# Patient Record
Sex: Male | Born: 1935 | Race: White | Hispanic: No | Smoking: Former smoker
Health system: Southern US, Community
[De-identification: ages and names within clinical notes are randomized; demographics above are authoritative.]

## PROBLEM LIST (undated history)

## (undated) DIAGNOSIS — I209 Angina pectoris, unspecified: Secondary | ICD-10-CM

## (undated) DIAGNOSIS — E119 Type 2 diabetes mellitus without complications: Secondary | ICD-10-CM

## (undated) DIAGNOSIS — T8859XA Other complications of anesthesia, initial encounter: Secondary | ICD-10-CM

## (undated) DIAGNOSIS — I38 Endocarditis, valve unspecified: Secondary | ICD-10-CM

## (undated) DIAGNOSIS — J189 Pneumonia, unspecified organism: Secondary | ICD-10-CM

## (undated) DIAGNOSIS — M1990 Unspecified osteoarthritis, unspecified site: Secondary | ICD-10-CM

## (undated) DIAGNOSIS — T4145XA Adverse effect of unspecified anesthetic, initial encounter: Secondary | ICD-10-CM

## (undated) DIAGNOSIS — L97509 Non-pressure chronic ulcer of other part of unspecified foot with unspecified severity: Secondary | ICD-10-CM

## (undated) DIAGNOSIS — I10 Essential (primary) hypertension: Secondary | ICD-10-CM

## (undated) DIAGNOSIS — D509 Iron deficiency anemia, unspecified: Secondary | ICD-10-CM

## (undated) DIAGNOSIS — Z9289 Personal history of other medical treatment: Secondary | ICD-10-CM

## (undated) DIAGNOSIS — I739 Peripheral vascular disease, unspecified: Secondary | ICD-10-CM

## (undated) DIAGNOSIS — E11621 Type 2 diabetes mellitus with foot ulcer: Secondary | ICD-10-CM

## (undated) DIAGNOSIS — E785 Hyperlipidemia, unspecified: Secondary | ICD-10-CM

## (undated) DIAGNOSIS — K219 Gastro-esophageal reflux disease without esophagitis: Secondary | ICD-10-CM

## (undated) HISTORY — DX: Essential (primary) hypertension: I10

## (undated) HISTORY — PX: CARDIAC VALVE REPLACEMENT: SHX585

## (undated) HISTORY — PX: OTHER SURGICAL HISTORY: SHX169

## (undated) HISTORY — DX: Gastro-esophageal reflux disease without esophagitis: K21.9

## (undated) HISTORY — PX: KNEE ARTHROSCOPY: SUR90

## (undated) HISTORY — DX: Endocarditis, valve unspecified: I38

## (undated) HISTORY — DX: Hyperlipidemia, unspecified: E78.5

## (undated) HISTORY — PX: AORTIC VALVE REPLACEMENT: SHX41

---

## 1997-09-24 ENCOUNTER — Encounter: Admission: RE | Admit: 1997-09-24 | Discharge: 1997-12-23 | Payer: Self-pay | Admitting: Internal Medicine

## 1998-03-21 ENCOUNTER — Encounter: Admission: RE | Admit: 1998-03-21 | Discharge: 1998-03-27 | Payer: Self-pay | Admitting: Internal Medicine

## 2004-09-28 ENCOUNTER — Ambulatory Visit: Payer: Self-pay | Admitting: Internal Medicine

## 2004-10-27 ENCOUNTER — Ambulatory Visit: Payer: Self-pay | Admitting: Internal Medicine

## 2004-11-03 ENCOUNTER — Ambulatory Visit: Payer: Self-pay | Admitting: Internal Medicine

## 2004-12-02 ENCOUNTER — Ambulatory Visit: Payer: Self-pay | Admitting: Gastroenterology

## 2004-12-14 ENCOUNTER — Ambulatory Visit: Payer: Self-pay | Admitting: Gastroenterology

## 2004-12-17 ENCOUNTER — Ambulatory Visit: Payer: Self-pay | Admitting: Gastroenterology

## 2005-05-14 ENCOUNTER — Ambulatory Visit: Payer: Self-pay | Admitting: Internal Medicine

## 2005-05-21 ENCOUNTER — Ambulatory Visit: Payer: Self-pay | Admitting: Internal Medicine

## 2005-08-16 ENCOUNTER — Ambulatory Visit: Payer: Self-pay | Admitting: Internal Medicine

## 2005-11-16 ENCOUNTER — Ambulatory Visit: Payer: Self-pay | Admitting: Internal Medicine

## 2006-03-04 ENCOUNTER — Ambulatory Visit: Payer: Self-pay | Admitting: Internal Medicine

## 2006-03-04 LAB — CONVERTED CEMR LAB
ALT: 15 units/L (ref 0–40)
AST: 24 units/L (ref 0–37)
Albumin: 3.2 g/dL — ABNORMAL LOW (ref 3.5–5.2)
Alkaline Phosphatase: 84 units/L (ref 39–117)
BUN: 15 mg/dL (ref 6–23)
Bilirubin, Direct: 0.2 mg/dL (ref 0.0–0.3)
CO2: 27 meq/L (ref 19–32)
Calcium: 10 mg/dL (ref 8.4–10.5)
Chloride: 105 meq/L (ref 96–112)
Chol/HDL Ratio, serum: 4.9
Cholesterol: 134 mg/dL (ref 0–200)
Creatinine, Ser: 1.1 mg/dL (ref 0.4–1.5)
GFR calc non Af Amer: 68 mL/min
Glomerular Filtration Rate, Af Am: 83 mL/min/{1.73_m2}
Glucose, Bld: 115 mg/dL — ABNORMAL HIGH (ref 70–99)
HDL: 27.5 mg/dL — ABNORMAL LOW (ref >39.0)
Hgb A1c MFr Bld: 8.3 % — ABNORMAL HIGH (ref 4.6–6.0)
LDL Cholesterol: 83 mg/dL (ref 0–99)
Potassium: 4.8 meq/L (ref 3.5–5.1)
Sodium: 140 meq/L (ref 135–145)
Total Bilirubin: 0.7 mg/dL (ref 0.3–1.2)
Total Protein: 6.7 g/dL (ref 6.0–8.3)
Triglyceride fasting, serum: 117 mg/dL (ref 0–149)
VLDL: 23 mg/dL (ref 0–40)

## 2006-03-14 ENCOUNTER — Encounter: Payer: Self-pay | Admitting: Cardiology

## 2006-03-14 ENCOUNTER — Ambulatory Visit: Payer: Self-pay

## 2006-06-17 ENCOUNTER — Ambulatory Visit: Payer: Self-pay | Admitting: Internal Medicine

## 2006-06-17 LAB — CONVERTED CEMR LAB
ALT: 16 units/L (ref 0–40)
AST: 24 units/L (ref 0–37)
Albumin: 3.3 g/dL — ABNORMAL LOW (ref 3.5–5.2)
Alkaline Phosphatase: 67 units/L (ref 39–117)
BUN: 16 mg/dL (ref 6–23)
Bilirubin, Direct: 0.1 mg/dL (ref 0.0–0.3)
CO2: 32 meq/L (ref 19–32)
Calcium: 9.3 mg/dL (ref 8.4–10.5)
Chloride: 105 meq/L (ref 96–112)
Cholesterol: 130 mg/dL (ref 0–200)
Creatinine, Ser: 0.9 mg/dL (ref 0.4–1.5)
Direct LDL: 57.7 mg/dL
GFR calc Af Amer: 104 mL/min
GFR calc non Af Amer: 86 mL/min
Glucose, Bld: 189 mg/dL — ABNORMAL HIGH (ref 70–99)
HDL: 26.8 mg/dL — ABNORMAL LOW (ref >39.0)
Hgb A1c MFr Bld: 7.2 % — ABNORMAL HIGH (ref 4.6–6.0)
Potassium: 4.7 meq/L (ref 3.5–5.1)
Sodium: 141 meq/L (ref 135–145)
Total Bilirubin: 0.6 mg/dL (ref 0.3–1.2)
Total CHOL/HDL Ratio: 4.9
Total Protein: 6.2 g/dL (ref 6.0–8.3)
Triglycerides: 242 mg/dL (ref 0–149)
VLDL: 48 mg/dL — ABNORMAL HIGH (ref 0–40)

## 2007-01-25 ENCOUNTER — Ambulatory Visit: Payer: Self-pay | Admitting: Internal Medicine

## 2007-01-25 DIAGNOSIS — I10 Essential (primary) hypertension: Secondary | ICD-10-CM | POA: Insufficient documentation

## 2007-01-25 DIAGNOSIS — E119 Type 2 diabetes mellitus without complications: Secondary | ICD-10-CM | POA: Insufficient documentation

## 2007-01-25 DIAGNOSIS — Z954 Presence of other heart-valve replacement: Secondary | ICD-10-CM | POA: Insufficient documentation

## 2007-01-29 DIAGNOSIS — K219 Gastro-esophageal reflux disease without esophagitis: Secondary | ICD-10-CM

## 2007-02-08 LAB — CONVERTED CEMR LAB
ALT: 15 units/L (ref 0–53)
AST: 25 units/L (ref 0–37)
Albumin: 3.6 g/dL (ref 3.5–5.2)
Alkaline Phosphatase: 64 units/L (ref 39–117)
BUN: 28 mg/dL — ABNORMAL HIGH (ref 6–23)
Bilirubin, Direct: 0.1 mg/dL (ref 0.0–0.3)
CO2: 29 meq/L (ref 19–32)
Calcium: 10.1 mg/dL (ref 8.4–10.5)
Chloride: 110 meq/L (ref 96–112)
Cholesterol: 125 mg/dL (ref 0–200)
Creatinine, Ser: 1.1 mg/dL (ref 0.4–1.5)
GFR calc Af Amer: 82 mL/min
GFR calc non Af Amer: 68 mL/min
Glucose, Bld: 114 mg/dL — ABNORMAL HIGH (ref 70–99)
HDL: 21.8 mg/dL — ABNORMAL LOW (ref >39.0)
Hgb A1c MFr Bld: 7.1 % — ABNORMAL HIGH (ref 4.6–6.0)
LDL Cholesterol: 74 mg/dL (ref 0–99)
Potassium: 4.7 meq/L (ref 3.5–5.1)
Sodium: 147 meq/L — ABNORMAL HIGH (ref 135–145)
TSH: 2.28 microintl units/mL (ref 0.35–5.50)
Total Bilirubin: 0.6 mg/dL (ref 0.3–1.2)
Total CHOL/HDL Ratio: 5.7
Total Protein: 6.7 g/dL (ref 6.0–8.3)
Triglycerides: 144 mg/dL (ref 0–149)
VLDL: 29 mg/dL (ref 0–40)

## 2007-10-13 ENCOUNTER — Ambulatory Visit: Payer: Self-pay | Admitting: Internal Medicine

## 2007-10-18 LAB — CONVERTED CEMR LAB
AST: 23 units/L (ref 0–37)
Calcium: 9.4 mg/dL (ref 8.4–10.5)
Creatinine, Ser: 1 mg/dL (ref 0.4–1.5)
GFR calc Af Amer: 92 mL/min
GFR calc non Af Amer: 76 mL/min
HDL: 23.1 mg/dL — ABNORMAL LOW (ref >39.0)
Hgb A1c MFr Bld: 6.9 % — ABNORMAL HIGH (ref 4.6–6.0)
LDL Cholesterol: 59 mg/dL (ref 0–99)
Sodium: 139 meq/L (ref 135–145)
Total CHOL/HDL Ratio: 5.1
Triglycerides: 176 mg/dL — ABNORMAL HIGH (ref 0–149)
VLDL: 35 mg/dL (ref 0–40)

## 2007-12-12 ENCOUNTER — Telehealth: Payer: Self-pay | Admitting: Internal Medicine

## 2008-01-25 ENCOUNTER — Telehealth: Payer: Self-pay | Admitting: Internal Medicine

## 2008-02-26 ENCOUNTER — Ambulatory Visit: Payer: Self-pay | Admitting: Internal Medicine

## 2008-02-26 DIAGNOSIS — E785 Hyperlipidemia, unspecified: Secondary | ICD-10-CM

## 2008-02-27 LAB — CONVERTED CEMR LAB
AST: 26 units/L (ref 0–37)
CO2: 27 meq/L (ref 19–32)
Chloride: 109 meq/L (ref 96–112)
Creatinine, Ser: 0.8 mg/dL (ref 0.4–1.5)
GFR calc non Af Amer: 98 mL/min
Hgb A1c MFr Bld: 6.4 % — ABNORMAL HIGH (ref 4.6–6.0)
LDL Cholesterol: 77 mg/dL (ref 0–99)
Sodium: 144 meq/L (ref 135–145)
Total CHOL/HDL Ratio: 4.8
Triglycerides: 128 mg/dL (ref 0–149)

## 2008-04-01 ENCOUNTER — Encounter: Payer: Self-pay | Admitting: Internal Medicine

## 2008-07-01 ENCOUNTER — Ambulatory Visit: Payer: Self-pay | Admitting: Internal Medicine

## 2008-07-01 LAB — HM COLONOSCOPY

## 2008-07-02 LAB — CONVERTED CEMR LAB
ALT: 17 units/L (ref 0–53)
CO2: 30 meq/L (ref 19–32)
Calcium: 9.2 mg/dL (ref 8.4–10.5)
Creatinine, Ser: 1 mg/dL (ref 0.4–1.5)
GFR calc non Af Amer: 75.63 mL/min (ref >60)
Glucose, Bld: 138 mg/dL — ABNORMAL HIGH (ref 70–99)
HDL: 25.4 mg/dL — ABNORMAL LOW (ref >39.00)
Hgb A1c MFr Bld: 7.5 % — ABNORMAL HIGH (ref 4.6–6.5)
Sodium: 143 meq/L (ref 135–145)
Total CHOL/HDL Ratio: 5
Triglycerides: 181 mg/dL — ABNORMAL HIGH (ref 0.0–149.0)

## 2008-10-31 ENCOUNTER — Ambulatory Visit: Payer: Self-pay | Admitting: Internal Medicine

## 2008-11-06 LAB — CONVERTED CEMR LAB
ALT: 15 units/L (ref 0–53)
AST: 26 units/L (ref 0–37)
Albumin: 3.6 g/dL (ref 3.5–5.2)
HDL: 24.7 mg/dL — ABNORMAL LOW (ref >39.00)
Hgb A1c MFr Bld: 7.6 % — ABNORMAL HIGH (ref 4.6–6.5)
TSH: 1.87 microintl units/mL (ref 0.35–5.50)
Total Protein: 6.8 g/dL (ref 6.0–8.3)
Triglycerides: 113 mg/dL (ref 0.0–149.0)
VLDL: 22.6 mg/dL (ref 0.0–40.0)

## 2009-01-28 ENCOUNTER — Encounter (INDEPENDENT_AMBULATORY_CARE_PROVIDER_SITE_OTHER): Payer: Self-pay | Admitting: *Deleted

## 2009-02-21 ENCOUNTER — Encounter (INDEPENDENT_AMBULATORY_CARE_PROVIDER_SITE_OTHER): Payer: Self-pay | Admitting: *Deleted

## 2009-02-24 ENCOUNTER — Encounter (INDEPENDENT_AMBULATORY_CARE_PROVIDER_SITE_OTHER): Payer: Self-pay | Admitting: *Deleted

## 2009-02-24 ENCOUNTER — Ambulatory Visit: Payer: Self-pay | Admitting: Internal Medicine

## 2009-02-24 ENCOUNTER — Telehealth: Payer: Self-pay | Admitting: Internal Medicine

## 2009-03-17 ENCOUNTER — Encounter: Payer: Self-pay | Admitting: Internal Medicine

## 2009-03-17 LAB — CONVERTED CEMR LAB
BUN: 35 mg/dL — ABNORMAL HIGH (ref 6–23)
Basophils Relative: 0.7 % (ref 0.0–3.0)
Calcium: 9.6 mg/dL (ref 8.4–10.5)
Cholesterol: 131 mg/dL (ref 0–200)
Creatinine, Ser: 1.4 mg/dL (ref 0.4–1.5)
Direct LDL: 70.2 mg/dL
Eosinophils Relative: 2.3 % (ref 0.0–5.0)
GFR calc non Af Amer: 51.21 mL/min (ref >60)
Glucose, Bld: 110 mg/dL — ABNORMAL HIGH (ref 70–99)
HCT: 29.4 % — ABNORMAL LOW (ref 39.0–52.0)
Hemoglobin: 9.8 g/dL — ABNORMAL LOW (ref 13.0–17.0)
MCV: 96.7 fL (ref 78.0–100.0)
Monocytes Absolute: 0.3 10*3/uL (ref 0.1–1.0)
Neutro Abs: 1.9 10*3/uL (ref 1.4–7.7)
Neutrophils Relative %: 56.2 % (ref 43.0–77.0)
RBC: 3.04 M/uL — ABNORMAL LOW (ref 4.22–5.81)
VLDL: 43.8 mg/dL — ABNORMAL HIGH (ref 0.0–40.0)
WBC: 3.3 10*3/uL — ABNORMAL LOW (ref 4.5–10.5)

## 2009-03-19 ENCOUNTER — Telehealth: Payer: Self-pay | Admitting: Internal Medicine

## 2009-06-27 ENCOUNTER — Ambulatory Visit: Payer: Self-pay | Admitting: Internal Medicine

## 2009-07-01 LAB — CONVERTED CEMR LAB
ALT: 14 units/L (ref 0–53)
CO2: 27 meq/L (ref 19–32)
Calcium: 9.4 mg/dL (ref 8.4–10.5)
GFR calc non Af Amer: 67.59 mL/min (ref >60)
HDL: 29.2 mg/dL — ABNORMAL LOW (ref >39.00)
LDL Cholesterol: 70 mg/dL (ref 0–99)
Sodium: 140 meq/L (ref 135–145)
TSH: 2.22 microintl units/mL (ref 0.35–5.50)
Total Bilirubin: 0.6 mg/dL (ref 0.3–1.2)
Total Protein: 6.7 g/dL (ref 6.0–8.3)
VLDL: 26.8 mg/dL (ref 0.0–40.0)

## 2009-09-26 ENCOUNTER — Ambulatory Visit: Payer: Self-pay | Admitting: Internal Medicine

## 2009-09-30 LAB — CONVERTED CEMR LAB
Calcium: 9.2 mg/dL (ref 8.4–10.5)
GFR calc non Af Amer: 70.5 mL/min (ref >60)
HDL: 23.6 mg/dL — ABNORMAL LOW (ref >39.00)
Hgb A1c MFr Bld: 8.4 % — ABNORMAL HIGH (ref 4.6–6.5)
Sodium: 140 meq/L (ref 135–145)
Triglycerides: 140 mg/dL (ref 0.0–149.0)

## 2009-12-30 ENCOUNTER — Telehealth: Payer: Self-pay | Admitting: Internal Medicine

## 2010-01-30 ENCOUNTER — Ambulatory Visit: Payer: Self-pay | Admitting: Internal Medicine

## 2010-02-02 LAB — CONVERTED CEMR LAB
ALT: 13 units/L (ref 0–53)
CO2: 27 meq/L (ref 19–32)
Calcium: 9 mg/dL (ref 8.4–10.5)
Chloride: 104 meq/L (ref 96–112)
Eosinophils Relative: 1.8 % (ref 0.0–5.0)
Glucose, Bld: 79 mg/dL (ref 70–99)
HCT: 28.4 % — ABNORMAL LOW (ref 39.0–52.0)
HDL: 24.4 mg/dL — ABNORMAL LOW (ref >39.00)
Hemoglobin: 9.6 g/dL — ABNORMAL LOW (ref 13.0–17.0)
Lymphs Abs: 1 10*3/uL (ref 0.7–4.0)
Monocytes Relative: 9.7 % (ref 3.0–12.0)
Platelets: 74 10*3/uL — ABNORMAL LOW (ref 150.0–400.0)
Sodium: 139 meq/L (ref 135–145)
TSH: 3.3 microintl units/mL (ref 0.35–5.50)
Total Bilirubin: 0.5 mg/dL (ref 0.3–1.2)
Total CHOL/HDL Ratio: 5
VLDL: 25.8 mg/dL (ref 0.0–40.0)
WBC: 3.4 10*3/uL — ABNORMAL LOW (ref 4.5–10.5)

## 2010-04-16 NOTE — Assessment & Plan Note (Signed)
Summary: 4 MONTH FUP//CCM   Vital Signs:  Patient profile:   77 year old male Weight:      166 pounds Temp:     97.7 degrees F oral Pulse rate:   72 / minute Pulse rhythm:   regular BP sitting:   112 / 60  (left arm) Cuff size:   regular  Vitals Entered By: Alfred Levins, CMA (January 30, 2010 9:26 AM) CC: DM f/u Is Patient Diabetic? Yes Did you bring your meter with you today? No   CC:  DM f/u.  History of Present Illness:  Follow-Up Visit      This is an 77 year old man who presents for Follow-up visit.  The patient denies chest pain and palpitations.  Since the last visit the patient notes no new problems or concerns.  The patient reports taking meds as prescribed.  When questioned about possible medication side effects, the patient notes none.  not checking CBGs, not following a diet or exercise plan.  All other systems reviewed and were negative except has chronic problem with constipation.   Current Problems (verified): 1)  Hyperlipidemia  (ICD-272.4) 2)  Aortic Valve Replacement, Hx of  (ICD-V43.3) 3)  Gerd  (ICD-530.81) 4)  Hypertension  (ICD-401.9) 5)  Diabetes Mellitus, Type II  (ICD-250.00)  Current Medications (verified): 1)  Baclofen 10 Mg Tabs (Baclofen) .... Take 1 Tablet By Mouth At Bedtime 2)  Lanoxin 0.125 Mg  Tabs (Digoxin) .... Take 1 Tablet By Mouth Once A Day 3)  Glipizide 10 Mg Tabs (Glipizide) .... Take 1 Tablet By Mouth Once A Day 4)  Lasix 40 Mg Tabs (Furosemide) .... Take 1 Tablet By Mouth Once A Day 5)  Lisinopril 20 Mg Tabs (Lisinopril) .... Take 1 Tablet By Mouth Once A Day 6)  Metformin Hcl 500 Mg Tabs (Metformin Hcl) .... Take 1 Tablet By Mouth Two Times A Day 7)  Zantac 150 Mg Tabs (Ranitidine Hcl) .... Take 1 Tablet By Mouth Once A Day 8)  Terazosin Hcl 10 Mg  Caps (Terazosin Hcl) .... At Bedtime 9)  Hydrocodone-Acetaminophen 5-500 Mg Tabs (Hydrocodone-Acetaminophen) .... Take 1 Tablet By Mouth Two Times A Day As Needed 10)  Aspirin 325  Mg Tabs (Aspirin) .... Once Daily 11)  Ferrous Sulfate 325 (65 Fe) Mg Tabs (Ferrous Sulfate) .... 2 Once Daily 12)  Vitamin C 500 Mg Tabs (Ascorbic Acid) .... Once Daily 13)  Vitamin E Natural 400 Unit Caps (Vitamin E) .... 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 HEENT exam atraumatic, normocephalic supple without lymphadenopathy. Chest clear to auscultation cardiac exam S1-S2 are regular. Extremities no clubbing cyanosis or edema. Neurologic exam he is alert is oriented to name, place, date and  He anulus with a broad-based gait..   Impression & Recommendations:  Problem # 1:  HYPERLIPIDEMIA (ICD-272.4)  note low HDL Labs Reviewed: SGOT: 22 (06/27/2009)   SGPT: 15 (09/26/2009)   HDL:23.60 (09/26/2009), 29.20 (06/27/2009)  LDL:64 (09/26/2009), 70 (06/27/2009)  Chol:116 (09/26/2009), 126 (06/27/2009)  Trig:140.0 (09/26/2009), 134.0 (06/27/2009)  Orders: Venipuncture (09811)  Specimen Handling (16109) TLB-Lipid Panel (80061-LIPID) TLB-Hepatic/Liver Function Pnl (80076-HEPATIC) TLB-TSH (Thyroid Stimulating Hormone) (84443-TSH)  Problem # 2:  AORTIC VALVE REPLACEMENT, HX OF (ICD-V43.3) no eval necessary  Problem # 3:  HYPERTENSION (ICD-401.9)  controlled continue current medications  His updated medication list for this problem includes:    Lasix 40 Mg Tabs (Furosemide) .Marland Kitchen... Take 1 tablet by mouth once a day    Lisinopril 20 Mg Tabs (Lisinopril) .Marland Kitchen... Take 1 tablet  by mouth once a day    Terazosin Hcl 10 Mg Caps (Terazosin hcl) .Marland Kitchen... At bedtime  BP today: 112/60 Prior BP: 110/58 (09/26/2009)  Labs Reviewed: K+: 4.6 (09/26/2009) Creat: : 1.1 (09/26/2009)   Chol: 116 (09/26/2009)   HDL: 23.60 (09/26/2009)   LDL: 64 (09/26/2009)   TG: 140.0 (09/26/2009)  Orders: Specimen Handling (60454) TLB-BMP (Basic Metabolic Panel-BMET) (80048-METABOL)  Problem # 4:  DIABETES MELLITUS, TYPE II (ICD-250.00)  advised low CHO diet and daily walking His updated medication list for this problem includes:    Glipizide 10 Mg Tabs (Glipizide) .Marland Kitchen... Take 1 tablet by mouth once a day    Lisinopril 20 Mg Tabs (Lisinopril) .Marland Kitchen... Take 1 tablet by mouth once a day    Metformin Hcl 500 Mg Tabs (Metformin hcl) .Marland Kitchen... Take 1 tablet by mouth two times a day    Aspirin 325 Mg Tabs (Aspirin) ..... Once daily  Labs Reviewed: Creat: 1.1 (09/26/2009)     Last Eye Exam: normal-pt's report (03/15/2008) Reviewed HgBA1c results: 8.4 (09/26/2009)  6.9 (06/27/2009)  Orders: Specimen Handling (09811) TLB-A1C / Hgb A1C (Glycohemoglobin) (83036-A1C)  Complete Medication List: 1)  Baclofen 10 Mg Tabs (Baclofen) .... Take 1 tablet by mouth at bedtime 2)  Lanoxin 0.125 Mg Tabs (Digoxin) .... Take 1 tablet by mouth once a day 3)  Glipizide 10 Mg Tabs (Glipizide) .... Take 1 tablet by mouth once a day 4)  Lasix 40 Mg Tabs (Furosemide) .... Take 1 tablet by mouth once a day 5)  Lisinopril 20 Mg Tabs (Lisinopril) .... Take 1 tablet by mouth once a day 6)  Metformin Hcl 500 Mg Tabs (Metformin hcl) .... Take 1 tablet by mouth two times a day 7)  Zantac 150 Mg Tabs (Ranitidine hcl) .... Take 1 tablet by mouth once a day 8)  Terazosin Hcl 10 Mg Caps (Terazosin hcl) .... At bedtime 9)  Hydrocodone-acetaminophen 5-500 Mg Tabs (Hydrocodone-acetaminophen) .... Take 1 tablet by mouth two times a day as needed 10)  Aspirin 325 Mg Tabs (Aspirin) .... Once daily 11)  Ferrous Sulfate 325 (65 Fe) Mg  Tabs (Ferrous sulfate) .... 2 once daily 12)  Vitamin C 500 Mg Tabs (Ascorbic acid) .... Once daily 13)  Vitamin E Natural 400 Unit Caps (Vitamin e) .... Once daily  Other Orders: TLB-CBC Platelet - w/Differential (85025-CBCD)  Patient Instructions: 1)  Please schedule a follow-up appointment in 4 months.   Orders Added: 1)  Est. Patient Level IV [91478] 2)  Venipuncture [29562] 3)  Specimen Handling [99000] 4)  TLB-Lipid Panel [80061-LIPID] 5)  TLB-Hepatic/Liver Function Pnl [80076-HEPATIC] 6)  TLB-TSH (Thyroid Stimulating Hormone) [84443-TSH] 7)  TLB-CBC Platelet - w/Differential [85025-CBCD] 8)  TLB-BMP (Basic Metabolic Panel-BMET) [80048-METABOL] 9)  TLB-A1C / Hgb A1C (Glycohemoglobin) [83036-A1C]

## 2010-04-16 NOTE — Assessment & Plan Note (Signed)
Summary: 4 month rov/pt will come in fasting/njr   Vital Signs:  Patient profile:   77 year old male Weight:      173 pounds Pulse rate:   70 / minute Pulse rhythm:   regular Resp:     15 per minute BP sitting:   110 / 70  Vitals Entered By: Gladis Riffle, RN (June 27, 2009 9:54 AM)  History of Present Illness:  Follow-Up Visit      This is an 77 year old man who presents for Follow-up visit.  The patient denies chest pain and palpitations.  Since the last visit the patient notes no new problems or concerns.  The patient reports taking meds as prescribed and not taking meds as prescribed.  When questioned about possible medication side effects, the patient notes none.    All other systems reviewed and were negative   Current Problems (verified): 1)  Hyperlipidemia  (ICD-272.4) 2)  Aortic Valve Replacement, Hx of  (ICD-V43.3) 3)  Gerd  (ICD-530.81) 4)  Hypertension  (ICD-401.9) 5)  Diabetes Mellitus, Type II  (ICD-250.00)  Current Medications (verified): 1)  Baclofen 10 Mg Tabs (Baclofen) .... Take 1 Tablet By Mouth At Bedtime 2)  Lanoxin 0.125 Mg  Tabs (Digoxin) .... Take 1 Tablet By Mouth Once A Day 3)  Glipizide 10 Mg Tabs (Glipizide) .... Take 1 Tablet By Mouth Once A Day 4)  Lasix 40 Mg Tabs (Furosemide) .... Take 1 Tablet By Mouth Once A Day 5)  Lisinopril 20 Mg Tabs (Lisinopril) .... Take 1 Tablet By Mouth Once A Day 6)  Metformin Hcl 500 Mg Tabs (Metformin Hcl) .... Take 1 Tablet By Mouth Two Times A Day 7)  Zantac 150 Mg Tabs (Ranitidine Hcl) .... Take 1 Tablet By Mouth Once A Day 8)  Terazosin Hcl 10 Mg  Caps (Terazosin Hcl) .... At Bedtime 9)  Hydrocodone-Acetaminophen 5-500 Mg Tabs (Hydrocodone-Acetaminophen) .... Take 1/2 Tablet By Mouth Two Times A Day As Needed Pain 10)  Aspirin 325 Mg Tabs (Aspirin) .... Once Daily 11)  Ferrous Sulfate 325 (65 Fe) Mg Tabs (Ferrous Sulfate) .... 2 Once Daily 12)  Vitamin C 500 Mg Tabs (Ascorbic Acid) .... Once Daily 13)  Vitamin E  Natural 400 Unit Caps (Vitamin E) .... 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   Head:  normocephalic and atraumatic.   Eyes:  pupils equal and pupils round.   Ears:  R ear normal and L ear normal.   Neck:  No deformities, masses, or tenderness noted. Chest Wall:  No deformities, masses, tenderness or gynecomastia noted. Lungs:  Normal respiratory effort, chest expands symmetrically. Lungs are clear to auscultation, no crackles or wheezes. Heart:  normal rate and regular rhythm.   Abdomen:  Bowel sounds positive,abdomen soft and non-tender without masses, organomegaly or hernias noted. Msk:  No deformity or scoliosis noted of thoracic or lumbar spine.   Neurologic:  cranial nerves II-XII intact and gait normal.     Impression & Recommendations:  Problem # 1:  HYPERLIPIDEMIA (ICD-272.4)  check labs today never treated  Labs Reviewed: SGOT: 26 (10/31/2008)   SGPT: 16 (02/24/2009)   HDL:22.40 (02/24/2009), 24.70 (10/31/2008)  LDL:65 (10/31/2008), 68 (07/01/2008)  Chol:131 (02/24/2009), 112 (10/31/2008)  Trig:219.0 (02/24/2009), 113.0 (10/31/2008)  Orders: Venipuncture (60454) TLB-Lipid Panel (80061-LIPID) TLB-Hepatic/Liver Function Pnl (80076-HEPATIC) TLB-TSH (Thyroid Stimulating Hormone) (84443-TSH)  Problem # 2:  DIABETES MELLITUS, TYPE II (ICD-250.00)  continue current medications check albs His updated medication list for this problem includes:    Glipizide 10 Mg  Tabs (Glipizide) .Marland Kitchen... Take 1 tablet by mouth once a day    Lisinopril 20 Mg Tabs (Lisinopril) .Marland Kitchen... Take 1 tablet by mouth once a day    Metformin Hcl 500 Mg Tabs (Metformin hcl) .Marland Kitchen... Take 1 tablet by mouth two times a day    Aspirin 325 Mg Tabs (Aspirin) ..... Once daily  Labs Reviewed: Creat: 1.4 (02/24/2009)     Last Eye Exam: normal-pt's report (03/15/2008) Reviewed HgBA1c results: 7.3 (02/24/2009)  7.6 (10/31/2008)  Orders: TLB-BMP (Basic Metabolic Panel-BMET) (80048-METABOL) TLB-A1C / Hgb A1C (Glycohemoglobin) (83036-A1C)  Problem # 3:  GERD (ICD-530.81) controlled on meds His updated medication list for this problem includes:    Zantac 150 Mg Tabs (Ranitidine hcl) .Marland Kitchen... Take 1 tablet by mouth once a day  Complete Medication List: 1)  Baclofen 10 Mg Tabs (Baclofen) .... Take 1 tablet by mouth at bedtime 2)  Lanoxin 0.125 Mg Tabs (Digoxin) .... Take 1 tablet by mouth once a day 3)  Glipizide 10 Mg Tabs (Glipizide) .... Take 1 tablet by mouth once a day 4)  Lasix 40 Mg Tabs (Furosemide) .... Take 1 tablet by mouth once a day 5)  Lisinopril 20 Mg Tabs (Lisinopril) .... Take 1 tablet by mouth once a day 6)  Metformin Hcl 500 Mg Tabs (Metformin hcl) .... Take 1 tablet by mouth two times a day 7)  Zantac 150 Mg Tabs (Ranitidine hcl) .... Take 1 tablet by mouth once a day 8)  Terazosin Hcl 10 Mg Caps (Terazosin hcl) .... At bedtime 9)  Hydrocodone-acetaminophen 5-500 Mg Tabs (Hydrocodone-acetaminophen) .... Take 1/2 tablet by mouth two times a day as needed pain 10)  Aspirin 325 Mg Tabs (Aspirin) .... Once daily 11)  Ferrous Sulfate 325 (65 Fe) Mg Tabs (Ferrous sulfate) .... 2 once daily 12)  Vitamin C 500 Mg Tabs (Ascorbic acid) .... Once daily 13)  Vitamin E Natural 400 Unit Caps (Vitamin e) .... Once daily  Patient Instructions: 1)  Please schedule a follow-up appointment in 3 months.

## 2010-04-16 NOTE — Progress Notes (Signed)
Summary: clarify sig  Phone Note From Pharmacy Call back at [phone]   Summary of Call: terozosin Caller: Charles @ [facility] in [place] Summary of Call: received escript for Terozosin 10mg  cap #100 . Verify how many at bedtime? Initial call taken by: Warnell Forester,  December 30, 2009 3:30 PM  Follow-up for Phone Call        Returned pharmacy call. Follow-up by: Lynann Beaver CMA,  December 30, 2009 3:42 PM

## 2010-04-16 NOTE — Progress Notes (Signed)
Summary: REQ FOR MED  Phone Note Call from Patient   Caller: Daughter-n-law @ [phone] Reason for Call: Talk to Nurse, Talk to Doctor Summary of Call: Pts dghtr-n-lw Bethann Berkshire) called to req that alternative to Darvocet be called sent in to [facility] in [place], [place] @ [phone].... Mrs dilon lank that any questions or concerns, she can be reached @ [phone].  Also, would like to know if documents have been sent in to [facility].... [phone].  Initial call taken by: Debbra Riding,  March 19, 2009 10:59 AM  Follow-up for Phone Call        see rx Follow-up by: Birdie Sons MD,  March 19, 2009 7:36 PM  Additional Follow-up for Phone Call Additional follow up Details #1::        Rx telephoned in to [facility].  documents were sent 12/13 10.   (daughter in law)amie notified. Additional Follow-up by: Gladis Riffle, RN,  March 20, 2009 3:42 PM    New/Updated Medications: HYDROCODONE-ACETAMINOPHEN 2.5-500 MG TABS (HYDROCODONE-ACETAMINOPHEN) two times a day tab as needed pain Prescriptions: HYDROCODONE-ACETAMINOPHEN 2.5-500 MG TABS (HYDROCODONE-ACETAMINOPHEN) two times a day tab as needed pain  #30 x 3   Entered and Authorized by:   Birdie Sons MD   Signed by:   Birdie Sons MD on 03/19/2009   Method used:   Telephoned to ...       [facility] [phone]* (retail)       [address]       [place], [place]  [address]       Ph: [phone]       Fax: [phone]   RxID:   [id number]

## 2010-04-16 NOTE — Assessment & Plan Note (Signed)
Summary: 3 month fup//ccm   Vital Signs:  Patient profile:   77 year old male Height:      66 inches (167.64 cm) Weight:      167.31 pounds (76.05 kg) BMI:     27.10 Temp:     97.6 degrees F (36.44 degrees C) oral Pulse rate:   62 / minute BP sitting:   110 / 58  (left arm) Cuff size:   regular  Vitals Entered By: Josph Macho RMA (September 26, 2009 9:32 AM) CC: 3 month follow up/ CF Is Patient Diabetic? Yes   CC:  3 month follow up/ CF.  History of Present Illness:  Follow-Up Visit      This is an 77 year old man who presents for Follow-up visit.  The patient denies chest pain and palpitations.  Since the last visit the patient notes no new problems or concerns.  The patient reports taking meds as prescribed.  When questioned about possible medication side effects, the patient notes none.    All other systems reviewed and were negative   Current Problems (verified): 1)  Hyperlipidemia  (ICD-272.4) 2)  Aortic Valve Replacement, Hx of  (ICD-V43.3) 3)  Gerd  (ICD-530.81) 4)  Hypertension  (ICD-401.9) 5)  Diabetes Mellitus, Type II  (ICD-250.00)  Current Medications (verified): 1)  Baclofen 10 Mg Tabs (Baclofen) .... Take 1 Tablet By Mouth At Bedtime 2)  Lanoxin 0.125 Mg  Tabs (Digoxin) .... Take 1 Tablet By Mouth Once A Day 3)  Glipizide 10 Mg Tabs (Glipizide) .... Take 1 Tablet By Mouth Once A Day 4)  Lasix 40 Mg Tabs (Furosemide) .... Take 1 Tablet By Mouth Once A Day 5)  Lisinopril 20 Mg Tabs (Lisinopril) .... Take 1 Tablet By Mouth Once A Day 6)  Metformin Hcl 500 Mg Tabs (Metformin Hcl) .... Take 1 Tablet By Mouth Two Times A Day 7)  Zantac 150 Mg Tabs (Ranitidine Hcl) .... Take 1 Tablet By Mouth Once A Day 8)  Terazosin Hcl 10 Mg  Caps (Terazosin Hcl) .... At Bedtime 9)  Hydrocodone-Acetaminophen 5-500 Mg Tabs (Hydrocodone-Acetaminophen) .... Take 1/2 Tablet By Mouth Two Times A Day As Needed Pain 10)  Aspirin 325 Mg Tabs (Aspirin) .... Once Daily 11)  Ferrous Sulfate  325 (65 Fe) Mg Tabs (Ferrous Sulfate) .... 2 Once Daily 12)  Vitamin C 500 Mg Tabs (Ascorbic Acid) .... Once Daily 13)  Vitamin E Natural 400 Unit Caps (Vitamin E) .... 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   Head:  normocephalic and atraumatic.   Eyes:  pupils equal and pupils round.   Ears:  R ear normal and L ear normal.   Neck:  No deformities, masses, or tenderness noted. Chest Wall:  No deformities, masses, tenderness or gynecomastia noted. Lungs:  Normal respiratory effort, chest expands symmetrically. Lungs are clear to auscultation, no crackles or wheezes. Heart:  normal rate and regular rhythm.   Abdomen:  Bowel sounds positive,abdomen soft and non-tender without masses, organomegaly or hernias noted. Skin:  turgor normal and color normal.  Psych:  memory intact for recent and remote and normally interactive.     Impression & Recommendations:  Problem # 1:  DIABETES MELLITUS, TYPE II (ICD-250.00)  His updated medication list for this problem includes:    Glipizide 10 Mg Tabs (Glipizide) .Marland Kitchen... Take 1 tablet by mouth once a day    Lisinopril 20 Mg Tabs (Lisinopril) .Marland Kitchen... Take 1 tablet by mouth once a day    Metformin Hcl 500 Mg Tabs (Metformin hcl) .Marland Kitchen... Take 1 tablet by mouth two times a day    Aspirin 325 Mg Tabs (Aspirin) ..... Once daily  Labs Reviewed: Creat: 1.1 (06/27/2009)     Last Eye Exam: normal-pt's report (03/15/2008) Reviewed HgBA1c results: 6.9  (06/27/2009)  7.3 (02/24/2009)  Orders: TLB-A1C / Hgb A1C (Glycohemoglobin) (83036-A1C) TLB-BMP (Basic Metabolic Panel-BMET) (80048-METABOL) TLB-Lipid Panel (80061-LIPID) TLB-ALT (SGPT) (84460-ALT)  Problem # 2:  AORTIC VALVE REPLACEMENT, HX OF (ICD-V43.3) clinically stable  Problem # 3:  HYPERTENSION (ICD-401.9)  His updated medication list for this problem includes:    Lasix 40 Mg Tabs (Furosemide) .Marland Kitchen... Take 1 tablet by mouth once a day    Lisinopril 20 Mg Tabs (Lisinopril) .Marland Kitchen... Take 1 tablet by mouth once a day    Terazosin Hcl 10 Mg Caps (Terazosin hcl) .Marland Kitchen... At bedtime  BP today: 110/58 Prior BP: 110/70 (06/27/2009)  Labs Reviewed: K+: 4.8 (06/27/2009) Creat: : 1.1 (06/27/2009)   Chol: 126 (06/27/2009)   HDL: 29.20 (06/27/2009)   LDL: 70 (06/27/2009)   TG: 134.0 (06/27/2009)  Problem # 4:  GERD (ICD-530.81) controlled, continue current medications  His updated medication list for this problem includes:    Zantac 150 Mg Tabs (Ranitidine hcl) .Marland Kitchen... Take 1 tablet by mouth once a day     Complete Medication List: 1)  Baclofen 10 Mg Tabs (Baclofen) .... Take 1 tablet by mouth at bedtime 2)  Lanoxin 0.125 Mg Tabs (Digoxin) .... Take 1 tablet by mouth once a day 3)  Glipizide 10 Mg Tabs (Glipizide) .... Take 1 tablet by mouth once a day 4)  Lasix 40 Mg Tabs (Furosemide) .... Take 1 tablet by mouth once a day 5)  Lisinopril 20 Mg Tabs (Lisinopril) .... Take 1 tablet by mouth once a day 6)  Metformin Hcl 500 Mg Tabs (Metformin hcl) .... Take 1 tablet by mouth two times a day 7)  Zantac 150 Mg Tabs (Ranitidine hcl) .... Take 1 tablet by mouth once a day 8)  Terazosin Hcl 10 Mg Caps (Terazosin hcl) .... At bedtime 9)  Hydrocodone-acetaminophen 5-500 Mg Tabs (Hydrocodone-acetaminophen) .... Take 1 tablet by mouth two times a day as needed 10)  Aspirin 325 Mg Tabs (Aspirin) .... Once daily 11)  Ferrous Sulfate 325 (65 Fe) Mg Tabs (Ferrous sulfate) .... 2 once daily 12)  Vitamin  C 500 Mg Tabs (Ascorbic acid) .... Once daily 13)  Vitamin E Natural 400 Unit Caps (Vitamin e) .... Once daily  Other Orders: Venipuncture (45409) Prescriptions: HYDROCODONE-ACETAMINOPHEN 5-500 MG TABS (HYDROCODONE-ACETAMINOPHEN) Take 1 tablet by mouth two times a day as needed  #20 x 3   Entered and Authorized by:   Birdie Sons MD   Signed by:   Birdie Sons MD on 09/26/2009   Method used:   Print then Give to Patient   RxID:   [id number]

## 2010-04-16 NOTE — Letter (Signed)
Summary: Generic Letter  [place] [facility]  [address] [place], [place] [address]   Phone: [phone]  Fax: [phone]    03/17/2009  Jamie Romero [address] [place], [place]  [address]  Dear [name],  Your recent lab work shows you are somewhat anemic.  Dr. Cato Mulligan would like to run additional test to help determine cause.  If it is more convenient for you to have this done closer to where you live, please contact our office to give a number for that lab so we can call in an order for the additional studies.  Also, please let us know a number where you can be reached as when we call the home number, we are told you no longer live there.           Sincerely,     Gladis Riffle, RN for [name], MD

## 2010-05-14 ENCOUNTER — Other Ambulatory Visit: Payer: Self-pay | Admitting: Internal Medicine

## 2010-05-31 ENCOUNTER — Encounter: Payer: Self-pay | Admitting: Internal Medicine

## 2010-06-01 ENCOUNTER — Encounter: Payer: Self-pay | Admitting: Internal Medicine

## 2010-06-01 ENCOUNTER — Ambulatory Visit (INDEPENDENT_AMBULATORY_CARE_PROVIDER_SITE_OTHER): Payer: Medicare Other | Admitting: Internal Medicine

## 2010-06-01 DIAGNOSIS — M170 Bilateral primary osteoarthritis of knee: Secondary | ICD-10-CM

## 2010-06-01 DIAGNOSIS — I10 Essential (primary) hypertension: Secondary | ICD-10-CM

## 2010-06-01 DIAGNOSIS — E119 Type 2 diabetes mellitus without complications: Secondary | ICD-10-CM

## 2010-06-01 DIAGNOSIS — E785 Hyperlipidemia, unspecified: Secondary | ICD-10-CM

## 2010-06-01 DIAGNOSIS — M1710 Unilateral primary osteoarthritis, unspecified knee: Secondary | ICD-10-CM

## 2010-06-01 LAB — LIPID PANEL
HDL: 25.5 mg/dL — ABNORMAL LOW (ref >39.00)
LDL Cholesterol: 72 mg/dL (ref 0–99)
Total CHOL/HDL Ratio: 4
VLDL: 15.6 mg/dL (ref 0.0–40.0)

## 2010-06-01 LAB — BASIC METABOLIC PANEL
CO2: 28 mEq/L (ref 19–32)
Calcium: 8.7 mg/dL (ref 8.4–10.5)
Chloride: 107 mEq/L (ref 96–112)
Glucose, Bld: 89 mg/dL (ref 70–99)
Sodium: 140 mEq/L (ref 135–145)

## 2010-06-01 LAB — HEPATIC FUNCTION PANEL
AST: 22 U/L (ref 0–37)
Albumin: 3.5 g/dL (ref 3.5–5.2)
Total Bilirubin: 0.6 mg/dL (ref 0.3–1.2)

## 2010-06-01 LAB — HEMOGLOBIN A1C: Hgb A1c MFr Bld: 7.2 % — ABNORMAL HIGH (ref 4.6–6.5)

## 2010-06-01 NOTE — Assessment & Plan Note (Signed)
Prevents him from walking even short distances Has had long term motorized wheelchair---needs a new one

## 2010-06-01 NOTE — Assessment & Plan Note (Signed)
Needs labs, check today

## 2010-06-01 NOTE — Assessment & Plan Note (Signed)
Previously controlled Check labs today 

## 2010-06-01 NOTE — Assessment & Plan Note (Signed)
adquate control Continue curent meds

## 2010-06-01 NOTE — Progress Notes (Signed)
  Subjective:    Patient ID: Jamie Romero, male    DOB: September 14, 1922, 77 y.o.   MRN: [id number]  HPI   patient comes in for followup of multiple medical problems including type 2 diabetes, hyperlipidemia, hypertension. The patient does not check blood sugar or blood pressure at home. The patetient does not follow an exercise or diet program. The patient denies any polyuria, polydipsia.  In the past the patient has gone to diabetic treatment center. The patient is tolerating medications  Without difficulty. The patient does admit to medication compliance.   Pt complains of significant bilateral leg pain---prevents him from walking even short distances. He has a presumed diagnosis of OA of both knees. He has a motorized wheelchair--- needs a new one because of malfunction.    Past Medical History  Diagnosis Date  . Valvular heart disease   . Diabetes mellitus   . GERD (gastroesophageal reflux disease)   . Hypertension   . Hyperlipidemia    Past Surgical History  Procedure Date  . Aortic valve replacement     porcine  . Trauma with skull fracture   . Knee surgery     reports that he quit smoking about 25 years ago. His smokeless tobacco use includes Chew. His alcohol and drug histories not on file. family history includes Cancer in his brother and sister and Diabetes in his sister. No Known Allergies   Review of Systems  patient denies chest pain, shortness of breath, orthopnea. Denies lower extremity edema, abdominal pain, change in appetite, change in bowel movements. Patient denies rashes, musculoskeletal complaints. No other specific complaints in a complete review of systems.      Objective:   Physical Exam  well-developed well-nourished male in no acute distress. HEENT exam atraumatic, normocephalic, neck supple without jugular venous distention. Chest clear to auscultation cardiac exam S1-S2 are regular. Abdominal exam overweight with bowel sounds, soft and nontender.  Extremities no edema. Neurologic exam is alert with a broad based gait        Assessment & Plan:

## 2010-06-22 ENCOUNTER — Other Ambulatory Visit: Payer: Self-pay | Admitting: Internal Medicine

## 2010-06-22 DIAGNOSIS — R52 Pain, unspecified: Secondary | ICD-10-CM

## 2010-06-30 ENCOUNTER — Telehealth: Payer: Self-pay | Admitting: Internal Medicine

## 2010-06-30 NOTE — Telephone Encounter (Signed)
Hilda Lias called with [employer] and said that she spoke with Arline Asp, Dr Cato Mulligan nurse yesterday. Need additional info re: power chair, in order to process pts order.

## 2010-07-02 NOTE — Telephone Encounter (Signed)
LMTCB

## 2010-07-24 ENCOUNTER — Other Ambulatory Visit: Payer: Self-pay | Admitting: Internal Medicine

## 2010-08-03 ENCOUNTER — Other Ambulatory Visit: Payer: Self-pay | Admitting: *Deleted

## 2010-08-03 MED ORDER — TERAZOSIN HCL 10 MG PO CAPS: 10.0000 mg | ORAL_CAPSULE | Freq: Every day | ORAL | Status: DC

## 2010-08-24 ENCOUNTER — Other Ambulatory Visit: Payer: Self-pay | Admitting: *Deleted

## 2010-09-15 ENCOUNTER — Other Ambulatory Visit: Payer: Self-pay | Admitting: Internal Medicine

## 2010-09-28 ENCOUNTER — Ambulatory Visit: Payer: Medicare Other | Admitting: Internal Medicine

## 2010-10-19 ENCOUNTER — Other Ambulatory Visit: Payer: Self-pay | Admitting: Internal Medicine

## 2010-11-17 ENCOUNTER — Other Ambulatory Visit: Payer: Self-pay | Admitting: Internal Medicine

## 2010-11-23 ENCOUNTER — Other Ambulatory Visit: Payer: Self-pay | Admitting: Internal Medicine

## 2010-12-21 ENCOUNTER — Other Ambulatory Visit: Payer: Self-pay | Admitting: Internal Medicine

## 2011-02-15 ENCOUNTER — Other Ambulatory Visit: Payer: Self-pay | Admitting: Internal Medicine

## 2011-02-16 ENCOUNTER — Other Ambulatory Visit: Payer: Self-pay | Admitting: Internal Medicine

## 2011-02-26 ENCOUNTER — Ambulatory Visit (INDEPENDENT_AMBULATORY_CARE_PROVIDER_SITE_OTHER): Payer: Medicare Other | Admitting: Internal Medicine

## 2011-02-26 ENCOUNTER — Encounter: Payer: Self-pay | Admitting: Internal Medicine

## 2011-02-26 VITALS — BP 110/70 | HR 72 | Temp 97.5°F | Wt 169.0 lb

## 2011-02-26 DIAGNOSIS — Z23 Encounter for immunization: Secondary | ICD-10-CM

## 2011-02-26 DIAGNOSIS — I10 Essential (primary) hypertension: Secondary | ICD-10-CM

## 2011-02-26 DIAGNOSIS — E785 Hyperlipidemia, unspecified: Secondary | ICD-10-CM

## 2011-02-26 DIAGNOSIS — E119 Type 2 diabetes mellitus without complications: Secondary | ICD-10-CM

## 2011-02-26 DIAGNOSIS — K219 Gastro-esophageal reflux disease without esophagitis: Secondary | ICD-10-CM

## 2011-02-26 LAB — HEPATIC FUNCTION PANEL
Alkaline Phosphatase: 77 U/L (ref 39–117)
Bilirubin, Direct: 0.1 mg/dL (ref 0.0–0.3)
Total Bilirubin: 0.7 mg/dL (ref 0.3–1.2)
Total Protein: 6.6 g/dL (ref 6.0–8.3)

## 2011-02-26 LAB — BASIC METABOLIC PANEL
Calcium: 9.2 mg/dL (ref 8.4–10.5)
Creatinine, Ser: 1.2 mg/dL (ref 0.4–1.5)
GFR: 60.31 mL/min (ref >60.00)

## 2011-02-26 LAB — LIPID PANEL
HDL: 33.7 mg/dL — ABNORMAL LOW (ref >39.00)
LDL Cholesterol: 81 mg/dL (ref 0–99)
Total CHOL/HDL Ratio: 4
VLDL: 17.6 mg/dL (ref 0.0–40.0)

## 2011-02-26 LAB — HEMOGLOBIN A1C: Hgb A1c MFr Bld: 6.4 % (ref 4.6–6.5)

## 2011-02-26 NOTE — Progress Notes (Signed)
Subjective:    Patient ID: Jamie Romero, male    DOB: 12-19-1922, 77 y.o.   MRN: [id number]  HPI   patient comes in for followup of multiple medical problems including type 2 diabetes, hyperlipidemia, hypertension. The patient does not check blood sugar or blood pressure at home. The patetient does not follow an exercise or diet program. The patient denies any polyuria, polydipsia.  In the past the patient has gone to diabetic treatment center. The patient is tolerating medications  Without difficulty. The patient does admit to medication compliance.  Past Medical History  Diagnosis Date  . Valvular heart disease   . Diabetes mellitus   . GERD (gastroesophageal reflux disease)   . Hypertension   . Hyperlipidemia     History   Social History  . Marital Status: Widowed    Spouse Name: N/A    Number of Children: N/A  . Years of Education: N/A   Occupational History  . Not on file.   Social History Main Topics  . Smoking status: Former Smoker    Quit date: 05/31/1985  . Smokeless tobacco: Current User    Types: Chew  . Alcohol Use: Not on file  . Drug Use: Not on file  . Sexually Active: Not on file   Other Topics Concern  . Not on file   Social History Narrative  . No narrative on file    Past Surgical History  Procedure Date  . Aortic valve replacement     porcine  . Trauma with skull fracture   . Knee surgery     Family History  Problem Relation Age of Onset  . Diabetes Sister   . Cancer Sister   . Cancer Brother     No Known Allergies  Current Outpatient Prescriptions on File Prior to Visit  Medication Sig Dispense Refill  . Ascorbic Acid (VITAMIN C) 500 MG tablet Take 500 mg by mouth daily.        Marland Kitchen aspirin 325 MG tablet Take 325 mg by mouth daily.        . baclofen (LIORESAL) 10 MG tablet TAKE ONE TABLET BY MOUTH AT BEDTIME  45 each  2  . digoxin (LANOXIN) 0.125 MG tablet TAKE ONE TABLET BY MOUTH EVERY DAY  30 tablet  5  . ferrous gluconate  (FERGON) 325 MG tablet Take 325 mg by mouth daily with breakfast.        . furosemide (LASIX) 40 MG tablet TAKE ONE TABLET BY MOUTH EVERY DAY  30 tablet  5  . glimepiride (AMARYL) 4 MG tablet Take 4 mg by mouth daily before breakfast.        . glipiZIDE (GLUCOTROL) 10 MG tablet TAKE ONE TABLET BY MOUTH EVERY DAY  30 tablet  5  . HYDROcodone-acetaminophen (VICODIN) 5-500 MG per tablet TAKE ONE TABLET BY MOUTH TWICE DAILY AS NEEDED FOR PAIN  20 tablet  1  . lisinopril (PRINIVIL,ZESTRIL) 20 MG tablet TAKE ONE-HALF TABLET BY MOUTH IN THE MORNING  30 tablet  5  . metFORMIN (GLUCOPHAGE) 500 MG tablet TAKE TWO TABLETS BY MOUTH EVERY DAY  60 tablet  5  . ranitidine (ZANTAC) 150 MG tablet TAKE ONE TABLET BY MOUTH EVERY DAY  30 tablet  5  . terazosin (HYTRIN) 10 MG capsule TAKE ONE CAPSULE BY MOUTH AT BEDTIME  90 capsule  0  . vitamin E 400 UNIT capsule Take 400 Units by mouth daily.         Review of  Systems  patient denies chest pain, shortness of breath, orthopnea. Denies lower extremity edema, abdominal pain, change in appetite, change in bowel movements. Patient denies rashes, musculoskeletal complaints. No other specific complaints in a complete review of systems.     Objective:   Physical Exam  BP 110/70  Pulse 72  Temp(Src) 97.5 F (36.4 C) (Oral)  Wt 169 lb (76.658 kg)  well-developed well-nourished male in no acute distress. HEENT exam atraumatic, normocephalic, neck supple without jugular venous distention. Chest clear to auscultation cardiac exam S1-S2 are regular. Abdominal exam overweight with bowel sounds, soft and nontender. Extremities no edema. Neurologic exam is alert with a normal gait.    Assessment & Plan:

## 2011-02-26 NOTE — Assessment & Plan Note (Signed)
No sxs on H2 blocker

## 2011-02-26 NOTE — Assessment & Plan Note (Signed)
Needs labs today

## 2011-02-26 NOTE — Assessment & Plan Note (Signed)
No home cbgs Will check labs today Written Rx for DM shoes

## 2011-02-26 NOTE — Assessment & Plan Note (Signed)
BP Readings from Last 3 Encounters:  02/26/11 110/70  06/01/10 126/74  01/30/10 112/60   Controlled, continue curent meds

## 2011-03-16 HISTORY — PX: PENECTOMY: SHX741

## 2011-04-20 ENCOUNTER — Other Ambulatory Visit: Payer: Self-pay | Admitting: Internal Medicine

## 2011-04-21 ENCOUNTER — Other Ambulatory Visit: Payer: Self-pay | Admitting: Internal Medicine

## 2011-04-23 ENCOUNTER — Encounter: Payer: Self-pay | Admitting: Family Medicine

## 2011-04-23 ENCOUNTER — Other Ambulatory Visit: Payer: Self-pay | Admitting: Internal Medicine

## 2011-04-23 ENCOUNTER — Ambulatory Visit (INDEPENDENT_AMBULATORY_CARE_PROVIDER_SITE_OTHER): Payer: Medicare Other | Admitting: Family Medicine

## 2011-04-23 VITALS — BP 140/62 | Temp 98.6°F | Wt 170.0 lb

## 2011-04-23 DIAGNOSIS — L97509 Non-pressure chronic ulcer of other part of unspecified foot with unspecified severity: Secondary | ICD-10-CM

## 2011-04-23 DIAGNOSIS — L03119 Cellulitis of unspecified part of limb: Secondary | ICD-10-CM

## 2011-04-23 DIAGNOSIS — E1169 Type 2 diabetes mellitus with other specified complication: Secondary | ICD-10-CM

## 2011-04-23 DIAGNOSIS — E119 Type 2 diabetes mellitus without complications: Secondary | ICD-10-CM

## 2011-04-23 DIAGNOSIS — E11621 Type 2 diabetes mellitus with foot ulcer: Secondary | ICD-10-CM

## 2011-04-23 DIAGNOSIS — L02619 Cutaneous abscess of unspecified foot: Secondary | ICD-10-CM

## 2011-04-23 MED ORDER — AMOXICILLIN-POT CLAVULANATE 875-125 MG PO TABS: 1.0000 | ORAL_TABLET | Freq: Two times a day (BID) | ORAL | Status: AC

## 2011-04-23 NOTE — Patient Instructions (Signed)
It is very important that he get established with wound care center near your home as soon as possible Start antibiotic promptly today Change dressing daily Avoid pressure against wound Followup promptly for any fever, chills or increased redness or swelling of wound Return Monday here for reevaluation if we are unable to get him into wound care center sooner

## 2011-04-23 NOTE — Progress Notes (Signed)
  Subjective:    Patient ID: Jamie Romero, male    DOB: 1923/02/10, 78 y.o.   MRN: [id number]  HPI  Acute visit. Patient has type 2 diabetes. Daughter-in-law noted couple days ago right heel ulcer. No reported injury. Patient does have some neuropathy and he is not noted any problems. Last hemoglobin A1c 6.4%. And noticed some swelling of the foot and mild erythema. No fever or chills. Denies prior history of diabetic wounds. Other medical problems include history of aortic valve replacement, hypertension, hyperlipidemia.  Past Medical History  Diagnosis Date  . Valvular heart disease   . Diabetes mellitus   . GERD (gastroesophageal reflux disease)   . Hypertension   . Hyperlipidemia    Past Surgical History  Procedure Date  . Aortic valve replacement     porcine  . Trauma with skull fracture   . Knee surgery     reports that he quit smoking about 25 years ago. His smokeless tobacco use includes Chew. His alcohol and drug histories not on file. family history includes Cancer in his brother and sister and Diabetes in his sister. No Known Allergies    Review of Systems  Constitutional: Negative for fever and chills.  Respiratory: Negative for shortness of breath.   Cardiovascular: Negative for chest pain.  Hematological: Negative for adenopathy.  Psychiatric/Behavioral: Negative for confusion.       Objective:   Physical Exam  Constitutional: He appears well-developed and well-nourished.  Cardiovascular: Normal rate and regular rhythm.   Pulmonary/Chest: Effort normal and breath sounds normal. No respiratory distress. He has no wheezes. He has no rales.  Musculoskeletal:       Patient has some edema right leg and foot and family states his right leg edema is chronic. Feet are warm to touch with excellent capillary fill. 2+ dorsalis pedis pulse right foot  Skin:       Patient has 2.5 x 2.5cm wound right medial aspect of heel. There is slight foul odor. He has some  necrotic tissue noted the center. He has approximately 3 x 4 cm area of surrounding erythema. He does have impaired sensation but some sensation present in this area. We debrided some necrotic tissue with surgical scissors as well as #15 blade.          Assessment & Plan:  Diabetic right foot wound with secondary cellulitis and some necrosis as above. Debridement as above. Keep pressure off this area. Establish with wound care center closer to their home and will try to get in as soon as possible. Start Augmentin 875 mg twice daily. Wound was dressed today and daily  dressing changes.  Wound culture sent.

## 2011-04-29 NOTE — Progress Notes (Signed)
Quick Note:  Family did not have a fax number, they requested the report be mailed to their home and they will bring to appt next Monday ______

## 2011-07-16 ENCOUNTER — Ambulatory Visit: Payer: Medicare Other | Admitting: Internal Medicine

## 2011-08-03 ENCOUNTER — Other Ambulatory Visit: Payer: Self-pay | Admitting: *Deleted

## 2011-08-03 DIAGNOSIS — K219 Gastro-esophageal reflux disease without esophagitis: Secondary | ICD-10-CM

## 2011-08-03 DIAGNOSIS — E785 Hyperlipidemia, unspecified: Secondary | ICD-10-CM

## 2011-08-03 DIAGNOSIS — E119 Type 2 diabetes mellitus without complications: Secondary | ICD-10-CM

## 2011-08-03 DIAGNOSIS — I10 Essential (primary) hypertension: Secondary | ICD-10-CM

## 2011-08-03 MED ORDER — ATORVASTATIN CALCIUM 10 MG PO TABS: 10.0000 mg | ORAL_TABLET | Freq: Every day | ORAL | Status: DC

## 2011-08-03 MED ORDER — GLIPIZIDE 10 MG PO TABS: 10.0000 mg | ORAL_TABLET | Freq: Every day | ORAL | Status: DC

## 2011-08-03 MED ORDER — TERAZOSIN HCL 10 MG PO CAPS: 10.0000 mg | ORAL_CAPSULE | Freq: Every day | ORAL | Status: DC

## 2011-08-03 MED ORDER — RANOLAZINE ER 500 MG PO TB12: 500.0000 mg | ORAL_TABLET | Freq: Two times a day (BID) | ORAL | Status: DC

## 2011-08-03 MED ORDER — LISINOPRIL 5 MG PO TABS: 5.0000 mg | ORAL_TABLET | Freq: Every day | ORAL | Status: DC

## 2011-08-03 MED ORDER — DIGOXIN 125 MCG PO TABS: 125.0000 ug | ORAL_TABLET | Freq: Every day | ORAL | Status: DC

## 2011-08-03 MED ORDER — ZINC SULFATE 220 (50 ZN) MG PO CAPS: 220.0000 mg | ORAL_CAPSULE | Freq: Every day | ORAL | Status: DC

## 2011-08-03 MED ORDER — FERROUS SULFATE 325 (65 FE) MG PO TABS: 325.0000 mg | ORAL_TABLET | Freq: Two times a day (BID) | ORAL | Status: DC

## 2011-08-03 MED ORDER — FUROSEMIDE 20 MG PO TABS: 20.0000 mg | ORAL_TABLET | Freq: Every day | ORAL | Status: DC

## 2011-08-13 ENCOUNTER — Ambulatory Visit (INDEPENDENT_AMBULATORY_CARE_PROVIDER_SITE_OTHER): Payer: Medicare Other | Admitting: Internal Medicine

## 2011-08-13 ENCOUNTER — Encounter: Payer: Self-pay | Admitting: Internal Medicine

## 2011-08-13 VITALS — BP 128/64 | HR 72 | Temp 98.3°F | Wt 170.0 lb

## 2011-08-13 DIAGNOSIS — E119 Type 2 diabetes mellitus without complications: Secondary | ICD-10-CM

## 2011-08-13 DIAGNOSIS — I10 Essential (primary) hypertension: Secondary | ICD-10-CM

## 2011-08-13 DIAGNOSIS — E1169 Type 2 diabetes mellitus with other specified complication: Secondary | ICD-10-CM

## 2011-08-13 DIAGNOSIS — E785 Hyperlipidemia, unspecified: Secondary | ICD-10-CM

## 2011-08-13 DIAGNOSIS — L97509 Non-pressure chronic ulcer of other part of unspecified foot with unspecified severity: Secondary | ICD-10-CM

## 2011-08-13 DIAGNOSIS — Z23 Encounter for immunization: Secondary | ICD-10-CM

## 2011-08-13 DIAGNOSIS — E11621 Type 2 diabetes mellitus with foot ulcer: Secondary | ICD-10-CM

## 2011-08-13 LAB — BASIC METABOLIC PANEL
BUN: 16 mg/dL (ref 6–23)
Calcium: 8.7 mg/dL (ref 8.4–10.5)
Creatinine, Ser: 1.1 mg/dL (ref 0.4–1.5)
GFR: 70.19 mL/min (ref >60.00)
Glucose, Bld: 79 mg/dL (ref 70–99)
Potassium: 5.6 mEq/L — ABNORMAL HIGH (ref 3.5–5.1)

## 2011-08-13 LAB — LIPID PANEL
Cholesterol: 91 mg/dL (ref 0–200)
HDL: 35.4 mg/dL — ABNORMAL LOW (ref >39.00)
LDL Cholesterol: 44 mg/dL (ref 0–99)
Triglycerides: 60 mg/dL (ref 0.0–149.0)
VLDL: 12 mg/dL (ref 0.0–40.0)

## 2011-08-13 LAB — HEPATIC FUNCTION PANEL
Albumin: 2.9 g/dL — ABNORMAL LOW (ref 3.5–5.2)
Total Bilirubin: 0.7 mg/dL (ref 0.3–1.2)

## 2011-08-13 NOTE — Assessment & Plan Note (Signed)
Will check labs today Continue f/u with wound clinic

## 2011-08-13 NOTE — Assessment & Plan Note (Signed)
Controlled Continue same meds 

## 2011-08-13 NOTE — Progress Notes (Signed)
Patient ID: Jamie Romero, male   DOB: 1923/02/28, 78 y.o.   MRN: [id number]   patient comes in for followup of multiple medical problems including type 2 diabetes, hyperlipidemia, hypertension. The patient does not check blood sugar or blood pressure at home. Patient has a foot ulcer--- healing with help of wound care surgeon. The patient denies any polyuria, polydipsia.  In the past the patient has gone to diabetic treatment center. The patient is tolerating medications  Without difficulty. The patient does admit to medication compliance.   Past Medical History  Diagnosis Date  . Valvular heart disease   . Diabetes mellitus   . GERD (gastroesophageal reflux disease)   . Hypertension   . Hyperlipidemia     History   Social History  . Marital Status: Widowed    Spouse Name: N/A    Number of Children: N/A  . Years of Education: N/A   Occupational History  . Not on file.   Social History Main Topics  . Smoking status: Former Smoker    Quit date: 05/31/1985  . Smokeless tobacco: Current User    Types: Chew  . Alcohol Use: Not on file  . Drug Use: Not on file  . Sexually Active: Not on file   Other Topics Concern  . Not on file   Social History Narrative  . No narrative on file    Past Surgical History  Procedure Date  . Aortic valve replacement     porcine  . Trauma with skull fracture   . Knee surgery     Family History  Problem Relation Age of Onset  . Diabetes Sister   . Cancer Sister   . Cancer Brother     No Known Allergies  Current Outpatient Prescriptions on File Prior to Visit  Medication Sig Dispense Refill  . Ascorbic Acid (VITAMIN C) 500 MG tablet Take 500 mg by mouth daily.        Marland Kitchen aspirin 325 MG tablet Take 325 mg by mouth daily.        Marland Kitchen atorvastatin (LIPITOR) 10 MG tablet Take 1 tablet (10 mg total) by mouth daily.  30 tablet  5  . baclofen (LIORESAL) 10 MG tablet TAKE ONE TABLET BY MOUTH AT BEDTIME  45 each  2  . digoxin (LANOXIN) 0.125  MG tablet Take 1 tablet (125 mcg total) by mouth daily.  30 tablet  5  . ferrous sulfate 325 (65 FE) MG tablet Take 1 tablet (325 mg total) by mouth 2 (two) times daily.  60 tablet  5  . furosemide (LASIX) 20 MG tablet Take 1 tablet (20 mg total) by mouth daily.  30 tablet  5  . glimepiride (AMARYL) 4 MG tablet Take 4 mg by mouth daily before breakfast.        . glipiZIDE (GLUCOTROL) 10 MG tablet Take 1 tablet (10 mg total) by mouth daily.  30 tablet  5  . HYDROcodone-acetaminophen (VICODIN) 5-500 MG per tablet TAKE ONE TABLET BY MOUTH TWICE DAILY AS NEEDED FOR PAIN MUST LAST 30 DAYS  20 tablet  2  . lisinopril (PRINIVIL,ZESTRIL) 5 MG tablet Take 1 tablet (5 mg total) by mouth daily.  30 tablet  5  . ranitidine (ZANTAC) 150 MG tablet TAKE ONE TABLET BY MOUTH EVERY DAY  30 tablet  5  . ranolazine (RANEXA) 500 MG 12 hr tablet Take 1 tablet (500 mg total) by mouth 2 (two) times daily.  60 tablet  5  . terazosin (  HYTRIN) 10 MG capsule Take 1 capsule (10 mg total) by mouth at bedtime.  30 capsule  5  . vitamin E 400 UNIT capsule Take 400 Units by mouth daily.        Marland Kitchen zinc sulfate (ZINCATE) 220 MG capsule Take 1 capsule (220 mg total) by mouth daily.  30 capsule  5     patient denies chest pain, shortness of breath, orthopnea. Denies lower extremity edema, abdominal pain, change in appetite, change in bowel movements. Patient denies rashes, musculoskeletal complaints. No other specific complaints in a complete review of systems.   BP 128/64  Pulse 72  Temp(Src) 98.3 F (36.8 C) (Oral)  Wt 170 lb (77.111 kg) elderly male in no acute distress. HEENT exam atraumatic, normocephalic, neck supple without jugular venous distention. Chest clear to auscultation cardiac exam S1-S2 are regular. Abdominal exam overweight with bowel sounds, soft and nontender. Extremities no edema. Neurologic exam is alert with a normal gait. Large , healing wound right foot.

## 2011-08-24 ENCOUNTER — Other Ambulatory Visit (INDEPENDENT_AMBULATORY_CARE_PROVIDER_SITE_OTHER): Payer: Medicare Other

## 2011-08-24 DIAGNOSIS — E119 Type 2 diabetes mellitus without complications: Secondary | ICD-10-CM

## 2011-08-24 LAB — CBC WITH DIFFERENTIAL/PLATELET
Eosinophils Absolute: 0.1 10*3/uL (ref 0.0–0.7)
Eosinophils Relative: 1.5 % (ref 0.0–5.0)
HCT: 23.3 % — CL (ref 39.0–52.0)
Lymphs Abs: 0.5 10*3/uL — ABNORMAL LOW (ref 0.7–4.0)
MCHC: 32.8 g/dL (ref 30.0–36.0)
MCV: 90 fl (ref 78.0–100.0)
Monocytes Absolute: 0.4 10*3/uL (ref 0.1–1.0)
Neutrophils Relative %: 76.7 % (ref 43.0–77.0)
Platelets: 121 10*3/uL — ABNORMAL LOW (ref 150.0–400.0)
WBC: 4.4 10*3/uL — ABNORMAL LOW (ref 4.5–10.5)

## 2011-10-04 ENCOUNTER — Other Ambulatory Visit: Payer: Self-pay | Admitting: *Deleted

## 2011-10-04 MED ORDER — ZINC SULFATE 220 (50 ZN) MG PO CAPS: 220.0000 mg | ORAL_CAPSULE | Freq: Every day | ORAL | Status: DC

## 2011-11-04 ENCOUNTER — Telehealth: Payer: Self-pay | Admitting: Internal Medicine

## 2011-11-04 NOTE — Telephone Encounter (Signed)
Per Dr Artist Pais we need to call 911 and have EMS dispatched out there.  Vassie Moselle, RN to notify pt and made sure they were home.  I called [facility] and EMS was dispatched

## 2011-11-04 NOTE — Telephone Encounter (Signed)
Caller: Nancy/Patient; Phone: [phone]; Reason for Call: Please call Harriett Sine as soon as possible, pt temp has spiked to 104.4 and appears to be viral; pt refuses to go out to be seen.

## 2011-11-04 NOTE — Telephone Encounter (Signed)
Opened in error

## 2011-11-14 DIAGNOSIS — J189 Pneumonia, unspecified organism: Secondary | ICD-10-CM

## 2011-11-14 HISTORY — DX: Pneumonia, unspecified organism: J18.9

## 2011-11-22 ENCOUNTER — Telehealth: Payer: Self-pay | Admitting: Internal Medicine

## 2011-11-22 NOTE — Telephone Encounter (Signed)
Need to know if Beneprotein can be changed to another protein, and also need a substitute for the  Linezolid abx.   Pt is not eating and not urinating very much. Pt has a diabetic ulcer on heel that is draining and dampening the gauze.

## 2011-11-22 NOTE — Telephone Encounter (Signed)
Left message on machine of home health nursing advising of Dr. Cato Mulligan instructions to send Jamie Romero to hospital if he is not tolerating the oral antibiotics.  Jamie Romero will need to have IV antibiotics administered and find out if IV antibiotics can be continued at home after discharge.

## 2011-11-22 NOTE — Telephone Encounter (Signed)
Pt was just d/c from hospital for sepsis and pneumonia.  He is taking linezolid or zyrox and is not tolerating well. Please advise

## 2011-11-23 NOTE — Telephone Encounter (Signed)
Called Richard, RN to verify that he got the message and he stated he did and relayed to the pt to go to ED.  Richard, RN is going to f/u with pt this morning to see if he went to the ER

## 2011-12-01 ENCOUNTER — Ambulatory Visit: Payer: Medicare Other | Admitting: Family

## 2011-12-06 ENCOUNTER — Encounter: Payer: Self-pay | Admitting: Family

## 2011-12-06 ENCOUNTER — Ambulatory Visit (INDEPENDENT_AMBULATORY_CARE_PROVIDER_SITE_OTHER): Payer: Medicare Other | Admitting: Family

## 2011-12-06 ENCOUNTER — Telehealth: Payer: Self-pay | Admitting: Internal Medicine

## 2011-12-06 VITALS — BP 108/50 | HR 87 | Temp 98.2°F | Wt 170.0 lb

## 2011-12-06 DIAGNOSIS — L97519 Non-pressure chronic ulcer of other part of right foot with unspecified severity: Secondary | ICD-10-CM

## 2011-12-06 DIAGNOSIS — L89609 Pressure ulcer of unspecified heel, unspecified stage: Secondary | ICD-10-CM

## 2011-12-06 DIAGNOSIS — Z23 Encounter for immunization: Secondary | ICD-10-CM

## 2011-12-06 DIAGNOSIS — E119 Type 2 diabetes mellitus without complications: Secondary | ICD-10-CM

## 2011-12-06 DIAGNOSIS — L97509 Non-pressure chronic ulcer of other part of unspecified foot with unspecified severity: Secondary | ICD-10-CM

## 2011-12-06 DIAGNOSIS — E785 Hyperlipidemia, unspecified: Secondary | ICD-10-CM

## 2011-12-06 DIAGNOSIS — L8993 Pressure ulcer of unspecified site, stage 3: Secondary | ICD-10-CM

## 2011-12-06 LAB — HEMOGLOBIN A1C: Hgb A1c MFr Bld: 6.4 % (ref 4.6–6.5)

## 2011-12-06 MED ORDER — HYDROCODONE-ACETAMINOPHEN 10-325 MG PO TABS: 1.0000 | ORAL_TABLET | Freq: Three times a day (TID) | ORAL | Status: DC | PRN

## 2011-12-06 MED ORDER — DOXYCYCLINE HYCLATE 100 MG PO TABS: 100.0000 mg | ORAL_TABLET | Freq: Two times a day (BID) | ORAL | Status: DC

## 2011-12-06 NOTE — Telephone Encounter (Signed)
Need to get an order to submit a wound culture for sentivity. Need asap. Trying to send culture to [facility].  Fax # [phone] attn Joslyn Devon.

## 2011-12-06 NOTE — Progress Notes (Signed)
Subjective:    Patient ID: Jamie Romero, male    DOB: Sep 16, 1922, 78 y.o.   MRN: [id number]  HPI 78 year old white male, tobacco chewer, patient of Dr. Cato Mulligan is in for a ED follow-up. He was seen at a hospital in [facility]. For evaluation of an ulcer on his right heel that has been present x 9 months but worsening. He has had it cultured which showed staph bacteria. He was later seen 4 days ago and in ED in [place] who started Cephalexin three times a day x 8 weeks. Today, the ulcer is no better. Continues to be nonhealing and more red. How health has been applying xeroform guaze to right heal and wrapping with Kerlix. He has pain in his heal that is worse, 7/10 and vicodin is not helping. He had an appointment to see the wound care center several months ago but was unable to make it. He has a history of type 2 diabetes. Last A1C was 5.9 Does not check his blood sugars routinely. Patient was also treated for a UTI at the ED.    Review of Systems  HENT: Negative.   Respiratory: Negative.   Cardiovascular: Negative.   Gastrointestinal: Negative.   Genitourinary: Negative.   Musculoskeletal: Negative.        In a wheelchair but ambulates with a limp  Skin: Positive for wound.       Deep ulcer to the right heel.   Neurological: Negative.   Hematological: Negative.   Psychiatric/Behavioral: Negative.    Past Medical History  Diagnosis Date  . Valvular heart disease   . Diabetes mellitus   . GERD (gastroesophageal reflux disease)   . Hypertension   . Hyperlipidemia     History   Social History  . Marital Status: Widowed    Spouse Name: N/A    Number of Children: N/A  . Years of Education: N/A   Occupational History  . Not on file.   Social History Main Topics  . Smoking status: Former Smoker    Quit date: 05/31/1985  . Smokeless tobacco: Current User    Types: Chew  . Alcohol Use: Not on file  . Drug Use: Not on file  . Sexually Active: Not on file   Other Topics  Concern  . Not on file   Social History Narrative  . No narrative on file    Past Surgical History  Procedure Date  . Aortic valve replacement     porcine  . Trauma with skull fracture   . Knee surgery     Family History  Problem Relation Age of Onset  . Diabetes Sister   . Cancer Sister   . Cancer Brother     No Known Allergies  Current Outpatient Prescriptions on File Prior to Visit  Medication Sig Dispense Refill  . Ascorbic Acid (VITAMIN C) 500 MG tablet Take 500 mg by mouth daily.        Marland Kitchen aspirin 325 MG tablet Take 325 mg by mouth daily.        Marland Kitchen atorvastatin (LIPITOR) 10 MG tablet Take 1 tablet (10 mg total) by mouth daily.  30 tablet  5  . baclofen (LIORESAL) 10 MG tablet TAKE ONE TABLET BY MOUTH AT BEDTIME  45 each  2  . digoxin (LANOXIN) 0.125 MG tablet Take 1 tablet (125 mcg total) by mouth daily.  30 tablet  5  . ferrous sulfate 325 (65 FE) MG tablet Take 1 tablet (325 mg  total) by mouth 2 (two) times daily.  60 tablet  5  . furosemide (LASIX) 20 MG tablet Take 1 tablet (20 mg total) by mouth daily.  30 tablet  5  . glimepiride (AMARYL) 4 MG tablet Take 4 mg by mouth daily before breakfast.        . glipiZIDE (GLUCOTROL) 10 MG tablet Take 1 tablet (10 mg total) by mouth daily.  30 tablet  5  . lisinopril (PRINIVIL,ZESTRIL) 5 MG tablet Take 1 tablet (5 mg total) by mouth daily.  30 tablet  5  . ranitidine (ZANTAC) 150 MG tablet TAKE ONE TABLET BY MOUTH EVERY DAY  30 tablet  5  . ranolazine (RANEXA) 500 MG 12 hr tablet Take 1 tablet (500 mg total) by mouth 2 (two) times daily.  60 tablet  5  . terazosin (HYTRIN) 10 MG capsule Take 1 capsule (10 mg total) by mouth at bedtime.  30 capsule  5  . vitamin E 400 UNIT capsule Take 400 Units by mouth daily.        Marland Kitchen zinc sulfate (ZINCATE) 220 MG capsule Take 1 capsule (220 mg total) by mouth daily.  30 capsule  5    BP 108/50  Pulse 87  Temp 98.2 F (36.8 C) (Oral)  Wt 170 lb (77.111 kg)  SpO2 93%chart      Objective:   Physical Exam  Constitutional: He is oriented to person, place, and time. He appears well-developed and well-nourished.  HENT:  Right Ear: External ear normal.  Left Ear: External ear normal.  Nose: Nose normal.  Neck: Normal range of motion. Neck supple.  Cardiovascular: Normal rate, regular rhythm and normal heart sounds.   Pulmonary/Chest: Effort normal and breath sounds normal.  Abdominal: Soft. Bowel sounds are normal.  Musculoskeletal: Normal range of motion.  Neurological: He is alert and oriented to person, place, and time.  Skin:          Stage 3 ulceration noted to the right heel. Bloody drainage noted. Tissue inflamed and red but no necrosis. Anterior aspect of the foot also red. Tender to touch. Wound Appox 4cm by 3cm with 1cm depth.   Psychiatric: He has a normal mood and affect.          Assessment & Plan:  Assessment: Type 2 diabtes, Stage 3 foot ulceration to the left heel, Hypertension, Hyperlipidemia, Tobacco Abuse  Plan: Refer to the wound care clinic ASAP. Wet to dry dressing to the left foot twice daily. Tobacco cessation. Doxycycline 100mg  BID x 2 weeks. D/c cephalexin. Patient to call the office if symptoms worsen or persist. Recheck as scheduled;ed and sooner as needed.

## 2011-12-07 ENCOUNTER — Telehealth: Payer: Self-pay | Admitting: Family Medicine

## 2011-12-07 ENCOUNTER — Other Ambulatory Visit: Payer: Self-pay | Admitting: Family

## 2011-12-07 LAB — LIPID PANEL
Cholesterol: 53 mg/dL (ref 0–200)
HDL: 18.2 mg/dL — ABNORMAL LOW (ref >39.00)

## 2011-12-07 LAB — BASIC METABOLIC PANEL
BUN: 34 mg/dL — ABNORMAL HIGH (ref 6–23)
Calcium: 8.3 mg/dL — ABNORMAL LOW (ref 8.4–10.5)
GFR: 44.91 mL/min — ABNORMAL LOW (ref >60.00)
Glucose, Bld: 288 mg/dL — ABNORMAL HIGH (ref 70–99)
Potassium: 5.9 mEq/L — ABNORMAL HIGH (ref 3.5–5.1)
Sodium: 135 mEq/L (ref 135–145)

## 2011-12-07 MED ORDER — SODIUM POLYSTYRENE SULFONATE PO POWD: Freq: Once | ORAL | Status: DC

## 2011-12-07 NOTE — Telephone Encounter (Signed)
Order faxed.

## 2011-12-07 NOTE — Telephone Encounter (Signed)
Pharmacy calling about rx for sodium polystyrene (KAYEXALATE) powder They have the liquid - Kionex. Comes 15g/38mL. Is liquid ok, and if so, what dose? Please call. Thank you.

## 2011-12-08 NOTE — Telephone Encounter (Signed)
Pharmacy aware

## 2011-12-08 NOTE — Telephone Encounter (Signed)
Liquid ok--- same dose

## 2011-12-13 ENCOUNTER — Encounter (HOSPITAL_BASED_OUTPATIENT_CLINIC_OR_DEPARTMENT_OTHER): Payer: Medicare Other | Attending: General Surgery

## 2011-12-13 DIAGNOSIS — I10 Essential (primary) hypertension: Secondary | ICD-10-CM | POA: Insufficient documentation

## 2011-12-13 DIAGNOSIS — E785 Hyperlipidemia, unspecified: Secondary | ICD-10-CM | POA: Insufficient documentation

## 2011-12-13 DIAGNOSIS — L8990 Pressure ulcer of unspecified site, unspecified stage: Secondary | ICD-10-CM | POA: Insufficient documentation

## 2011-12-13 DIAGNOSIS — J4489 Other specified chronic obstructive pulmonary disease: Secondary | ICD-10-CM | POA: Insufficient documentation

## 2011-12-13 DIAGNOSIS — E119 Type 2 diabetes mellitus without complications: Secondary | ICD-10-CM | POA: Insufficient documentation

## 2011-12-13 DIAGNOSIS — L89609 Pressure ulcer of unspecified heel, unspecified stage: Secondary | ICD-10-CM | POA: Insufficient documentation

## 2011-12-13 DIAGNOSIS — J449 Chronic obstructive pulmonary disease, unspecified: Secondary | ICD-10-CM | POA: Insufficient documentation

## 2011-12-13 DIAGNOSIS — Z79899 Other long term (current) drug therapy: Secondary | ICD-10-CM | POA: Insufficient documentation

## 2011-12-14 NOTE — Progress Notes (Signed)
[facility]  NAME:  Jamie Romero, Jamie Romero NO.:  [id number]  MEDICAL RECORD NO.:  [id number]      DATE OF BIRTH:  1923-03-15  PHYSICIAN:  Ardath Sax, M.D.           VISIT DATE:                                  OFFICE VISIT   This is an 78 year old man who is alert mentally, but really physically very debilitated.  He comes to [place] in a wheelchair with a history of having heart problems and COPD.  He was in the hospital for couple of weeks, and during that time developed a pressure sore on his right heel. He has been on antibiotics and apparently they did an x-ray of his heel when he was in the hospital a couple weeks ago, and said that he did not have any problems with osteomyelitis at that time.  He comes to [place] with a history of an aortic valve replacement, and he has type 2 diabetes and hypertension, and a history of hyperlipidemia.  His medicines include Lasix, Amaryl, Glucotrol, lisinopril, Zantac, Ranexa, Hytrin, and Zincate along with Lipitor, and Lanoxin.  He has a blood pressure of 100/51, respirations 18, pulse 76, temperature 97.7, but the son tells [place] that he has had some temperatures of 103 at home.  The doctors had put him on doxycycline.  We got a culture of this today.  The wound is on his left heel and is really severe.  It goes right down to the bone. There is a lot of undermining.  I debrided a lot of necrotic skin and fascia and subcu tissue away.  We will get an Ortho consult.  They have requested not to see Dr. __________ because the son had his leg amputated by him, so we will send him to the orthopedic clinic, however. In the meantime, we will pack this wound with alginate and a heel pad placed to protect it from further problems.  I really think that this is going to come to a below-knee amputation.  I do not think it is feasible to put him through a lot to try to save this with multiple debridements, HBO, so we will get an  Ortho consult.  We will find out results of his x- rays and keep him on doxycycline and packed this with silver alginate.     Ardath Sax, M.D.     PP/MEDQ  D:  12/13/2011  T:  12/13/2011  Job:  [id number]

## 2011-12-15 ENCOUNTER — Encounter (HOSPITAL_COMMUNITY): Payer: Self-pay | Admitting: *Deleted

## 2011-12-15 ENCOUNTER — Inpatient Hospital Stay (HOSPITAL_COMMUNITY)
Admission: EM | Admit: 2011-12-15 | Discharge: 2011-12-20 | DRG: 617 | Disposition: A | Discharge status: Rehabilitation Facility | Payer: Medicare Other | Attending: Internal Medicine | Admitting: Internal Medicine

## 2011-12-15 ENCOUNTER — Emergency Department (HOSPITAL_COMMUNITY): Payer: Medicare Other

## 2011-12-15 DIAGNOSIS — Z7982 Long term (current) use of aspirin: Secondary | ICD-10-CM

## 2011-12-15 DIAGNOSIS — E119 Type 2 diabetes mellitus without complications: Secondary | ICD-10-CM | POA: Diagnosis present

## 2011-12-15 DIAGNOSIS — T361X5A Adverse effect of cephalosporins and other beta-lactam antibiotics, initial encounter: Secondary | ICD-10-CM | POA: Diagnosis present

## 2011-12-15 DIAGNOSIS — E1149 Type 2 diabetes mellitus with other diabetic neurological complication: Secondary | ICD-10-CM | POA: Diagnosis present

## 2011-12-15 DIAGNOSIS — M869 Osteomyelitis, unspecified: Secondary | ICD-10-CM | POA: Diagnosis present

## 2011-12-15 DIAGNOSIS — L97509 Non-pressure chronic ulcer of other part of unspecified foot with unspecified severity: Secondary | ICD-10-CM | POA: Diagnosis present

## 2011-12-15 DIAGNOSIS — D638 Anemia in other chronic diseases classified elsewhere: Secondary | ICD-10-CM | POA: Diagnosis present

## 2011-12-15 DIAGNOSIS — K219 Gastro-esophageal reflux disease without esophagitis: Secondary | ICD-10-CM | POA: Diagnosis present

## 2011-12-15 DIAGNOSIS — I10 Essential (primary) hypertension: Secondary | ICD-10-CM | POA: Diagnosis present

## 2011-12-15 DIAGNOSIS — L270 Generalized skin eruption due to drugs and medicaments taken internally: Secondary | ICD-10-CM | POA: Diagnosis not present

## 2011-12-15 DIAGNOSIS — E11621 Type 2 diabetes mellitus with foot ulcer: Secondary | ICD-10-CM | POA: Diagnosis present

## 2011-12-15 DIAGNOSIS — L97409 Non-pressure chronic ulcer of unspecified heel and midfoot with unspecified severity: Secondary | ICD-10-CM | POA: Diagnosis present

## 2011-12-15 DIAGNOSIS — E1169 Type 2 diabetes mellitus with other specified complication: Principal | ICD-10-CM | POA: Diagnosis present

## 2011-12-15 DIAGNOSIS — Y9210 Unspecified residential institution as the place of occurrence of the external cause: Secondary | ICD-10-CM | POA: Diagnosis present

## 2011-12-15 DIAGNOSIS — Z833 Family history of diabetes mellitus: Secondary | ICD-10-CM

## 2011-12-15 DIAGNOSIS — D509 Iron deficiency anemia, unspecified: Secondary | ICD-10-CM | POA: Diagnosis present

## 2011-12-15 DIAGNOSIS — Z952 Presence of prosthetic heart valve: Secondary | ICD-10-CM

## 2011-12-15 DIAGNOSIS — D649 Anemia, unspecified: Secondary | ICD-10-CM

## 2011-12-15 DIAGNOSIS — Z79899 Other long term (current) drug therapy: Secondary | ICD-10-CM

## 2011-12-15 DIAGNOSIS — E1159 Type 2 diabetes mellitus with other circulatory complications: Secondary | ICD-10-CM | POA: Diagnosis present

## 2011-12-15 DIAGNOSIS — I70269 Atherosclerosis of native arteries of extremities with gangrene, unspecified extremity: Secondary | ICD-10-CM | POA: Diagnosis present

## 2011-12-15 DIAGNOSIS — M9080 Osteopathy in diseases classified elsewhere, unspecified site: Secondary | ICD-10-CM | POA: Diagnosis present

## 2011-12-15 DIAGNOSIS — R5381 Other malaise: Secondary | ICD-10-CM | POA: Diagnosis present

## 2011-12-15 DIAGNOSIS — E785 Hyperlipidemia, unspecified: Secondary | ICD-10-CM | POA: Diagnosis present

## 2011-12-15 DIAGNOSIS — IMO0002 Reserved for concepts with insufficient information to code with codable children: Secondary | ICD-10-CM

## 2011-12-15 DIAGNOSIS — R64 Cachexia: Secondary | ICD-10-CM | POA: Diagnosis present

## 2011-12-15 DIAGNOSIS — Z87891 Personal history of nicotine dependence: Secondary | ICD-10-CM

## 2011-12-15 HISTORY — DX: Type 2 diabetes mellitus with foot ulcer: E11.621

## 2011-12-15 HISTORY — DX: Unspecified osteoarthritis, unspecified site: M19.90

## 2011-12-15 HISTORY — DX: Iron deficiency anemia, unspecified: D50.9

## 2011-12-15 HISTORY — DX: Other complications of anesthesia, initial encounter: T88.59XA

## 2011-12-15 HISTORY — DX: Type 2 diabetes mellitus without complications: E11.9

## 2011-12-15 HISTORY — DX: Angina pectoris, unspecified: I20.9

## 2011-12-15 HISTORY — DX: Adverse effect of unspecified anesthetic, initial encounter: T41.45XA

## 2011-12-15 HISTORY — DX: Personal history of other medical treatment: Z92.89

## 2011-12-15 HISTORY — DX: Non-pressure chronic ulcer of other part of unspecified foot with unspecified severity: L97.509

## 2011-12-15 HISTORY — DX: Pneumonia, unspecified organism: J18.9

## 2011-12-15 HISTORY — DX: Peripheral vascular disease, unspecified: I73.9

## 2011-12-15 LAB — URINALYSIS, ROUTINE W REFLEX MICROSCOPIC
Ketones, ur: NEGATIVE mg/dL
Leukocytes, UA: NEGATIVE
Nitrite: NEGATIVE
Specific Gravity, Urine: 1.016 (ref 1.005–1.030)
pH: 5 (ref 5.0–8.0)

## 2011-12-15 LAB — COMPREHENSIVE METABOLIC PANEL
BUN: 27 mg/dL — ABNORMAL HIGH (ref 6–23)
Calcium: 9.6 mg/dL (ref 8.4–10.5)
Creatinine, Ser: 1.27 mg/dL (ref 0.50–1.35)
GFR calc Af Amer: 57 mL/min — ABNORMAL LOW (ref >90)
Glucose, Bld: 182 mg/dL — ABNORMAL HIGH (ref 70–99)
Total Protein: 6.4 g/dL (ref 6.0–8.3)

## 2011-12-15 LAB — PREPARE RBC (CROSSMATCH)

## 2011-12-15 LAB — CBC WITH DIFFERENTIAL/PLATELET
Eosinophils Absolute: 0 10*3/uL (ref 0.0–0.7)
Eosinophils Relative: 0 % (ref 0–5)
Hemoglobin: 6.8 g/dL — CL (ref 13.0–17.0)
Lymphs Abs: 0.6 10*3/uL — ABNORMAL LOW (ref 0.7–4.0)
MCH: 25.5 pg — ABNORMAL LOW (ref 26.0–34.0)
MCV: 81.3 fL (ref 78.0–100.0)
Monocytes Relative: 7 % (ref 3–12)
RBC: 2.67 MIL/uL — ABNORMAL LOW (ref 4.22–5.81)

## 2011-12-15 MED ORDER — DIGOXIN 125 MCG PO TABS
125.0000 ug | ORAL_TABLET | Freq: Every day | ORAL | Status: DC
  Administered 2011-12-18 – 2011-12-20 (×3): 125 ug via ORAL
  Filled 2011-12-15 (×5): qty 1

## 2011-12-15 MED ORDER — SODIUM CHLORIDE 0.9 % IV SOLN
INTRAVENOUS | Status: DC
  Administered 2011-12-15 – 2011-12-16 (×2): via INTRAVENOUS

## 2011-12-15 MED ORDER — SODIUM CHLORIDE 0.9 % IJ SOLN: 3.0000 mL | Freq: Two times a day (BID) | INTRAMUSCULAR | Status: DC

## 2011-12-15 MED ORDER — VANCOMYCIN HCL IN DEXTROSE 1-5 GM/200ML-% IV SOLN
1000.0000 mg | Freq: Once | INTRAVENOUS | Status: AC
Start: 2011-12-15 — End: 2011-12-16
  Administered 2011-12-15: 1000 mg via INTRAVENOUS
  Filled 2011-12-15: qty 200

## 2011-12-15 MED ORDER — MORPHINE SULFATE 2 MG/ML IJ SOLN
2.0000 mg | Freq: Once | INTRAMUSCULAR | Status: AC
  Administered 2011-12-15: 2 mg via INTRAVENOUS
  Filled 2011-12-15: qty 1

## 2011-12-15 MED ORDER — FUROSEMIDE 20 MG PO TABS
20.0000 mg | ORAL_TABLET | Freq: Every day | ORAL | Status: DC
  Filled 2011-12-15: qty 1

## 2011-12-15 MED ORDER — FERROUS SULFATE 325 (65 FE) MG PO TABS
325.0000 mg | ORAL_TABLET | Freq: Two times a day (BID) | ORAL | Status: DC
  Administered 2011-12-16 – 2011-12-20 (×9): 325 mg via ORAL
  Filled 2011-12-15 (×12): qty 1

## 2011-12-15 MED ORDER — ASPIRIN 81 MG PO CHEW
81.0000 mg | CHEWABLE_TABLET | Freq: Every day | ORAL | Status: DC
  Administered 2011-12-17 – 2011-12-20 (×4): 81 mg via ORAL
  Filled 2011-12-15 (×6): qty 1

## 2011-12-15 MED ORDER — VANCOMYCIN HCL 1000 MG IV SOLR
750.0000 mg | Freq: Two times a day (BID) | INTRAVENOUS | Status: DC
  Administered 2011-12-16 – 2011-12-17 (×4): 750 mg via INTRAVENOUS
  Filled 2011-12-15 (×6): qty 750

## 2011-12-15 MED ORDER — GLIPIZIDE 10 MG PO TABS
10.0000 mg | ORAL_TABLET | Freq: Every day | ORAL | Status: DC
  Administered 2011-12-17 – 2011-12-20 (×3): 10 mg via ORAL
  Filled 2011-12-15 (×6): qty 1

## 2011-12-15 MED ORDER — LISINOPRIL 10 MG PO TABS
10.0000 mg | ORAL_TABLET | Freq: Every day | ORAL | Status: DC
  Filled 2011-12-15: qty 1

## 2011-12-15 MED ORDER — ATORVASTATIN CALCIUM 10 MG PO TABS
10.0000 mg | ORAL_TABLET | Freq: Every day | ORAL | Status: DC
  Administered 2011-12-17 – 2011-12-20 (×4): 10 mg via ORAL
  Filled 2011-12-15 (×5): qty 1

## 2011-12-15 MED ORDER — ADULT MULTIVITAMIN W/MINERALS CH
1.0000 | ORAL_TABLET | Freq: Every day | ORAL | Status: DC
  Administered 2011-12-16 – 2011-12-20 (×5): 1 via ORAL
  Filled 2011-12-15 (×5): qty 1

## 2011-12-15 MED ORDER — TERAZOSIN HCL 5 MG PO CAPS
10.0000 mg | ORAL_CAPSULE | Freq: Every day | ORAL | Status: DC
  Administered 2011-12-16 – 2011-12-19 (×4): 10 mg via ORAL
  Filled 2011-12-15 (×6): qty 2

## 2011-12-15 MED ORDER — VITAMIN C 500 MG PO TABS
500.0000 mg | ORAL_TABLET | Freq: Every day | ORAL | Status: DC
  Administered 2011-12-16 – 2011-12-20 (×5): 500 mg via ORAL
  Filled 2011-12-15 (×5): qty 1

## 2011-12-15 MED ORDER — INSULIN ASPART 100 UNIT/ML ~~LOC~~ SOLN
0.0000 [IU] | SUBCUTANEOUS | Status: DC
  Administered 2011-12-16: 1 [IU] via SUBCUTANEOUS
  Administered 2011-12-16: 2 [IU] via SUBCUTANEOUS

## 2011-12-15 MED ORDER — SODIUM CHLORIDE 0.9 % IJ SOLN: 3.0000 mL | INTRAMUSCULAR | Status: DC | PRN

## 2011-12-15 MED ORDER — SODIUM CHLORIDE 0.9 % IV SOLN: 250.0000 mL | INTRAVENOUS | Status: DC | PRN

## 2011-12-15 MED ORDER — DEXTROSE 5 % IV SOLN
1.0000 g | Freq: Two times a day (BID) | INTRAVENOUS | Status: DC
  Administered 2011-12-16 – 2011-12-17 (×4): 1 g via INTRAVENOUS
  Filled 2011-12-15 (×6): qty 1

## 2011-12-15 MED ORDER — ZINC SULFATE 220 (50 ZN) MG PO CAPS
220.0000 mg | ORAL_CAPSULE | Freq: Every day | ORAL | Status: DC
  Administered 2011-12-16 – 2011-12-20 (×5): 220 mg via ORAL
  Filled 2011-12-15 (×5): qty 1

## 2011-12-15 MED ORDER — CHLORHEXIDINE GLUCONATE 4 % EX LIQD
60.0000 mL | Freq: Once | CUTANEOUS | Status: AC
  Administered 2011-12-16: 4 via TOPICAL
  Filled 2011-12-15: qty 60

## 2011-12-15 MED ORDER — RANOLAZINE ER 500 MG PO TB12
500.0000 mg | ORAL_TABLET | Freq: Two times a day (BID) | ORAL | Status: DC
  Administered 2011-12-16 – 2011-12-20 (×8): 500 mg via ORAL
  Filled 2011-12-15 (×11): qty 1

## 2011-12-15 MED ORDER — VITAMIN E 180 MG (400 UNIT) PO CAPS
400.0000 [IU] | ORAL_CAPSULE | Freq: Every day | ORAL | Status: DC
  Administered 2011-12-16 – 2011-12-20 (×5): 400 [IU] via ORAL
  Filled 2011-12-15 (×5): qty 1

## 2011-12-15 NOTE — Consult Note (Signed)
Reason for Consult:  Right foot gangrene Referring Physician: Dr. Madaline Guthrie is an 78 y.o. male.  HPI: 79 y/o male with PMH of diabetes presented to clinic today upon referral from [facility] Jimmey [facility].  Pt's PCP is Dr. Cato Mulligan.  He saw a Nurse Practitioner on Friday and was started on oral doxycycline.  He has a h/o nonhealing ulcer on his R heel for 10 months.  He has recently had fever, chills and nausea per his daughter.  He c/o increasing drainage and increasing size of the heel ulcer over the last month or so.  He denies any pain in the foot.  He spends most of his time in a wheelchair and doesn't ambulate per his family.  Past Medical History  Diagnosis Date  . Valvular heart disease   . Diabetes mellitus   . GERD (gastroesophageal reflux disease)   . Hypertension   . Hyperlipidemia     Past Surgical History  Procedure Date  . Aortic valve replacement     porcine  . Trauma with skull fracture   . Knee surgery     Family History  Problem Relation Age of Onset  . Diabetes Sister   . Cancer Sister   . Cancer Brother     Social History:  reports that he quit smoking about 26 years ago. His smokeless tobacco use includes Chew. He reports that he does not drink alcohol or use illicit drugs.  Allergies: No Known Allergies  Medications:  Admission meds reviewed  Results for orders placed during the hospital encounter of 12/15/11 (from the past 48 hour(s))  CBC WITH DIFFERENTIAL     Status: Abnormal   Collection Time   12/15/11  5:28 PM      Component Value Range Comment   WBC 10.8 (*) 4.0 - 10.5 K/uL    RBC 2.67 (*) 4.22 - 5.81 MIL/uL    Hemoglobin 6.8 (*) 13.0 - 17.0 g/dL    HCT 45.4 (*) 09.8 - 52.0 %    MCV 81.3  78.0 - 100.0 fL    MCH 25.5 (*) 26.0 - 34.0 pg    MCHC 31.3  30.0 - 36.0 g/dL    RDW 11.9 (*) 14.7 - 15.5 %    Platelets 221  150 - 400 K/uL    Neutrophils Relative 86 (*) 43 - 77 %    Neutro Abs 9.3 (*) 1.7 - 7.7 K/uL    Lymphocytes Relative 6 (*) 12 - 46 %    Lymphs Abs 0.6 (*) 0.7 - 4.0 K/uL    Monocytes Relative 7  3 - 12 %    Monocytes Absolute 0.8  0.1 - 1.0 K/uL    Eosinophils Relative 0  0 - 5 %    Eosinophils Absolute 0.0  0.0 - 0.7 K/uL    Basophils Relative 0  0 - 1 %    Basophils Absolute 0.0  0.0 - 0.1 K/uL   COMPREHENSIVE METABOLIC PANEL     Status: Abnormal   Collection Time   12/15/11  5:28 PM      Component Value Range Comment   Sodium 139  135 - 145 mEq/L    Potassium 5.2 (*) 3.5 - 5.1 mEq/L    Chloride 105  96 - 112 mEq/L    CO2 24  19 - 32 mEq/L    Glucose, Bld 182 (*) 70 - 99 mg/dL    BUN 27 (*) 6 - 23 mg/dL  Creatinine, Ser 1.27  0.50 - 1.35 mg/dL    Calcium 9.6  8.4 - 16.1 mg/dL    Total Protein 6.4  6.0 - 8.3 g/dL    Albumin 2.0 (*) 3.5 - 5.2 g/dL    AST 25  0 - 37 U/L    ALT 16  0 - 53 U/L    Alkaline Phosphatase 172 (*) 39 - 117 U/L    Total Bilirubin 0.6  0.3 - 1.2 mg/dL    GFR calc non Af Amer 49 (*) >90 mL/min    GFR calc Af Amer 57 (*) >90 mL/min   LACTIC ACID, PLASMA     Status: Normal   Collection Time   12/15/11  8:10 PM      Component Value Range Comment   Lactic Acid, Venous 1.4  0.5 - 2.2 mmol/L   PREPARE RBC (CROSSMATCH)     Status: Normal   Collection Time   12/15/11  8:20 PM      Component Value Range Comment   Order Confirmation ORDER PROCESSED BY BLOOD BANK     TYPE AND SCREEN     Status: Normal (Preliminary result)   Collection Time   12/15/11  8:20 PM      Component Value Range Comment   ABO/RH(D) A POS      Antibody Screen PENDING      Sample Expiration 12/18/2011     URINALYSIS, ROUTINE W REFLEX MICROSCOPIC     Status: Abnormal   Collection Time   12/15/11  8:45 PM      Component Value Range Comment   Color, Urine YELLOW  YELLOW    APPearance CLOUDY (*) CLEAR    Specific Gravity, Urine 1.016  1.005 - 1.030    pH 5.0  5.0 - 8.0    Glucose, UA NEGATIVE  NEGATIVE mg/dL    Hgb urine dipstick NEGATIVE  NEGATIVE    Bilirubin Urine NEGATIVE   NEGATIVE    Ketones, ur NEGATIVE  NEGATIVE mg/dL    Protein, ur NEGATIVE  NEGATIVE mg/dL    Urobilinogen, UA 1.0  0.0 - 1.0 mg/dL    Nitrite NEGATIVE  NEGATIVE    Leukocytes, UA NEGATIVE  NEGATIVE MICROSCOPIC NOT DONE ON URINES WITH NEGATIVE PROTEIN, BLOOD, LEUKOCYTES, NITRITE, OR GLUCOSE <1000 mg/dL.    Dg Chest 2 View  12/15/2011  *RADIOLOGY REPORT*  Clinical Data: 78 year old male with cough.  Preoperative respiratory examination for right foot surgery.  CHEST - 2 VIEW  Comparison: None  Findings: Cardiomegaly and evidence of cardiac valve replacement noted. Mild interstitial prominence is noted. There is no evidence of focal airspace disease, pulmonary edema, suspicious pulmonary nodule/mass, pleural effusion, or pneumothorax. No acute bony abnormalities are identified.  IMPRESSION: Interstitial prominence - probably chronic but of uncertain chronicity.  Cardiomegaly without other significant abnormalities identified.   Original Report Authenticated By: Rosendo Gros, M.D.     ROS:  As above.  Recent loss of appetite and wt loss. PE:  Blood pressure 105/46, pulse 63, temperature 98.2 F (36.8 C), temperature source Oral, resp. rate 22, SpO2 100.00%. Elderly male appearing generally cachectic and unwell.  Alert.  Oriented to person.  EOMI.  Respirations unlabored.  R heel with 7 cm full thickness ulcer.  Calcaneus palpable at the wound superiorly.  Copious purulent drainage and foul smell.  Some erythema on the dorsum of the foot.  Toes with brisk ccap refill.  Sens to LT diminished at the forefoot.  5/5 strength in PF and  DF of the ankle.  No lymphadenopathy.  No lymphangiitis.  Skin at proximal leg is healthy and intact.  MRI reviewed today in clinic shows calcaneal osteomyelitis.  Plain xrays in clinic show a charcot process of the calcaneus with collapse and fragmentation.  Assessment/Plan: R foot wet gangrene, diabetic ulcer, calcaneal osteomyelitis and charcot foot - I explained the  nature of these multiple conditions to the pt and his family in detail.  Unfortunately I don't believe his foot can be salvaged.  I believe a BK amputation is necessary to treat this life threatening condition.    I spoke with Dr. Cato Mulligan' NP, and she agreed with sending the pt to the ER for evaluation and admission by [facility].  OR planned for tomorrow at 10:30.  NPO after midnight.  The patient and his family understand this plan and agree.  Dealva Lafoy 12/15/2011, 10:00 PM

## 2011-12-15 NOTE — H&P (Addendum)
[facility] History and Physical  Jamie Romero WUJ:[id number] DOB: 1922/05/06 DOA: 12/15/2011  Referring physician: Dr. Victorino Dike (Ortho) and ED PCP: Judie Petit, MD  Specialists: Dr. Victorino Dike (Ortho)  Chief Complaint: Diabetic foot ulcer  HPI: Jamie Romero is a 78 y.o. male who unfortunately has chronically been developing a worsening diabetic foot ulcer over the past 9-10 months.  Despite attempts to treat it medically it has worsened continued to be nonhealing growing in size.  After being referred to ortho surgery earlier today, Dr. Victorino Dike apparently per the daughter (who wasn't there but another family member was) ordered a radiographic study, confirmed that the infection was "eating away at his bone," apparently scheduled surgery for tomorrow and sent the patient to the ED for admission.  In the ED the patient was found to have a potassium of 5.2, a HGB of 6.6, and a mild leukocytosis among other abnormalities.  Hospitalist service has been asked to admit the patient.  Review of Systems: Patient has been febrile earlier this week (around Tuesday he thinks) does have some foot pain, otherwise 12 systems are reviewed and negative.  Past Medical History  Diagnosis Date  . Valvular heart disease   . Diabetes mellitus   . GERD (gastroesophageal reflux disease)   . Hypertension   . Hyperlipidemia    Past Surgical History  Procedure Date  . Aortic valve replacement     porcine  . Trauma with skull fracture   . Knee surgery    Social History:  reports that he quit smoking about 26 years ago. His smokeless tobacco use includes Chew. He reports that he does not drink alcohol or use illicit drugs.   No Known Allergies  Family History  Problem Relation Age of Onset  . Diabetes Sister   . Cancer Sister   . Cancer Brother     Prior to Admission medications   Medication Sig Start Date End Date Taking? Authorizing Provider  Ascorbic Acid (VITAMIN C) 500 MG tablet  Take 500 mg by mouth daily.     Yes Historical Provider, MD  aspirin 81 MG chewable tablet Chew 81 mg by mouth daily.   Yes Historical Provider, MD  atorvastatin (LIPITOR) 10 MG tablet Take 1 tablet (10 mg total) by mouth daily. 08/03/11 08/02/12 Yes Bruce Romilda Garret, MD  baclofen (LIORESAL) 10 MG tablet TAKE ONE TABLET BY MOUTH AT BEDTIME 04/21/11  Yes Bruce Romilda Garret, MD  digoxin (LANOXIN) 0.125 MG tablet Take 1 tablet (125 mcg total) by mouth daily. 08/03/11  Yes Lindley Magnus, MD  doxycycline (VIBRA-TABS) 100 MG tablet Take 100 mg by mouth 2 (two) times daily.   Yes Historical Provider, MD  ferrous sulfate 325 (65 FE) MG tablet Take 1 tablet (325 mg total) by mouth 2 (two) times daily. 08/03/11 08/02/12 Yes Bruce Romilda Garret, MD  furosemide (LASIX) 20 MG tablet Take 1 tablet (20 mg total) by mouth daily. 08/03/11 08/02/12 Yes Bruce Rexene Edison Swords, MD  glipiZIDE (GLUCOTROL) 10 MG tablet Take 1 tablet (10 mg total) by mouth daily. 08/03/11  Yes Lindley Magnus, MD  HYDROcodone-acetaminophen (NORCO) 10-325 MG per tablet Take 1 tablet by mouth every 8 (eight) hours as needed for pain. 12/06/11  Yes Baker Pierini, FNP  lisinopril (PRINIVIL,ZESTRIL) 10 MG tablet Take 10 mg by mouth daily.   Yes Historical Provider, MD  Multiple Vitamin (MULTIVITAMIN WITH MINERALS) TABS Take 1 tablet by mouth daily.   Yes Historical Provider, MD  ranitidine (ZANTAC) 150  MG tablet TAKE ONE TABLET BY MOUTH EVERY DAY 04/20/11  Yes Lindley Magnus, MD  ranolazine (RANEXA) 500 MG 12 hr tablet Take 1 tablet (500 mg total) by mouth 2 (two) times daily. 08/03/11 08/02/12 Yes Bruce Romilda Garret, MD  terazosin (HYTRIN) 10 MG capsule Take 1 capsule (10 mg total) by mouth at bedtime. 08/03/11  Yes Lindley Magnus, MD  vitamin E 400 UNIT capsule Take 400 Units by mouth daily.     Yes Historical Provider, MD  zinc sulfate (ZINCATE) 220 MG capsule Take 1 capsule (220 mg total) by mouth daily. 10/04/11 10/03/12 Yes Lindley Magnus, MD   Physical Exam: Filed  Vitals:   12/15/11 1619 12/15/11 1828 12/15/11 2000  BP: 103/38 112/46 114/49  Pulse: 65  60  Temp: 98.2 F (36.8 C) 98.2 F (36.8 C)   TempSrc: Oral Oral   Resp: 18 16 21   SpO2: 98% 100% 100%     General:  NAD, resting comfortably in hospital bed  Eyes: PEERLA EOMI  ENT: temporal muscle wasting noted  Neck: supple w/o JVD  Cardiovascular: RRR w/o MRG  Respiratory: CTA B  Abdomen: soft, nt, nd, bs+  Skin: erythema surrounding a 2-3 cm ulcer on his foot, the wound is grossly infected, looks like he may have charcot foot, and I can directly see what appears to be the patients calcaneous bone in the wound  Musculoskeletal: MAE  Psychiatric: normal affect, normal mood  Neurologic: AAOx3, grossly non-focal  Labs on Admission:  Basic Metabolic Panel:  Lab 12/15/11 1610  NA 139  K 5.2*  CL 105  CO2 24  GLUCOSE 182*  BUN 27*  CREATININE 1.27  CALCIUM 9.6  MG --  PHOS --   Liver Function Tests:  Lab 12/15/11 1728  AST 25  ALT 16  ALKPHOS 172*  BILITOT 0.6  PROT 6.4  ALBUMIN 2.0*   No results found for this basename: LIPASE:5,AMYLASE:5 in the last 168 hours No results found for this basename: AMMONIA:5 in the last 168 hours CBC:  Lab 12/15/11 1728  WBC 10.8*  NEUTROABS 9.3*  HGB 6.8*  HCT 21.7*  MCV 81.3  PLT 221   Cardiac Enzymes: No results found for this basename: CKTOTAL:5,CKMB:5,CKMBINDEX:5,TROPONINI:5 in the last 168 hours  BNP (last 3 results) No results found for this basename: PROBNP:3 in the last 8760 hours CBG:  Lab 12/13/11 1037  GLUCAP 173*    Radiological Exams on Admission: Dg Chest 2 View  12/15/2011  *RADIOLOGY REPORT*  Clinical Data: 78 year old male with cough.  Preoperative respiratory examination for right foot surgery.  CHEST - 2 VIEW  Comparison: None  Findings: Cardiomegaly and evidence of cardiac valve replacement noted. Mild interstitial prominence is noted. There is no evidence of focal airspace disease, pulmonary  edema, suspicious pulmonary nodule/mass, pleural effusion, or pneumothorax. No acute bony abnormalities are identified.  IMPRESSION: Interstitial prominence - probably chronic but of uncertain chronicity.  Cardiomegaly without other significant abnormalities identified.   Original Report Authenticated By: Rosendo Gros, M.D.     EKG: Ordered.  Assessment/Plan Principal Problem:  *Diabetic foot ulcer with osteomyelitis Active Problems:  DIABETES MELLITUS, TYPE II  HYPERLIPIDEMIA  HYPERTENSION  Anemia   1. Diabetic foot ulcer which clinically appears to have osteomyelitis (I can directly visualize what appears to be his calcaneous in the wound, this exam finding alone strongly suggests its presence) - Dr. Victorino Dike apparently confirmed this with X Ray in office today, agree that this would be very  unlikely to do well with medical treatment alone.  Will admit patient, put on cefepime and vancomycin empirically to cover the 4 organisms isolated from his wound culture as well as possible pseudomonas given that this is a diabetic foot ulcer.  This is not intended to be curative for his ulcer but rather prevent systemic spread of infection until the already scheduled amputation occurs.  Likely can DC these after amputation unless blood cultures come back positive. 2. DM2 - hold glipizide and use SSI low dose, as patient will be NPO for surgery tomorrow. 3. H/o hyperlipidemia, HTN - continue home meds for these 4. Anemia - will transfuse 2 units, recheck CBC in AM.   Code Status: Full Code Family Communication: Spoke with daughter who is in room at bedside Disposition Plan: Admit to inpatient, likely will be here at least several days  Time spent: 70 min  GARDNER, JARED M. [facility] Pager [phone]  If 7PM-7AM, please contact night-coverage [email or web address] Password [email or web address] 12/15/2011, 9:12 PM

## 2011-12-15 NOTE — ED Provider Notes (Signed)
History     CSN: [id number]  Arrival date & time 12/15/11  1545   None     Chief Complaint  Patient presents with  . Dr. Victorino Dike Possible amputation to RLE tomorrow     (Consider location/radiation/quality/duration/timing/severity/associated sxs/prior treatment) HPI Comments: Pt with chronic diabetic foot ulcer on heel of right foot now with concern for infection.  Patient is a 78 y.o. male presenting with general illness. The history is provided by the patient and a relative.  Illness  The current episode started more than 2 weeks ago. The problem occurs continuously. The problem has been unchanged. The problem is severe. Nothing (on doxy right now as antibiotic) relieves the symptoms. Nothing aggravates the symptoms. Associated symptoms include a fever (intermittent, has had UTI and pneumonia in last several months.). Pertinent negatives include no abdominal pain, no diarrhea, no nausea, no vomiting, no headaches, no cough and no URI.    Past Medical History  Diagnosis Date  . Valvular heart disease   . Diabetes mellitus   . GERD (gastroesophageal reflux disease)   . Hypertension   . Hyperlipidemia     Past Surgical History  Procedure Date  . Aortic valve replacement     porcine  . Trauma with skull fracture   . Knee surgery     Family History  Problem Relation Age of Onset  . Diabetes Sister   . Cancer Sister   . Cancer Brother     History  Substance Use Topics  . Smoking status: Former Smoker    Quit date: 05/31/1985  . Smokeless tobacco: Current User    Types: Chew  . Alcohol Use: No      Review of Systems  Constitutional: Positive for fever (intermittent, has had UTI and pneumonia in last several months.).  Respiratory: Negative for cough and shortness of breath.   Cardiovascular: Negative for chest pain.  Gastrointestinal: Negative for nausea, vomiting, abdominal pain and diarrhea.  Neurological: Negative for headaches.  All other systems reviewed  and are negative.    Allergies  Review of patient's allergies indicates no known allergies.  Home Medications   Current Outpatient Rx  Name Route Sig Dispense Refill  . VITAMIN C 500 MG PO TABS Oral Take 500 mg by mouth daily.      . ASPIRIN 81 MG PO CHEW Oral Chew 81 mg by mouth daily.    . ATORVASTATIN CALCIUM 10 MG PO TABS Oral Take 1 tablet (10 mg total) by mouth daily. 30 tablet 5  . BACLOFEN 10 MG PO TABS  TAKE ONE TABLET BY MOUTH AT BEDTIME 45 each 2  . DIGOXIN 0.125 MG PO TABS Oral Take 1 tablet (125 mcg total) by mouth daily. 30 tablet 5  . DOXYCYCLINE HYCLATE 100 MG PO TABS Oral Take 100 mg by mouth 2 (two) times daily.    Marland Kitchen FERROUS SULFATE 325 (65 FE) MG PO TABS Oral Take 1 tablet (325 mg total) by mouth 2 (two) times daily. 60 tablet 5  . FUROSEMIDE 20 MG PO TABS Oral Take 1 tablet (20 mg total) by mouth daily. 30 tablet 5  . GLIPIZIDE 10 MG PO TABS Oral Take 1 tablet (10 mg total) by mouth daily. 30 tablet 5  . HYDROCODONE-ACETAMINOPHEN 10-325 MG PO TABS Oral Take 1 tablet by mouth every 8 (eight) hours as needed for pain. 30 tablet 0  . LISINOPRIL 10 MG PO TABS Oral Take 10 mg by mouth daily.    . ADULT MULTIVITAMIN W/MINERALS  CH Oral Take 1 tablet by mouth daily.    Marland Kitchen RANITIDINE HCL 150 MG PO TABS  TAKE ONE TABLET BY MOUTH EVERY DAY 30 tablet 5  . RANOLAZINE ER 500 MG PO TB12 Oral Take 1 tablet (500 mg total) by mouth 2 (two) times daily. 60 tablet 5  . TERAZOSIN HCL 10 MG PO CAPS Oral Take 1 capsule (10 mg total) by mouth at bedtime. 30 capsule 5  . VITAMIN E 400 UNITS PO CAPS Oral Take 400 Units by mouth daily.      Marland Kitchen ZINC SULFATE 220 MG PO CAPS Oral Take 1 capsule (220 mg total) by mouth daily. 30 capsule 5    BP 103/38  Pulse 65  Temp 98.2 F (36.8 C) (Oral)  Resp 18  SpO2 98%  Physical Exam  Nursing note and vitals reviewed. Constitutional: He is oriented to person, place, and time. He appears well-developed and well-nourished. No distress.  HENT:  Head:  Normocephalic and atraumatic.  Eyes: Pupils are equal, round, and reactive to light.  Cardiovascular: Normal rate and normal heart sounds.   Pulmonary/Chest: Effort normal and breath sounds normal. No respiratory distress.  Abdominal: Soft. He exhibits no distension. There is no tenderness.  Musculoskeletal: Normal range of motion. He exhibits edema.       Feet:  Neurological: He is alert and oriented to person, place, and time.  Skin: Skin is warm and dry.  Psychiatric: He has a normal mood and affect.    ED Course  Procedures (including critical care time)  Labs Reviewed  CBC WITH DIFFERENTIAL - Abnormal; Notable for the following:    WBC 10.8 (*)     RBC 2.67 (*)     Hemoglobin 6.8 (*)     HCT 21.7 (*)     MCH 25.5 (*)     RDW 16.3 (*)     Neutrophils Relative 86 (*)     Neutro Abs 9.3 (*)     Lymphocytes Relative 6 (*)     Lymphs Abs 0.6 (*)     All other components within normal limits  COMPREHENSIVE METABOLIC PANEL - Abnormal; Notable for the following:    Potassium 5.2 (*)     Glucose, Bld 182 (*)     BUN 27 (*)     Albumin 2.0 (*)     Alkaline Phosphatase 172 (*)     GFR calc non Af Amer 49 (*)     GFR calc Af Amer 57 (*)     All other components within normal limits  URINALYSIS, ROUTINE W REFLEX MICROSCOPIC - Abnormal; Notable for the following:    APPearance CLOUDY (*)     All other components within normal limits  LACTIC ACID, PLASMA  PREPARE RBC (CROSSMATCH)  TYPE AND SCREEN  PREPARE RBC (CROSSMATCH)  CBC  BASIC METABOLIC PANEL  PROTIME-INR  APTT   Dg Chest 2 View  12/15/2011  *RADIOLOGY REPORT*  Clinical Data: 78 year old male with cough.  Preoperative respiratory examination for right foot surgery.  CHEST - 2 VIEW  Comparison: None  Findings: Cardiomegaly and evidence of cardiac valve replacement noted. Mild interstitial prominence is noted. There is no evidence of focal airspace disease, pulmonary edema, suspicious pulmonary nodule/mass, pleural  effusion, or pneumothorax. No acute bony abnormalities are identified.  IMPRESSION: Interstitial prominence - probably chronic but of uncertain chronicity.  Cardiomegaly without other significant abnormalities identified.   Original Report Authenticated By: Rosendo Gros, M.D.      1. HYPERLIPIDEMIA  2. DIABETES MELLITUS, TYPE II   3. Anemia   4. Diabetic foot ulcer with osteomyelitis       MDM  5:09 PM Pt seen and examined. Pt sent from Ortho office to have right leg amputated tomorrow. Will call Dr. Victorino Dike to see if he will admit the patient or if he wants the PCP to do this. Will order routine pre-op labs, EKG, and CXR.   6:30 PM I spoke to Dr. Laverta Baltimore partner who is covering tonight. He will attempt to determine what the plan was to be.  7:00 PM Orthopedics feels patient would be best served on medicine service. Awaiting lab results.  Pt found to be anemic. As he will likely be having surgery tomorrow, will transfuse one unit now and hold several in reserve.   9:30 PM Pt will be admitted to medicine.   Daleen Bo, MD 12/16/11 [phone]

## 2011-12-15 NOTE — ED Notes (Signed)
Pt was told to come to the ER for admission for amputation of right leg below knee tomorrow by dr. Victorino Dike.  Pt has cast on and states diabetic ulcer to heel with significant swelling

## 2011-12-15 NOTE — ED Notes (Signed)
Patient transported to X-ray 

## 2011-12-15 NOTE — ED Notes (Signed)
Spoke with OR, pt is scheduled for a BKA of R leg tomorrow morning at 10am.

## 2011-12-15 NOTE — Progress Notes (Signed)
ANTIBIOTIC CONSULT NOTE - INITIAL  Pharmacy Consult for vancomycin Indication: R/o osteomyelitis  No Known Allergies  Patient Measurements: Height: 5' 6.14" (168 cm) Weight: 170 lb (77.111 kg) (Per 12/06/11 documentation) IBW/kg (Calculated) : 64.13   Vital Signs: Temp: 98.2 F (36.8 C) (10/02 1828) Temp src: Oral (10/02 1828) BP: 105/46 mmHg (10/02 2130) Pulse Rate: 63  (10/02 2130) Intake/Output from previous day:   Intake/Output from this shift:    Labs:  Basename 12/15/11 1728  WBC 10.8*  HGB 6.8*  PLT 221  LABCREA --  CREATININE 1.27   Estimated Creatinine Clearance: 40.2 ml/min (by C-G formula based on Cr of 1.27). No results found for this basename: VANCOTROUGH:2,VANCOPEAK:2,VANCORANDOM:2,GENTTROUGH:2,GENTPEAK:2,GENTRANDOM:2,TOBRATROUGH:2,TOBRAPEAK:2,TOBRARND:2,AMIKACINPEAK:2,AMIKACINTROU:2,AMIKACIN:2, in the last 72 hours   Microbiology: No results found for this or any previous visit (from the past 720 hour(s)).  Medical History: Past Medical History  Diagnosis Date  . Valvular heart disease   . Diabetes mellitus   . GERD (gastroesophageal reflux disease)   . Hypertension   . Hyperlipidemia     Medications:  Scheduled:    . aspirin  81 mg Oral Daily  . atorvastatin  10 mg Oral Daily  . ceFEPime (MAXIPIME) IV  1 g Intravenous Q12H  . chlorhexidine  60 mL Topical Once  . digoxin  125 mcg Oral Daily  . ferrous sulfate  325 mg Oral BID  . furosemide  20 mg Oral Daily  . glipiZIDE  10 mg Oral Daily  . insulin aspart  0-9 Units Subcutaneous Q4H  . lisinopril  10 mg Oral Daily  . multivitamin with minerals  1 tablet Oral Daily  . ranolazine  500 mg Oral BID  . sodium chloride  3 mL Intravenous Q12H  . terazosin  10 mg Oral QHS  . vitamin C  500 mg Oral Daily  . vitamin E  400 Units Oral Daily  . zinc sulfate  220 mg Oral Daily   Assessment: 78 yo male admitted with diabetic foot ulcer with r/o osteomyelitis. Patient with wound culture from 9/23  that showed E. cloacae, K. pneumoniae, S. aureus (MRSA), and E. faecalis. Pharmacy consulted to manage vancomycin. Patient is also to receive cefepime. From 9/23 wound culture, E. cloacae and K . pnuemoniae were sensitive to cefepime and MRSA and E. faecalis were sensitive to vancomycin. Plan for BK amputation 10/3.   Goal of Therapy:  Vancomycin trough level 15-20 mcg/ml  Plan:  1. Vancomycin 1gm now, then 750mg  IV Q12H.   Thad Ranger, Mellody Drown 12/15/2011,10:45 PM

## 2011-12-15 NOTE — ED Notes (Signed)
Unable to obtain an IV x2. IV team called

## 2011-12-15 NOTE — ED Notes (Signed)
Spoke with lab, they're running the type and screen and said it may be positive for antibodies.  They said because of this it will take more time.

## 2011-12-15 NOTE — ED Notes (Signed)
Dr. Victorino Dike wants EDP to evaluate and start admission process

## 2011-12-15 NOTE — ED Notes (Signed)
Attempted to call report to 6N, nurse unavailable.  Was told they'll call back.

## 2011-12-16 ENCOUNTER — Encounter (HOSPITAL_COMMUNITY): Payer: Self-pay | Admitting: Anesthesiology

## 2011-12-16 ENCOUNTER — Inpatient Hospital Stay (HOSPITAL_COMMUNITY): Payer: Medicare Other

## 2011-12-16 ENCOUNTER — Inpatient Hospital Stay (HOSPITAL_COMMUNITY): Payer: Medicare Other | Admitting: Anesthesiology

## 2011-12-16 ENCOUNTER — Encounter (HOSPITAL_COMMUNITY)
Admission: EM | Disposition: A | Discharge status: Rehabilitation Facility | Payer: Self-pay | Source: Home / Self Care | Attending: Internal Medicine

## 2011-12-16 ENCOUNTER — Inpatient Hospital Stay (HOSPITAL_COMMUNITY): Admission: RE | Admit: 2011-12-16 | Payer: Medicare Other | Source: Ambulatory Visit | Admitting: Orthopedic Surgery

## 2011-12-16 DIAGNOSIS — E119 Type 2 diabetes mellitus without complications: Secondary | ICD-10-CM

## 2011-12-16 DIAGNOSIS — M869 Osteomyelitis, unspecified: Secondary | ICD-10-CM

## 2011-12-16 DIAGNOSIS — L97509 Non-pressure chronic ulcer of other part of unspecified foot with unspecified severity: Secondary | ICD-10-CM

## 2011-12-16 DIAGNOSIS — M9080 Osteopathy in diseases classified elsewhere, unspecified site: Secondary | ICD-10-CM

## 2011-12-16 DIAGNOSIS — E785 Hyperlipidemia, unspecified: Secondary | ICD-10-CM

## 2011-12-16 DIAGNOSIS — D649 Anemia, unspecified: Secondary | ICD-10-CM

## 2011-12-16 DIAGNOSIS — E1169 Type 2 diabetes mellitus with other specified complication: Principal | ICD-10-CM

## 2011-12-16 HISTORY — PX: AMPUTATION: SHX166

## 2011-12-16 LAB — BASIC METABOLIC PANEL
BUN: 24 mg/dL — ABNORMAL HIGH (ref 6–23)
CO2: 26 mEq/L (ref 19–32)
Chloride: 106 mEq/L (ref 96–112)
Creatinine, Ser: 1.19 mg/dL (ref 0.50–1.35)
GFR calc Af Amer: 61 mL/min — ABNORMAL LOW (ref >90)
Glucose, Bld: 95 mg/dL (ref 70–99)
Potassium: 4.4 mEq/L (ref 3.5–5.1)

## 2011-12-16 LAB — PROTIME-INR: INR: 1.56 — ABNORMAL HIGH (ref 0.00–1.49)

## 2011-12-16 LAB — CBC
HCT: 20.5 % — ABNORMAL LOW (ref 39.0–52.0)
Hemoglobin: 6.8 g/dL — CL (ref 13.0–17.0)
MCV: 80.1 fL (ref 78.0–100.0)
RDW: 15.7 % — ABNORMAL HIGH (ref 11.5–15.5)
WBC: 6.3 10*3/uL (ref 4.0–10.5)

## 2011-12-16 LAB — FERRITIN: Ferritin: 264 ng/mL (ref 22–322)

## 2011-12-16 LAB — GLUCOSE, CAPILLARY
Glucose-Capillary: 130 mg/dL — ABNORMAL HIGH (ref 70–99)
Glucose-Capillary: 81 mg/dL (ref 70–99)
Glucose-Capillary: 153 mg/dL — ABNORMAL HIGH (ref 70–99)

## 2011-12-16 LAB — VITAMIN B12: Vitamin B-12: 950 pg/mL — ABNORMAL HIGH (ref 211–911)

## 2011-12-16 LAB — RETICULOCYTES
RBC.: 3.65 MIL/uL — ABNORMAL LOW (ref 4.22–5.81)
Retic Ct Pct: 1.7 % (ref 0.4–3.1)

## 2011-12-16 LAB — IRON AND TIBC

## 2011-12-16 LAB — HEMOGLOBIN AND HEMATOCRIT, BLOOD: HCT: 30.1 % — ABNORMAL LOW (ref 39.0–52.0)

## 2011-12-16 LAB — APTT: aPTT: 40 seconds — ABNORMAL HIGH (ref 24–37)

## 2011-12-16 LAB — LACTATE DEHYDROGENASE: LDH: 168 U/L (ref 94–250)

## 2011-12-16 LAB — SURGICAL PCR SCREEN: MRSA, PCR: POSITIVE — AB

## 2011-12-16 SURGERY — AMPUTATION BELOW KNEE
Anesthesia: General | Site: Leg Lower | Laterality: Right | Wound class: Dirty or Infected

## 2011-12-16 MED ORDER — FUROSEMIDE 10 MG/ML IJ SOLN
20.0000 mg | Freq: Once | INTRAMUSCULAR | Status: AC
  Administered 2011-12-16: 20 mg via INTRAVENOUS
  Filled 2011-12-16 (×2): qty 2

## 2011-12-16 MED ORDER — MUPIROCIN 2 % EX OINT
1.0000 "application " | TOPICAL_OINTMENT | Freq: Two times a day (BID) | CUTANEOUS | Status: DC
  Administered 2011-12-16 – 2011-12-20 (×8): 1 via NASAL
  Filled 2011-12-16 (×2): qty 22

## 2011-12-16 MED ORDER — LACTATED RINGERS IV SOLN
INTRAVENOUS | Status: DC | PRN
  Administered 2011-12-16: 11:00:00 via INTRAVENOUS

## 2011-12-16 MED ORDER — FENTANYL CITRATE 0.05 MG/ML IJ SOLN
INTRAMUSCULAR | Status: DC | PRN
  Administered 2011-12-16: 150 ug via INTRAVENOUS

## 2011-12-16 MED ORDER — DEXTROSE-NACL 5-0.45 % IV SOLN
INTRAVENOUS | Status: AC
  Administered 2011-12-16: 14:00:00 via INTRAVENOUS

## 2011-12-16 MED ORDER — ONDANSETRON HCL 4 MG/2ML IJ SOLN: 4.0000 mg | Freq: Four times a day (QID) | INTRAMUSCULAR | Status: DC | PRN

## 2011-12-16 MED ORDER — METOCLOPRAMIDE HCL 5 MG PO TABS
5.0000 mg | ORAL_TABLET | Freq: Three times a day (TID) | ORAL | Status: DC | PRN
  Filled 2011-12-16: qty 2

## 2011-12-16 MED ORDER — ONDANSETRON HCL 4 MG/2ML IJ SOLN
INTRAMUSCULAR | Status: DC | PRN
  Administered 2011-12-16: 4 mg via INTRAVENOUS

## 2011-12-16 MED ORDER — HYDROCODONE-ACETAMINOPHEN 5-325 MG PO TABS
1.0000 | ORAL_TABLET | ORAL | Status: DC | PRN
  Administered 2011-12-16 – 2011-12-17 (×4): 1 via ORAL
  Filled 2011-12-16 (×4): qty 1

## 2011-12-16 MED ORDER — PROMETHAZINE HCL 25 MG/ML IJ SOLN: 6.2500 mg | INTRAMUSCULAR | Status: DC | PRN

## 2011-12-16 MED ORDER — MEPERIDINE HCL 25 MG/ML IJ SOLN: 6.2500 mg | INTRAMUSCULAR | Status: DC | PRN

## 2011-12-16 MED ORDER — FUROSEMIDE 20 MG PO TABS
20.0000 mg | ORAL_TABLET | Freq: Every day | ORAL | Status: DC
  Administered 2011-12-17 – 2011-12-20 (×4): 20 mg via ORAL
  Filled 2011-12-16 (×4): qty 1

## 2011-12-16 MED ORDER — INSULIN ASPART 100 UNIT/ML ~~LOC~~ SOLN
0.0000 [IU] | Freq: Three times a day (TID) | SUBCUTANEOUS | Status: DC
  Administered 2011-12-16: 1 [IU] via SUBCUTANEOUS
  Administered 2011-12-17 (×3): 2 [IU] via SUBCUTANEOUS
  Administered 2011-12-18: 3 [IU] via SUBCUTANEOUS

## 2011-12-16 MED ORDER — HEPARIN SODIUM (PORCINE) 5000 UNIT/ML IJ SOLN
5000.0000 [IU] | Freq: Three times a day (TID) | INTRAMUSCULAR | Status: DC
  Administered 2011-12-17 – 2011-12-20 (×10): 5000 [IU] via SUBCUTANEOUS
  Filled 2011-12-16 (×13): qty 1

## 2011-12-16 MED ORDER — SODIUM CHLORIDE 0.9 % IV SOLN
INTRAVENOUS | Status: DC | PRN
  Administered 2011-12-16: 11:00:00 via INTRAVENOUS

## 2011-12-16 MED ORDER — 0.9 % SODIUM CHLORIDE (POUR BTL) OPTIME
TOPICAL | Status: DC | PRN
  Administered 2011-12-16: 1000 mL

## 2011-12-16 MED ORDER — CHLORHEXIDINE GLUCONATE CLOTH 2 % EX PADS
6.0000 | MEDICATED_PAD | Freq: Every day | CUTANEOUS | Status: DC
  Administered 2011-12-16 – 2011-12-20 (×4): 6 via TOPICAL

## 2011-12-16 MED ORDER — HYDROMORPHONE HCL PF 1 MG/ML IJ SOLN: 0.2500 mg | INTRAMUSCULAR | Status: DC | PRN

## 2011-12-16 MED ORDER — SODIUM CHLORIDE 0.9 % IV SOLN: INTRAVENOUS | Status: DC

## 2011-12-16 MED ORDER — BACITRACIN ZINC 500 UNIT/GM EX OINT
TOPICAL_OINTMENT | CUTANEOUS | Status: DC | PRN
  Administered 2011-12-16: 1 via TOPICAL

## 2011-12-16 MED ORDER — EPHEDRINE SULFATE 50 MG/ML IJ SOLN
INTRAMUSCULAR | Status: DC | PRN
  Administered 2011-12-16 (×3): 10 mg via INTRAVENOUS

## 2011-12-16 MED ORDER — MIDAZOLAM HCL 2 MG/2ML IJ SOLN: 0.5000 mg | Freq: Once | INTRAMUSCULAR | Status: DC | PRN

## 2011-12-16 MED ORDER — SUCCINYLCHOLINE CHLORIDE 20 MG/ML IJ SOLN
INTRAMUSCULAR | Status: DC | PRN
  Administered 2011-12-16: 100 mg via INTRAVENOUS

## 2011-12-16 MED ORDER — PANTOPRAZOLE SODIUM 40 MG IV SOLR
40.0000 mg | Freq: Two times a day (BID) | INTRAVENOUS | Status: DC
  Administered 2011-12-16 – 2011-12-17 (×4): 40 mg via INTRAVENOUS
  Filled 2011-12-16 (×7): qty 40

## 2011-12-16 MED ORDER — METOCLOPRAMIDE HCL 5 MG/ML IJ SOLN: 5.0000 mg | Freq: Three times a day (TID) | INTRAMUSCULAR | Status: DC | PRN

## 2011-12-16 MED ORDER — HYDROMORPHONE HCL PF 1 MG/ML IJ SOLN
0.5000 mg | INTRAMUSCULAR | Status: DC | PRN
  Administered 2011-12-16: 0.5 mg via INTRAVENOUS
  Filled 2011-12-16: qty 1

## 2011-12-16 MED ORDER — LIDOCAINE HCL (CARDIAC) 20 MG/ML IV SOLN
INTRAVENOUS | Status: DC | PRN
  Administered 2011-12-16: 30 mg via INTRAVENOUS

## 2011-12-16 MED ORDER — PROPOFOL 10 MG/ML IV BOLUS
INTRAVENOUS | Status: DC | PRN
  Administered 2011-12-16: 120 mg via INTRAVENOUS

## 2011-12-16 MED ORDER — ONDANSETRON HCL 4 MG PO TABS: 4.0000 mg | ORAL_TABLET | Freq: Four times a day (QID) | ORAL | Status: DC | PRN

## 2011-12-16 SURGICAL SUPPLY — 40 items
BANDAGE ELASTIC 6 VELCRO ST LF (GAUZE/BANDAGES/DRESSINGS) ×2 IMPLANT
BANDAGE ESMARK 6X9 LF (GAUZE/BANDAGES/DRESSINGS) ×1 IMPLANT
BLADE SAW RECIP 87.9 MT (BLADE) ×2 IMPLANT
BNDG ESMARK 6X9 LF (GAUZE/BANDAGES/DRESSINGS) ×2
CHLORAPREP W/TINT 26ML (MISCELLANEOUS) ×2 IMPLANT
CLOTH BEACON ORANGE TIMEOUT ST (SAFETY) ×2 IMPLANT
COVER SURGICAL LIGHT HANDLE (MISCELLANEOUS) ×2 IMPLANT
CUFF TOURNIQUET SINGLE 34IN LL (TOURNIQUET CUFF) ×2 IMPLANT
DRAPE EXTREMITY T 121X128X90 (DRAPE) ×2 IMPLANT
DRAPE INCISE IOBAN 66X45 STRL (DRAPES) ×2 IMPLANT
DRAPE PROXIMA HALF (DRAPES) ×4 IMPLANT
DRAPE U-SHAPE 47X51 STRL (DRAPES) ×2 IMPLANT
DRSG ADAPTIC 3X8 NADH LF (GAUZE/BANDAGES/DRESSINGS) ×2 IMPLANT
DRSG PAD ABDOMINAL 8X10 ST (GAUZE/BANDAGES/DRESSINGS) ×2 IMPLANT
ELECT REM PT RETURN 9FT ADLT (ELECTROSURGICAL) ×2
ELECTRODE REM PT RTRN 9FT ADLT (ELECTROSURGICAL) ×1 IMPLANT
GLOVE BIO SURGEON STRL SZ8 (GLOVE) ×2 IMPLANT
GLOVE BIOGEL PI IND STRL 8 (GLOVE) ×1 IMPLANT
GLOVE BIOGEL PI INDICATOR 8 (GLOVE) ×1
GOWN PREVENTION PLUS XLARGE (GOWN DISPOSABLE) ×2 IMPLANT
GOWN STRL NON-REIN LRG LVL3 (GOWN DISPOSABLE) ×2 IMPLANT
IMMOBILIZER KNEE 22 UNIV (SOFTGOODS) ×2 IMPLANT
KIT BASIN OR (CUSTOM PROCEDURE TRAY) ×2 IMPLANT
KIT ROOM TURNOVER OR (KITS) ×2 IMPLANT
NS IRRIG 1000ML POUR BTL (IV SOLUTION) ×2 IMPLANT
PACK GENERAL/GYN (CUSTOM PROCEDURE TRAY) ×2 IMPLANT
PAD ARMBOARD 7.5X6 YLW CONV (MISCELLANEOUS) ×2 IMPLANT
PAD CAST 4YDX4 CTTN HI CHSV (CAST SUPPLIES) ×1 IMPLANT
PADDING CAST COTTON 4X4 STRL (CAST SUPPLIES) ×1
SPONGE GAUZE 4X4 12PLY (GAUZE/BANDAGES/DRESSINGS) ×2 IMPLANT
SPONGE LAP 18X18 X RAY DECT (DISPOSABLE) IMPLANT
SUT MNCRL AB 3-0 PS2 18 (SUTURE) ×4 IMPLANT
SUT PDS 2 0 (SUTURE) ×4 IMPLANT
SUT PDS AB 0 CT 36 (SUTURE) ×2 IMPLANT
SUT PROLENE 3 0 PS 2 (SUTURE) ×2 IMPLANT
SUT SILK 2 0 (SUTURE) ×1
SUT SILK 2-0 18XBRD TIE 12 (SUTURE) ×1 IMPLANT
TOWEL OR 17X24 6PK STRL BLUE (TOWEL DISPOSABLE) ×2 IMPLANT
TOWEL OR 17X26 10 PK STRL BLUE (TOWEL DISPOSABLE) ×2 IMPLANT
WATER STERILE IRR 1000ML POUR (IV SOLUTION) ×2 IMPLANT

## 2011-12-16 NOTE — Progress Notes (Signed)
MD made aware  Of the chest X ray results.

## 2011-12-16 NOTE — Progress Notes (Signed)
INITIAL ADULT NUTRITION ASSESSMENT Date: 12/16/2011   Time: 1:10 PM Reason for Assessment: MST  INTERVENTION: 1.  Modify diet; per MD discretion to [facility] Mod High.    DOCUMENTATION CODES Per approved criteria  -Not Applicable    ASSESSMENT: Male 78 y.o.  Dx: Diabetic foot ulcer with osteomyelitis  Hx:  Past Medical History  Diagnosis Date  . Valvular heart disease   . GERD (gastroesophageal reflux disease)   . Hypertension   . Hyperlipidemia   . Complication of anesthesia     "had problem extubating him after heart surgery; don't recall why" (12/15/2011)  . Peripheral vascular disease   . Anginal pain     "used to" (12/15/2011)  . Pneumonia 11/2011    "2nd bout w/it" (12/15/2011)  . Type II diabetes mellitus   . Iron (Fe) deficiency anemia   . History of blood transfusion   . Arthritis     "joints" (12/15/2011)  . Diabetic foot ulcer     right foot (12/15/2011)   Past Surgical History  Procedure Date  . Aortic valve replacement ?1990's    porcine  . Trauma with skull fracture ?late 1950's    "has plate in his head"  . Cardiac valve replacement   . Knee arthroscopy     "took cartilage out; both knees"  . Penectomy 2013    "it had grown over; had to reopen"    Related Meds:  Scheduled Meds:   . aspirin  81 mg Oral Daily  . atorvastatin  10 mg Oral Daily  . ceFEPime (MAXIPIME) IV  1 g Intravenous Q12H  . chlorhexidine  60 mL Topical Once  . Chlorhexidine Gluconate Cloth  6 each Topical Q0600  . digoxin  125 mcg Oral Daily  . ferrous sulfate  325 mg Oral BID  . furosemide  20 mg Intravenous Once  . furosemide  20 mg Oral Daily  . glipiZIDE  10 mg Oral Daily  . heparin subcutaneous  5,000 Units Subcutaneous Q8H  . insulin aspart  0-9 Units Subcutaneous Q4H  .  morphine injection  2 mg Intravenous Once  . multivitamin with minerals  1 tablet Oral Daily  . mupirocin ointment  1 application Nasal BID  . pantoprazole (PROTONIX) IV  40 mg Intravenous Q12H  .  ranolazine  500 mg Oral BID  . sodium chloride  3 mL Intravenous Q12H  . terazosin  10 mg Oral QHS  . vancomycin  750 mg Intravenous Q12H  . vancomycin  1,000 mg Intravenous Once  . vitamin C  500 mg Oral Daily  . vitamin E  400 Units Oral Daily  . zinc sulfate  220 mg Oral Daily  . DISCONTD: furosemide  20 mg Oral Daily  . DISCONTD: lisinopril  10 mg Oral Daily   Continuous Infusions:   . dextrose 5 % and 0.45% NaCl    . DISCONTD: sodium chloride 100 mL/hr at 12/16/11 0322  . DISCONTD: sodium chloride     PRN Meds:.sodium chloride, HYDROmorphone (DILAUDID) injection, meperidine (DEMEROL) injection, midazolam, promethazine, sodium chloride, DISCONTD: 0.9 % irrigation (POUR BTL), DISCONTD: bacitracin   Ht: 5\' 6"  (167.6 cm)  Wt: 153 lb 1.6 oz (69.446 kg)  Question accuracy. Pt last weighed 170 lbs on (9/23)  Ideal Wt: 64.5 kg % Ideal Wt: 107%  Usual Wt: 170 lbs % Usual Wt: 90%  Body mass index is 24.71 kg/(m^2).  Food/Nutrition Related Hx: unable to assess, pt reported progressive wt loss PTA  Labs:  CMP  Component Value Date/Time   NA 138 12/16/2011 0620   K 4.4 12/16/2011 0620   CL 106 12/16/2011 0620   CO2 26 12/16/2011 0620   GLUCOSE 95 12/16/2011 0620   GLUCOSE 115* 03/04/2006 1152   BUN 24* 12/16/2011 0620   CREATININE 1.19 12/16/2011 0620   CALCIUM 8.8 12/16/2011 0620   PROT 6.4 12/15/2011 1728   ALBUMIN 2.0* 12/15/2011 1728   AST 25 12/15/2011 1728   ALT 16 12/15/2011 1728   ALKPHOS 172* 12/15/2011 1728   BILITOT 0.6 12/15/2011 1728   GFRNONAA 53* 12/16/2011 0620   GFRAA 61* 12/16/2011 0620    CBC    Component Value Date/Time   WBC 6.3 12/16/2011 0505   RBC 2.56* 12/16/2011 0505   HGB 6.8* 12/16/2011 0505   HCT 20.5* 12/16/2011 0505   PLT 154 12/16/2011 0505   MCV 80.1 12/16/2011 0505   MCH 26.6 12/16/2011 0505   MCHC 33.2 12/16/2011 0505   RDW 15.7* 12/16/2011 0505   LYMPHSABS 0.6* 12/15/2011 1728   MONOABS 0.8 12/15/2011 1728   EOSABS 0.0 12/15/2011 1728    BASOSABS 0.0 12/15/2011 1728   Intake: NPO Output:   Intake/Output Summary (Last 24 hours) at 12/16/11 1314 Last data filed at 12/16/11 1216  Gross per 24 hour  Intake 3079.3 ml  Output     25 ml  Net 3054.3 ml   Last BM (10/2)  Diet Order: NPO  Supplements/Tube Feeding: none at this time  IVF:    dextrose 5 % and 0.45% NaCl   DISCONTD: sodium chloride Last Rate: 100 mL/hr at 12/16/11 1610  DISCONTD: sodium chloride     Estimated Nutritional Needs:   Kcal: 1900-2080 Protein: 82-96g Fluid: ~2.0 L/day  Pt unavailable at time of visit- in OR for BKA.  Pt reported wt loss on admission.  Per chart review, pt wt had been stable at 170 lbs until the day of admission when a pre-op wt of 153 lbs was obtained.  RD unable to get nutrition-related hx at this time, however will continue to follow and assess for adequate nutrition status to promote healing.  NUTRITION DIAGNOSIS: -Increased nutrient needs (NI-5.1).  Status: Ongoing  RELATED TO: healing  AS EVIDENCE BY: pt with non-healing ulcer, BKA today  MONITORING/EVALUATION(Goals): 1.  Food/Beverage; resume of PO diet with tolerance.  Pt meeting >/=90% estimated needs.  EDUCATION NEEDS: -Education not appropriate at this time   Loyce Dys, MS RD LDN Clinical Inpatient Dietitian Pager: [phone] Weekend/After hours pager: [phone]

## 2011-12-16 NOTE — Preoperative (Addendum)
Beta Blockers   Reason not to administer Beta Blockers:Not Applicable 

## 2011-12-16 NOTE — Brief Op Note (Signed)
12/15/2011 - 12/16/2011  11:54 AM  PATIENT:  Jamie Romero  78 y.o. male  PRE-OPERATIVE DIAGNOSIS:  1.  Right foot gangrene 2.  Right calcaneus osteomyelitis  POST-OPERATIVE DIAGNOSIS:  Same  Procedure(s): Right below knee amputation  SURGEON:  Toni Arthurs, MD  ASSISTANT: n/a  ANESTHESIA:   General  EBL:  minimal   TOURNIQUET:   Total Tourniquet Time Documented: Thigh (Right) - 36 minutes  COMPLICATIONS:  None apparent  DISPOSITION:  Extubated, awake and stable to recovery.  DICTATION ID: [id number]

## 2011-12-16 NOTE — Progress Notes (Signed)
Recieved a call from OR, patient to be picked up for surgery, informed OR staff that patient is currently receiving blood per order. Staff consulted surgeon and proceeded to pick patient for surgery.

## 2011-12-16 NOTE — Progress Notes (Signed)
Notified PA on call of patient's blood pressure being 100's/30's-40's even post transfusion of one unit of blood. Also that patient's left pupil is significantly smaller than right, neither are very reactive, pt states that he has had cataract surgery on R eye, no facial droop or left sided weakness noted, patient A&O X 4. No new orders received from PA.

## 2011-12-16 NOTE — Interval H&P Note (Signed)
History and Physical Interval Note:  12/16/2011 7:23 AM  Jamie Romero  has presented today for surgery, with the diagnosis of RIGHT HEEL OSTEOMYLITIS/GANGRENE  The various methods of treatment have been discussed with the patient and family. After consideration of risks, benefits and other options for treatment, the patient has consented to  Procedure(s) (LRB) with comments: AMPUTATION BELOW KNEE (Right) as a surgical intervention .  The patient's history has been reviewed, patient examined, no change in status, stable for surgery.  I have reviewed the patient's chart and labs.  Questions were answered to the patient's satisfaction.     Toni Arthurs

## 2011-12-16 NOTE — ED Provider Notes (Signed)
I saw and evaluated the patient, reviewed the resident's note and I agree with the findings and plan.   Jazaria Jarecki, MD 12/16/11 1531 

## 2011-12-16 NOTE — Progress Notes (Signed)
Received call from lab that pt has a positive MRSA/Staph PCR.

## 2011-12-16 NOTE — Anesthesia Procedure Notes (Signed)
Procedure Name: Intubation Date/Time: 12/16/2011 11:00 AM Performed by: Quentin Ore Pre-anesthesia Checklist: Patient identified, Emergency Drugs available, Suction available, Patient being monitored and Timeout performed Patient Re-evaluated:Patient Re-evaluated prior to inductionOxygen Delivery Method: Circle system utilized Preoxygenation: Pre-oxygenation with 100% oxygen Intubation Type: IV induction Laryngoscope Size: Mac and 3 Grade View: Grade I Tube type: Oral Tube size: 7.5 mm Number of attempts: 1 Airway Equipment and Method: Stylet Placement Confirmation: ETT inserted through vocal cords under direct vision,  positive ETCO2 and breath sounds checked- equal and bilateral Secured at: 22 cm Tube secured with: Tape Dental Injury: Teeth and Oropharynx as per pre-operative assessment

## 2011-12-16 NOTE — Progress Notes (Signed)
[facility]                                                                                Patient Demographics  Jamie Romero, is a 78 y.o. male  ZOX:[id number]  [id number]  DOB - [birth year]  Admit date - 12/15/2011  Admitting Physician Toni Arthurs, MD  Outpatient Primary MD for the patient is Judie Petit, MD  LOS - 1   Chief Complaint  Patient presents with  . Dr. Victorino Dike Possible amputation to RLE tomorrow         Assessment & Plan    1. Diabetic foot ulcer with right foot osteomyelitis - status post right BKA  on 12/16/2011. As the infected area has been removed surgically will give him 2 more days of IV antibiotics and thereafter stop. Will initiate PT and have social worker see the patient for discharge purposes.   2. DM2 - he is on glipizide we'll reduce the dose to half, no glipizide today has by mouth intake was low due to surgery and use SSI low dose, sugar was noted to be low this morning, we'll check a stat CBC right now, will place him on low-dose D5 drip with frequent CBG monitoring every 2 hours, follow hypoglycemia protocol as needed  CBG (last 3)   Basename 12/16/11 1018 12/16/11 0823 12/16/11 0346  GLUCAP 75 81 130*      3. H/o hyperlipidemia, HTN - continue home meds for these     4. Anemia - hemoglobin was noted to be 7.7 few months ago. Patient is getting 2 units transfusion today, will check anemia panel, along with LDH haptoglobin and peripheral smear, will monitor H&H, will check occult blood in stool, for now we'll place him on IV PPI. Per patient and family no history of blood in stool or black colored stools. This could be anemia of chronic disease.     Code Status: Full  Family Communication: Cast with patient and his children bedside  Disposition Plan: To be decided    Procedures right BKA done by Dr. Victorino Dike 12/16/2011   Consults  orthopedics   Time Spent in minutes   35   Antibiotics     Anti-infectives     Start     Dose/Rate Route Frequency Ordered Stop   12/16/11 1000   vancomycin (VANCOCIN) 750 mg in sodium chloride 0.9 % 150 mL IVPB        750 mg 150 mL/hr over 60 Minutes Intravenous Every 12 hours 12/15/11 2255     12/15/11 2300   vancomycin (VANCOCIN) IVPB 1000 mg/200 mL premix        1,000 mg 200 mL/hr over 60 Minutes Intravenous  Once 12/15/11 2251 12/16/11 0005   12/15/11 2245   ceFEPIme (MAXIPIME) 1 g in dextrose 5 % 50 mL IVPB        1 g 100 mL/hr over 30 Minutes Intravenous Every 12 hours 12/15/11 2232            Scheduled Meds:   . aspirin  81 mg Oral Daily  . atorvastatin  10 mg Oral Daily  . ceFEPime (MAXIPIME) IV  1 g Intravenous Q12H  .  chlorhexidine  60 mL Topical Once  . Chlorhexidine Gluconate Cloth  6 each Topical Q0600  . digoxin  125 mcg Oral Daily  . ferrous sulfate  325 mg Oral BID  . furosemide  20 mg Oral Daily  . glipiZIDE  10 mg Oral Daily  . insulin aspart  0-9 Units Subcutaneous Q4H  .  morphine injection  2 mg Intravenous Once  . multivitamin with minerals  1 tablet Oral Daily  . mupirocin ointment  1 application Nasal BID  . pantoprazole (PROTONIX) IV  40 mg Intravenous Q12H  . ranolazine  500 mg Oral BID  . sodium chloride  3 mL Intravenous Q12H  . terazosin  10 mg Oral QHS  . vancomycin  750 mg Intravenous Q12H  . vancomycin  1,000 mg Intravenous Once  . vitamin C  500 mg Oral Daily  . vitamin E  400 Units Oral Daily  . zinc sulfate  220 mg Oral Daily  . DISCONTD: lisinopril  10 mg Oral Daily   Continuous Infusions:   . sodium chloride    . DISCONTD: sodium chloride 100 mL/hr at 12/16/11 0322   PRN Meds:.sodium chloride, HYDROmorphone (DILAUDID) injection, meperidine (DEMEROL) injection, midazolam, promethazine, sodium chloride, DISCONTD: 0.9 % irrigation (POUR BTL), DISCONTD: bacitracin   DVT Prophylaxis  SCDs and heparin  Lab Results  Component Value Date   PLT 154 12/16/2011      Susa Raring K  M.D on 12/16/2011 at 12:23 PM  Between 7am to 7pm - Pager - [phone]  After 7pm go to [email or web address] - password [email or web address]  And look for the night coverage person covering for me after hours  [facility] Office  [phone]    Subjective:   Jamie Romero today has, No headache, No chest pain, No abdominal pain - No Nausea, No new weakness tingling or numbness, No Cough - SOB.   Objective:   Filed Vitals:   12/16/11 0915 12/16/11 0930 12/16/11 0951 12/16/11 1218  BP: 102/44 108/46 115/47 110/50  Pulse: 52 59 62 55  Temp: 97.6 F (36.4 C) 97.6 F (36.4 C) 97.6 F (36.4 C)   TempSrc: Oral Oral Oral   Resp: 18 18 18 18   Height:      Weight:      SpO2:   100% 94%    Wt Readings from Last 3 Encounters:  12/16/11 69.446 kg (153 lb 1.6 oz)  12/16/11 69.446 kg (153 lb 1.6 oz)  12/06/11 77.111 kg (170 lb)     Intake/Output Summary (Last 24 hours) at 12/16/11 1223 Last data filed at 12/16/11 1216  Gross per 24 hour  Intake 3079.3 ml  Output     25 ml  Net 3054.3 ml    Exam Awake Alert, Oriented X 3, No new F.N deficits, Normal affect [place].AT,PERRAL Supple Neck,No JVD, No cervical lymphadenopathy appriciated.  Symmetrical Chest wall movement, Good air movement bilaterally, CTAB RRR,No Gallops,Rubs or new Murmurs, No Parasternal Heave +ve B.Sounds, Abd Soft, Non tender, No organomegaly appriciated, No rebound - guarding or rigidity. No Cyanosis, Clubbing or edema, No new Rash or bruise, right foot in bandage   Data Review   Micro Results Recent Results (from the past 240 hour(s))  SURGICAL PCR SCREEN     Status: Abnormal   Collection Time   12/16/11  4:13 AM      Component Value Range Status Comment   MRSA, PCR POSITIVE (*) NEGATIVE Final    Staphylococcus aureus POSITIVE (*) NEGATIVE Final  Radiology Reports Dg Chest 2 View  12/15/2011  *RADIOLOGY REPORT*  Clinical Data: 78 year old male with cough.  Preoperative respiratory examination for right  foot surgery.  CHEST - 2 VIEW  Comparison: None  Findings: Cardiomegaly and evidence of cardiac valve replacement noted. Mild interstitial prominence is noted. There is no evidence of focal airspace disease, pulmonary edema, suspicious pulmonary nodule/mass, pleural effusion, or pneumothorax. No acute bony abnormalities are identified.  IMPRESSION: Interstitial prominence - probably chronic but of uncertain chronicity.  Cardiomegaly without other significant abnormalities identified.   Original Report Authenticated By: Rosendo Gros, M.D.     CBC  Lab 12/16/11 0505 12/15/11 1728  WBC 6.3 10.8*  HGB 6.8* 6.8*  HCT 20.5* 21.7*  PLT 154 221  MCV 80.1 81.3  MCH 26.6 25.5*  MCHC 33.2 31.3  RDW 15.7* 16.3*  LYMPHSABS -- 0.6*  MONOABS -- 0.8  EOSABS -- 0.0  BASOSABS -- 0.0  BANDABS -- --    Chemistries   Lab 12/16/11 0620 12/15/11 1728  NA 138 139  K 4.4 5.2*  CL 106 105  CO2 26 24  GLUCOSE 95 182*  BUN 24* 27*  CREATININE 1.19 1.27  CALCIUM 8.8 9.6  MG -- --  AST -- 25  ALT -- 16  ALKPHOS -- 172*  BILITOT -- 0.6   ------------------------------------------------------------------------------------------------------------------ estimated creatinine clearance is 39.5 ml/min (by C-G formula based on Cr of 1.19). ------------------------------------------------------------------------------------------------------------------ No results found for this basename: HGBA1C:2 in the last 72 hours ------------------------------------------------------------------------------------------------------------------ No results found for this basename: CHOL:2,HDL:2,LDLCALC:2,TRIG:2,CHOLHDL:2,LDLDIRECT:2 in the last 72 hours ------------------------------------------------------------------------------------------------------------------ No results found for this basename: TSH,T4TOTAL,FREET3,T3FREE,THYROIDAB in the last 72  hours ------------------------------------------------------------------------------------------------------------------ No results found for this basename: VITAMINB12:2,FOLATE:2,FERRITIN:2,TIBC:2,IRON:2,RETICCTPCT:2 in the last 72 hours  Coagulation profile  Lab 12/16/11 0505  INR 1.56*  PROTIME --    No results found for this basename: DDIMER:2 in the last 72 hours  Cardiac Enzymes No results found for this basename: CK:3,CKMB:3,TROPONINI:3,MYOGLOBIN:3 in the last 168 hours ------------------------------------------------------------------------------------------------------------------ No components found with this basename: POCBNP:3

## 2011-12-16 NOTE — Anesthesia Preprocedure Evaluation (Addendum)
Anesthesia Evaluation  Patient identified by MRN, date of birth, ID band Patient awake    Reviewed: Allergy & Precautions, H&P , NPO status , Patient's Chart, lab work & pertinent test results  History of Anesthesia Complications Negative for: history of anesthetic complications  Airway Mallampati: II TM Distance: >3 FB Neck ROM: Full    Dental  (+) Edentulous Upper and Edentulous Lower   Pulmonary neg pneumonia -, COPDformer smoker,  breath sounds clear to auscultation  Pulmonary exam normal       Cardiovascular hypertension, Pt. on medications - angina+ Peripheral Vascular Disease + Valvular Problems/Murmurs (s/p porcine AVR and mitral ring) AS Rhythm:Regular Rate:Normal  '07 ECHO: normal LVF, porcine AVR functioning well   Neuro/Psych    GI/Hepatic Neg liver ROS, GERD-  Medicated and Controlled,  Endo/Other  diabetes (glu 75), Well Controlled, Type 2, Oral Hypoglycemic Agents  Renal/GU negative Renal ROS     Musculoskeletal   Abdominal   Peds  Hematology  (+) Blood dyscrasia (Hb 6.6, PRBC transfusing), anemia , INR 1.56   Anesthesia Other Findings   Reproductive/Obstetrics                          Anesthesia Physical Anesthesia Plan  ASA: III  Anesthesia Plan: General   Post-op Pain Management:    Induction: Intravenous  Airway Management Planned: Oral ETT  Additional Equipment:   Intra-op Plan:   Post-operative Plan: Extubation in OR  Informed Consent: I have reviewed the patients History and Physical, chart, labs and discussed the procedure including the risks, benefits and alternatives for the proposed anesthesia with the patient or authorized representative who has indicated his/her understanding and acceptance.     Plan Discussed with: CRNA and Surgeon  Anesthesia Plan Comments: (Plan routine monitors, GETA)        Anesthesia Quick Evaluation

## 2011-12-16 NOTE — Anesthesia Postprocedure Evaluation (Signed)
  Anesthesia Post-op Note  Patient: Jamie Romero  Procedure(s) Performed: Procedure(s) (LRB) with comments: AMPUTATION BELOW KNEE (Right)  Patient Location: PACU  Anesthesia Type: General  Level of Consciousness: awake, alert , oriented and patient cooperative  Airway and Oxygen Therapy: Patient Spontanous Breathing  Post-op Pain: none  Post-op Assessment: Post-op Vital signs reviewed, Patient's Cardiovascular Status Stable, Respiratory Function Stable, Patent Airway, No signs of Nausea or vomiting and Pain level controlled  Post-op Vital Signs: Reviewed and stable  Complications: No apparent anesthesia complications

## 2011-12-16 NOTE — Transfer of Care (Signed)
Immediate Anesthesia Transfer of Care Note  Patient: Jamie Romero  Procedure(s) Performed: Procedure(s) (LRB) with comments: AMPUTATION BELOW KNEE (Right)  Patient Location: PACU  Anesthesia Type: General  Level of Consciousness: awake, alert  and oriented  Airway & Oxygen Therapy: Patient Spontanous Breathing and Patient connected to face mask  Post-op Assessment: Report given to PACU RN and Post -op Vital signs reviewed and stable  Post vital signs: Reviewed and stable  Complications: No apparent anesthesia complications

## 2011-12-16 NOTE — Progress Notes (Signed)
Received pt from the ED. Pt currently has blood running. Pt A&O, oriented to call bell.

## 2011-12-16 NOTE — Progress Notes (Signed)
Notified NP on call for hospitalists of pt Hgb 6.8 this am. New orders entered to transfuse 1 unit RBC's.

## 2011-12-16 NOTE — Progress Notes (Signed)
Called lab per MD request for BMP result. Per Sutton(lab operator) Potassium level this am is 4.4. Will notify MD.

## 2011-12-16 NOTE — Progress Notes (Signed)
Received patient from PACU post BKA, patient is alert and oriented, not in any distress, VSS, Knee immobilizer to right leg in place.

## 2011-12-17 ENCOUNTER — Encounter (HOSPITAL_COMMUNITY): Payer: Self-pay | Admitting: Orthopedic Surgery

## 2011-12-17 DIAGNOSIS — I739 Peripheral vascular disease, unspecified: Secondary | ICD-10-CM

## 2011-12-17 DIAGNOSIS — L98499 Non-pressure chronic ulcer of skin of other sites with unspecified severity: Secondary | ICD-10-CM

## 2011-12-17 DIAGNOSIS — S88119A Complete traumatic amputation at level between knee and ankle, unspecified lower leg, initial encounter: Secondary | ICD-10-CM

## 2011-12-17 LAB — CBC
HCT: 28.3 % — ABNORMAL LOW (ref 39.0–52.0)
HCT: 27.7 % — ABNORMAL LOW (ref 39.0–52.0)
Hemoglobin: 9.3 g/dL — ABNORMAL LOW (ref 13.0–17.0)
Hemoglobin: 9.2 g/dL — ABNORMAL LOW (ref 13.0–17.0)
MCH: 27 pg (ref 26.0–34.0)
MCH: 27.5 pg (ref 26.0–34.0)
MCHC: 32.9 g/dL (ref 30.0–36.0)
MCHC: 33.2 g/dL (ref 30.0–36.0)
MCV: 82 fL (ref 78.0–100.0)
Platelets: 134 K/uL — ABNORMAL LOW (ref 150–400)
RBC: 3.45 MIL/uL — ABNORMAL LOW (ref 4.22–5.81)
RBC: 3.35 MIL/uL — ABNORMAL LOW (ref 4.22–5.81)
RDW: 15.6 % — ABNORMAL HIGH (ref 11.5–15.5)
WBC: 7 K/uL (ref 4.0–10.5)

## 2011-12-17 LAB — HAPTOGLOBIN: Haptoglobin: 203 mg/dL — ABNORMAL HIGH (ref 45–215)

## 2011-12-17 LAB — GLUCOSE, CAPILLARY
Glucose-Capillary: 192 mg/dL — ABNORMAL HIGH (ref 70–99)
Glucose-Capillary: 193 mg/dL — ABNORMAL HIGH (ref 70–99)

## 2011-12-17 LAB — PATHOLOGIST SMEAR REVIEW

## 2011-12-17 LAB — POCT I-STAT 4, (NA,K, GLUC, HGB,HCT)
Glucose, Bld: 86 mg/dL (ref 70–99)
HCT: 27 % — ABNORMAL LOW (ref 39.0–52.0)
Hemoglobin: 9.2 g/dL — ABNORMAL LOW (ref 13.0–17.0)
Potassium: 4.5 meq/L (ref 3.5–5.1)
Sodium: 140 meq/L (ref 135–145)

## 2011-12-17 LAB — BASIC METABOLIC PANEL
BUN: 22 mg/dL (ref 6–23)
CO2: 26 mEq/L (ref 19–32)
Calcium: 8.4 mg/dL (ref 8.4–10.5)
Chloride: 105 mEq/L (ref 96–112)
Creatinine, Ser: 1.1 mg/dL (ref 0.50–1.35)

## 2011-12-17 MED ORDER — DIPHENHYDRAMINE HCL 50 MG/ML IJ SOLN
12.5000 mg | Freq: Once | INTRAMUSCULAR | Status: AC
  Administered 2011-12-18: 12.5 mg via INTRAVENOUS
  Filled 2011-12-17: qty 1

## 2011-12-17 NOTE — Evaluation (Signed)
Physical Therapy Evaluation Patient Details Name: Jamie Romero MRN: [id number] DOB: April 21, 1922 Today's Date: 12/17/2011 Time: 4098-1191 PT Time Calculation (min): 23 min  PT Assessment / Plan / Recommendation Clinical Impression  Pt s/p R BKA. Pt with decreased strength in LLE with increased difficulty in all functional mobility. Pt will benefit from skilled PT in the acute care setting in order to maximize functional mobility and safety prior to d/c    PT Assessment  Patient needs continued PT services    Follow Up Recommendations  Post acute inpatient rehab    Barriers to Discharge Decreased caregiver support son with amputation as well    Equipment Recommendations  Rolling walker with 5" wheels    Recommendations for Other Services Rehab consult   Frequency Min 4X/week    Precautions / Restrictions Precautions Precautions: Fall Restrictions Weight Bearing Restrictions: Yes RLE Weight Bearing: Non weight bearing   Pertinent Vitals/Pain Minimal pain complaints, 3/10 with transfer.       Mobility  Bed Mobility Bed Mobility: Supine to Sit;Sitting - Scoot to Edge of Bed Supine to Sit: 3: Mod assist;With rails;HOB elevated Sitting - Scoot to Edge of Bed: 3: Mod assist;4: Min assist Details for Bed Mobility Assistance: VC for sequencing. Assist through trunk and pelvis into sitting and for scooting. Cues for weight shifting. Transfers Transfers: Sit to Stand;Stand to Sit Sit to Stand: 1: +2 Total assist;With upper extremity assist;From bed Sit to Stand: Patient Percentage: 40% Stand to Sit: 1: +2 Total assist;With upper extremity assist;To chair/3-in-1 Stand to Sit: Patient Percentage: 40% Stand Pivot Transfers: 1: +2 Total assist Stand Pivot Transfers: Patient Percentage: 30% Details for Transfer Assistance: VC for proper hand placement and sequencing. Assist for anterior translation into standing as well as assist to control descent into chair.  Pt with difficulty  using RW for transfer from bed to chair, unable to use UEs to hop, increased assistiance to pivot LLE for ease during transfer.  Ambulation/Gait Ambulation/Gait Assistance: Not tested (comment)    Shoulder Instructions     Exercises General Exercises - Lower Extremity Heel Slides: AROM;Strengthening;Right;10 reps;Supine Straight Leg Raises: AROM;Strengthening;Right;10 reps;Supine   PT Diagnosis: Difficulty walking;Acute pain  PT Problem List: Decreased strength;Decreased activity tolerance;Decreased balance;Decreased mobility;Decreased knowledge of use of DME;Decreased safety awareness;Decreased knowledge of precautions;Pain PT Treatment Interventions: DME instruction;Gait training;Functional mobility training;Therapeutic activities;Therapeutic exercise;Balance training;Patient/family education   PT Goals Acute Rehab PT Goals PT Goal Formulation: With patient Time For Goal Achievement: 12/31/11 Potential to Achieve Goals: Good Pt will go Supine/Side to Sit: with modified independence PT Goal: Supine/Side to Sit - Progress: Goal set today Pt will go Sit to Supine/Side: with modified independence PT Goal: Sit to Supine/Side - Progress: Goal set today Pt will go Sit to Stand: with min assist PT Goal: Sit to Stand - Progress: Goal set today Pt will go Stand to Sit: with min assist PT Goal: Stand to Sit - Progress: Goal set today Pt will Transfer Bed to Chair/Chair to Bed: with mod assist PT Transfer Goal: Bed to Chair/Chair to Bed - Progress: Goal set today Pt will Perform Home Exercise Program: Independently PT Goal: Perform Home Exercise Program - Progress: Goal set today  Visit Information  Last PT Received On: 12/17/11 Assistance Needed: +2    Subjective Data      Prior Functioning  Home Living Lives With: Son Available Help at Discharge: Family;Available 24 hours/day Type of Home: House Home Access: Ramped entrance Home Layout: One level Bathroom Shower/Tub: Tub/shower  unit;Curtain  Bathroom Toilet: Pharmacist, community: Yes How Accessible: Accessible via walker Home Adaptive Equipment: Wheelchair - manual;Grab bars in shower;Shower chair with back Prior Function Level of Independence: Independent with assistive device(s) (RW or WC) Able to Take Stairs?: No Driving: Yes Vocation: Retired Comments: owns Occupational hygienist: No difficulties Dominant Hand: Right    Cognition  Overall Cognitive Status: Appears within functional limits for tasks assessed/performed Arousal/Alertness: Awake/alert Orientation Level: Appears intact for tasks assessed Behavior During Session: [facility] for tasks performed    Extremity/Trunk Assessment Right Lower Extremity Assessment RLE ROM/Strength/Tone: Unable to fully assess;Due to pain;Due to precautions;Deficits RLE ROM/Strength/Tone Deficits: unable to assess knee  MMT secondary to pain and recent incision. Hip WFL. Pt able to complete SLR independently Left Lower Extremity Assessment LLE ROM/Strength/Tone: Deficits LLE ROM/Strength/Tone Deficits: Grossly 4/5 LLE Sensation: Deficits LLE Sensation Deficits: decreased sensation   Balance Balance Balance Assessed: Yes Static Standing Balance Static Standing - Balance Support: Bilateral upper extremity supported;During functional activity Static Standing - Level of Assistance: 4: Min assist Static Standing - Comment/# of Minutes: 2. Pt standing holding onto RW while completing hygiene, min assist for steadying  End of Session PT - End of Session Equipment Utilized During Treatment: Gait belt;Right knee immobilizer Activity Tolerance: Patient limited by fatigue;Patient limited by pain Patient left: in chair;with call bell/phone within reach Nurse Communication: Mobility status    [name] 12/17/2011, 9:48 AM  12/17/2011 [name] DPT PAGER: [phone] OFFICE: [phone]

## 2011-12-17 NOTE — Evaluation (Signed)
Occupational Therapy Evaluation Patient Details Name: Jamie Romero MRN: [id number] DOB: 1922/08/24 Today's Date: 12/17/2011 Time: 4098-1191 OT Time Calculation (min): 23 min  OT Assessment / Plan / Recommendation Clinical Impression  Pt s/p R BKA thus affecting PLOF.  Will benefit from acute OT services to address below problem list. Recommend CIR to further maximize independence and safety with ADLs before eventual d/c home.    OT Assessment  Patient needs continued OT Services    Follow Up Recommendations  Inpatient Rehab    Barriers to Discharge      Equipment Recommendations  3 in 1 bedside comode    Recommendations for Other Services Rehab consult  Frequency  Min 2X/week    Precautions / Restrictions Precautions Precautions: Fall Restrictions Weight Bearing Restrictions: Yes RLE Weight Bearing: Non weight bearing   Pertinent Vitals/Pain See vitals    ADL  Lower Body Bathing: Maximal assistance;Simulated Where Assessed - Lower Body Bathing: Unsupported sitting Lower Body Dressing: Performed;Maximal assistance (left sock) Where Assessed - Lower Body Dressing: Unsupported sitting Toilet Transfer: Simulated;+2 Total assistance Toilet Transfer: Patient Percentage: 40% Toilet Transfer Method: Sit to Barista:  (recliner to stedy, stedy to bed) Equipment Used: Knee Immobilizer;Gait belt (stedy lift) Transfers/Ambulation Related to ADLs: Attempted SPT x1 but pt unable to safely pivot on LLE using RW.  Pt performed sit<>Stand x2 with +2 total pt 40%.  Used stedy to transfer from chair to bed.  Pt would benefit from sara plus lift in future when fatigued during transfer. ADL Comments: Pt fatigued from sitting up in chair for several hours.  Used stedy for transfer.     OT Diagnosis: Acute pain;Generalized weakness  OT Problem List: Decreased strength;Decreased activity tolerance;Impaired balance (sitting and/or standing);Decreased knowledge of use  of DME or AE;Pain OT Treatment Interventions: Self-care/ADL training;DME and/or AE instruction;Therapeutic activities;Patient/family education;Balance training   OT Goals Acute Rehab OT Goals OT Goal Formulation: With patient Time For Goal Achievement: 12/24/11 Potential to Achieve Goals: Good ADL Goals Pt Will Perform Grooming: Sitting at sink;Standing at sink (min guard) ADL Goal: Grooming - Progress: Goal set today Pt Will Perform Lower Body Bathing: Sit to stand from chair;Sit to stand from bed;with min assist ADL Goal: Lower Body Bathing - Progress: Goal set today Pt Will Perform Lower Body Dressing: with min assist;Sit to stand from chair;Sit to stand from bed ADL Goal: Lower Body Dressing - Progress: Goal set today Pt Will Transfer to Toilet: with mod assist;Stand pivot transfer;3-in-1;with DME ADL Goal: Toilet Transfer - Progress: Goal set today Miscellaneous OT Goals Miscellaneous OT Goal #1: Pt will perform basic sit <>stand transfer with min assist in prep for functional transfers. OT Goal: Miscellaneous Goal #1 - Progress: Goal set today  Visit Information  Last OT Received On: 12/17/11 Assistance Needed: +2 PT/OT Co-Evaluation/Treatment: Yes    Subjective Data      Prior Functioning     Home Living Lives With: Son Available Help at Discharge: Family;Available 24 hours/day Type of Home: House Home Access: Ramped entrance Home Layout: One level Bathroom Shower/Tub: Tub/shower unit;Curtain Firefighter: Standard Bathroom Accessibility: Yes How Accessible: Accessible via walker Home Adaptive Equipment: Wheelchair - manual;Grab bars in shower;Shower chair with back Prior Function Level of Independence: Needs assistance Needs Assistance: Bathing Bath: Minimal Able to Take Stairs?: No Driving: Yes Vocation: Retired Comments: Reports nurse comes in 2 x weekly to assist with showering in tub.  Pt sponges bathing for past week. Communication Communication:  No difficulties Dominant Hand:  Right         Vision/Perception     Cognition  Overall Cognitive Status: Impaired Area of Impairment: Following commands Arousal/Alertness: Awake/alert Orientation Level: Appears intact for tasks assessed Behavior During Session: [facility] for tasks performed Following Commands: Follows multi-step commands with increased time Cognition - Other Comments: Required increased processing time during questions/commands.      Extremity/Trunk Assessment Right Upper Extremity Assessment RUE ROM/Strength/Tone: WFL for tasks assessed Left Upper Extremity Assessment LUE ROM/Strength/Tone: WFL for tasks assessed     Mobility Bed Mobility Bed Mobility: Sit to Supine Sit to Supine: 4: Min assist;HOB flat Details for Bed Mobility Assistance: assist to support trunk into bed Transfers Sit to Stand: 1: +2 Total assist;From chair/3-in-1;With upper extremity assist Sit to Stand: Patient Percentage: 40% Stand to Sit: 1: +2 Total assist;To bed;To chair/3-in-1;With upper extremity assist;With armrests Stand to Sit: Patient Percentage: 40% Transfer via Lift Equipment: Stedy Details for Transfer Assistance: Pt performed sit<>stand x2 with max facilitation through bil posterior hips for full upright posture. Required hand over hand to obtain safe hand placement for sit<>stand from chair to stedy.      Shoulder Instructions     Exercise     Balance     End of Session OT - End of Session Equipment Utilized During Treatment: Gait belt Activity Tolerance: Patient limited by fatigue Patient left: in bed;with call bell/phone within reach Nurse Communication: Mobility status;Need for lift equipment  GO    12/17/2011 Cipriano Mile OTR/L Pager [phone] Office [phone]  Cipriano Mile 12/17/2011, 3:30 PM

## 2011-12-17 NOTE — Progress Notes (Signed)
Patient will benefit from an inpt rehab stay prior to d/c home. This is patient's and family's preference. I will follow up on Monday to plan admit when felt patient medically ready. Son and pt in agreement. [phone]

## 2011-12-17 NOTE — Progress Notes (Signed)
Clinical Social Work Department BRIEF PSYCHOSOCIAL ASSESSMENT 12/17/2011  Patient:  Jamie Romero, Jamie Romero     Account Number:  [id number]     Admit date:  12/15/2011  Clinical Social Worker:  Dennison Bulla  Date/Time:  12/17/2011 02:30 PM  Referred by:  Physician  Date Referred:  12/17/2011 Referred for  SNF Placement   Other Referral:   Interview type:  Patient Other interview type:    PSYCHOSOCIAL DATA Living Status:  FAMILY Admitted from facility:   Level of care:   Primary support name:  Jillyn Hidden Primary support relationship to patient:  CHILD, ADULT Degree of support available:   Strong    CURRENT CONCERNS Current Concerns  Post-Acute Placement   Other Concerns:    SOCIAL WORK ASSESSMENT / PLAN CSW received referral to assist with dc planning. CSW reviewed chart which stated PT recommending CIR. CIR consult has been placed and RN is pursing rehab admission. CSW met with patient at bedside. No visitors present.    CSW introduced myself and explained role. Patient lives with son who is at home 24 hours a day. Patient went to [facility] in [place] about 6-8 weeks ago and stayed for about 3 weeks.  CSW discussed dc plans with patient. Patient prefers CIR and if he is not eligible then he prefers to return home. CSW spoke with patient regarding SNF as alternative option. Patient reports he does not desire SNF again. CSW spoke with patient regarding other options rather than [facility] if he wished but patient refused. CSW offered to call son to explained options but patient reports he will go home with [facility] if CIR is not available. Patient is aware that if he changes his mind regarding SNF that dc cannot be delayed if SNF is only barrier.    CSW is signing off but available if needed.   Assessment/plan status:  No Further Intervention Required Other assessment/ plan:   Information/referral to community resources:   SNF information    PATIENT'S/FAMILY'S RESPONSE TO PLAN OF  CARE: Patient was alert and oriented. Patient agreeable to CIR but does not desire SNF. Patient engaged throughout assessment and asked appropriate questions.

## 2011-12-17 NOTE — Progress Notes (Signed)
Subjective: 1 Day Post-Op Procedure(s) (LRB): AMPUTATION BELOW KNEE (Right) Patient reports pain as mild.  Well controlled with oral pain meds.  No phantom pain or sensation.  Objective: Vital signs in last 24 hours: Temp:  [97.3 F (36.3 C)-98.4 F (36.9 C)] 97.6 F (36.4 C) (10/04 0616) Pulse Rate:  [47-76] 62  (10/04 0616) Resp:  [15-20] 16  (10/04 0616) BP: (102-122)/(44-58) 104/58 mmHg (10/04 0616) SpO2:  [94 %-100 %] 96 % (10/04 0616)  Intake/Output from previous day: 10/03 0701 - 10/04 0700 In: 2052.5 [P.O.:240; I.V.:750; Blood:1062.5] Out: 675 [Urine:650; Blood:25] Intake/Output this shift:     Basename 12/17/11 0301 12/16/11 2047 12/16/11 1421 12/16/11 0505 12/15/11 1728  HGB 9.3* 9.3* 10.0* 6.8* 6.8*    Basename 12/17/11 0301 12/16/11 2047 12/16/11 0505  WBC 7.0 -- 6.3  RBC 3.45* -- 2.56*  HCT 28.3* 27.7* --  PLT 134* -- 154    Basename 12/17/11 0301 12/16/11 0620  NA 136 138  K 4.5 4.4  CL 105 106  CO2 26 26  BUN 22 24*  CREATININE 1.10 1.19  GLUCOSE 143* 95  CALCIUM 8.4 8.8    Basename 12/16/11 0505  LABPT --  INR 1.56*    thin elderly male in nad.  R LE dressed and dry.  No bleeding evident.  Assessment/Plan: 1 Day Post-Op Procedure(s) (LRB): AMPUTATION BELOW KNEE (Right) Up with therapy  PT, OT starting today.  Would consider stopping abx given amputation through healthy tissue. I'll continue to follow.  SCDs and aspirin for DVT prophylaxis.    Toni Arthurs 12/17/2011, 7:44 AM

## 2011-12-17 NOTE — Consult Note (Signed)
Physical Medicine and Rehabilitation Consult Reason for Consult: R-BKA Referring Physician:  Dr. Thedore Mins    HPI: Jamie Romero is a 78 y.o. 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  He was started on IV vancomycin and underwent R-BKA on 12/16/11 by Dr. Victorino Dike. PT evaluation done today and inpatient rehab recommended for progression.   Review of Systems  Respiratory: Negative for shortness of breath and wheezing.   Cardiovascular: Negative for chest pain and palpitations.  Musculoskeletal: Positive for joint pain (R-BKA site).       "double jointed"--knee gets out of joint. Right RTC problems.   Neurological: Positive for weakness. Negative for headaches.   Past Medical History  Diagnosis Date  . Valvular heart disease   . GERD (gastroesophageal reflux disease)   . Hypertension   . Hyperlipidemia   . Complication of anesthesia     "had problem extubating him after heart surgery; don't recall why" (12/15/2011)  . Peripheral vascular disease   . Anginal pain     "used to" (12/15/2011)  . Pneumonia 11/2011    "2nd bout w/it" (12/15/2011)  . Type II diabetes mellitus   . Iron (Fe) deficiency anemia   . History of blood transfusion   . Arthritis     "joints" (12/15/2011)  . Diabetic foot ulcer     right foot (12/15/2011)   Past Surgical History  Procedure Date  . Aortic valve replacement ?1990's    porcine  . Trauma with skull fracture ?late 1950's    "has plate in his head"  . Cardiac valve replacement   . Knee arthroscopy     "took cartilage out; both knees"  . Penectomy 2013    "it had grown over; had to reopen"   Family History  Problem Relation Age of Onset  . Diabetes Sister   . Cancer Sister   . Cancer Brother    Social History:  Lives with family. Independent--was able to walk short distances with walker. Used WC in  home occasionally. Needed some assist with ADLs. He reports that he has quit smoking. His smoking use included Cigars. His smokeless tobacco use includes Chew. He reports that he does not drink alcohol or use illicit drugs.  Allergies: No Known Allergies  Medications Prior to Admission  Medication Sig Dispense Refill  . Ascorbic Acid (VITAMIN C) 500 MG tablet Take 500 mg by mouth daily.        Marland Kitchen aspirin 81 MG chewable tablet Chew 81 mg by mouth daily.      Marland Kitchen atorvastatin (LIPITOR) 10 MG tablet Take 1 tablet (10 mg total) by mouth daily.  30 tablet  5  . baclofen (LIORESAL) 10 MG tablet TAKE ONE TABLET BY MOUTH AT BEDTIME  45 each  2  . digoxin (LANOXIN) 0.125 MG tablet Take 1 tablet (125 mcg total) by mouth daily.  30 tablet  5  . doxycycline (VIBRA-TABS) 100 MG tablet Take 100 mg by mouth 2 (two) times daily.      . ferrous sulfate 325 (65 FE) MG tablet Take 1 tablet (325 mg total) by mouth 2 (two) times daily.  60 tablet  5  . furosemide (LASIX) 20 MG tablet Take 1 tablet (20 mg total) by mouth daily.  30 tablet  5  . glipiZIDE (GLUCOTROL) 10 MG tablet Take 1 tablet (10 mg total) by  mouth daily.  30 tablet  5  . HYDROcodone-acetaminophen (NORCO) 10-325 MG per tablet Take 1 tablet by mouth every 8 (eight) hours as needed for pain.  30 tablet  0  . lisinopril (PRINIVIL,ZESTRIL) 10 MG tablet Take 10 mg by mouth daily.      . Multiple Vitamin (MULTIVITAMIN WITH MINERALS) TABS Take 1 tablet by mouth daily.      . ranitidine (ZANTAC) 150 MG tablet TAKE ONE TABLET BY MOUTH EVERY DAY  30 tablet  5  . ranolazine (RANEXA) 500 MG 12 hr tablet Take 1 tablet (500 mg total) by mouth 2 (two) times daily.  60 tablet  5  . terazosin (HYTRIN) 10 MG capsule Take 1 capsule (10 mg total) by mouth at bedtime.  30 capsule  5  . vitamin E 400 UNIT capsule Take 400 Units by mouth daily.        Marland Kitchen zinc sulfate (ZINCATE) 220 MG capsule Take 1 capsule (220 mg total) by mouth daily.  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 Status:  Mobility: Bed Mobility Bed Mobility: Supine to Sit;Sitting - Scoot to Edge of Bed Supine to Sit: 3: Mod assist;With rails;HOB elevated Sitting - Scoot to Edge of Bed: 3: Mod assist;4: Min assist Transfers Transfers: Sit to Stand;Stand to Sit Sit to Stand: 1: +2 Total assist;With upper extremity assist;From bed Sit to Stand: Patient Percentage: 40% Stand to Sit: 1: +2 Total assist;With upper extremity assist;To chair/3-in-1 Stand to Sit: Patient Percentage: 40% Stand Pivot Transfers: 1: +2 Total assist Stand Pivot Transfers: Patient Percentage: 30% Ambulation/Gait Ambulation/Gait Assistance: Not tested (comment)    ADL:    Cognition: Cognition Arousal/Alertness: Awake/alert Orientation Level: Oriented X4 Cognition Overall Cognitive Status: Appears within functional limits for tasks assessed/performed Arousal/Alertness: Awake/alert Orientation Level: Appears intact for tasks assessed Behavior During Session: [facility] for tasks performed  Blood pressure 122/43, pulse 58, temperature 97.8 F (36.6 C), temperature source Oral, resp. rate 18, height 5\' 6"  (1.676 m), weight 69.446 kg (153 lb 1.6 oz), SpO2 100.00%. Physical Exam  Nursing note and vitals reviewed. Constitutional: He is oriented to person, place, and time.       Frail appearing, elderly male  HENT:  Head: Normocephalic and atraumatic.  Eyes: Pupils are equal, round, and reactive to light.   Neck: Normal range of motion.  Cardiovascular: Normal rate and regular rhythm.   Pulmonary/Chest: Effort normal and breath sounds normal.  Abdominal: Soft. Bowel sounds are normal.  Musculoskeletal: He exhibits tenderness (right amp site).       Right BK in [place]. It is dressed. Area is appropriately tender.   Neurological: He is alert and oriented to person, place, and time. He has normal strength and normal reflexes. No cranial nerve deficit or sensory deficit. He displays a negative Romberg sign.       UE grossly 5/5. LLE grossly 4/5. Able to lift RLE against gravity.  Skin: Skin is warm.  Psychiatric: He has a normal mood and affect. His behavior is normal. Judgment and thought content normal.    Results for  orders placed during the hospital encounter of 12/15/11 (from the past 24 hour(s))  VITAMIN B12     Status: Abnormal   Collection Time   12/16/11  2:21 PM      Component Value Range   Vitamin B-12 950 (*) 211 - 911 pg/mL  FOLATE     Status: Normal   Collection Time   12/16/11  2:21 PM      Component Value Range   Folate >20.0    IRON AND TIBC     Status: Abnormal   Collection Time   12/16/11  2:21 PM      Component Value Range   Iron 144 (*) 42 - 135 ug/dL   TIBC NOT CALC  981 - 435 ug/dL   Saturation Ratios NOT CALC  20 - 55 %   UIBC <15 (*) 125 - 400 ug/dL  FERRITIN     Status: Normal   Collection Time   12/16/11  2:21 PM      Component Value Range   Ferritin 264  22 - 322 ng/mL  RETICULOCYTES     Status: Abnormal   Collection Time   12/16/11  2:21 PM      Component Value Range   Retic Ct Pct 1.7  0.4 - 3.1 %   RBC. 3.65 (*) 4.22 - 5.81 MIL/uL   Retic Count, Manual 62.1  19.0 - 186.0 K/uL  HEMOGLOBIN AND HEMATOCRIT, BLOOD     Status: Abnormal   Collection Time   12/16/11  2:21 PM      Component Value Range   Hemoglobin 10.0 (*) 13.0 - 17.0 g/dL   HCT 19.1 (*) 47.8 - 29.5 %  LACTATE DEHYDROGENASE     Status: Normal   Collection Time   12/16/11  2:21 PM       Component Value Range   LDH 168  94 - 250 U/L  HAPTOGLOBIN     Status: Abnormal   Collection Time   12/16/11  2:21 PM      Component Value Range   Haptoglobin 203 (*) 45 - 215 mg/dL  PATHOLOGIST SMEAR REVIEW     Status: Normal   Collection Time   12/16/11  2:21 PM      Component Value Range   Path Review MILD ANEMIA WITH    GLUCOSE, CAPILLARY     Status: Abnormal   Collection Time   12/16/11  4:46 PM      Component Value Range   Glucose-Capillary 146 (*) 70 - 99 mg/dL  HEMOGLOBIN AND HEMATOCRIT, BLOOD     Status: Abnormal   Collection Time   12/16/11  8:47 PM      Component Value Range   Hemoglobin 9.3 (*) 13.0 - 17.0 g/dL   HCT 62.1 (*) 30.8 - 65.7 %  GLUCOSE, CAPILLARY     Status: Abnormal   Collection Time   12/16/11  9:10 PM      Component Value Range   Glucose-Capillary 153 (*) 70 - 99 mg/dL  CBC     Status: Abnormal   Collection Time   12/17/11  3:01 AM      Component Value Range   WBC 7.0  4.0 - 10.5 K/uL   RBC 3.45 (*) 4.22 - 5.81 MIL/uL   Hemoglobin 9.3 (*) 13.0 - 17.0 g/dL   HCT 84.6 (*) 96.2 - 95.2 %   MCV 82.0  78.0 - 100.0 fL   MCH 27.0  26.0 - 34.0 pg   MCHC 32.9  30.0 - 36.0 g/dL   RDW 11.9 (*) 14.7 - 82.9 %   Platelets 134 (*) 150 - 400 K/uL  BASIC METABOLIC PANEL     Status: Abnormal   Collection Time   12/17/11  3:01 AM      Component Value Range   Sodium 136  135 - 145 mEq/L   Potassium 4.5  3.5 - 5.1 mEq/L   Chloride 105  96 - 112 mEq/L   CO2 26  19 - 32 mEq/L   Glucose, Bld 143 (*) 70 - 99 mg/dL   BUN 22  6 - 23 mg/dL   Creatinine, Ser 5.62  0.50 - 1.35 mg/dL   Calcium 8.4  8.4 - 13.0 mg/dL   GFR calc non Af Amer 58 (*) >90 mL/min   GFR calc Af Amer 68 (*) >90 mL/min  GLUCOSE, CAPILLARY     Status: Abnormal   Collection Time   12/17/11  7:39 AM      Component Value Range   Glucose-Capillary 192 (*) 70 - 99 mg/dL   Comment 1 Notify RN    GLUCOSE, CAPILLARY     Status: Abnormal   Collection Time   12/17/11 12:24 PM      Component Value Range    Glucose-Capillary 193 (*) 70 - 99 mg/dL   Comment 1 Notify RN    CBC     Status: Abnormal   Collection Time   12/17/11  1:12 PM      Component Value Range   WBC 7.7  4.0 - 10.5 K/uL   RBC 3.35 (*) 4.22 - 5.81 MIL/uL   Hemoglobin 9.2 (*) 13.0 - 17.0 g/dL   HCT 86.5 (*) 78.4 - 69.6 %   MCV 82.7  78.0 - 100.0 fL   MCH 27.5  26.0 - 34.0 pg   MCHC 33.2  30.0 - 36.0 g/dL   RDW 29.5 (*) 28.4 - 13.2 %   Platelets 132 (*) 150 - 400 K/uL   Dg Chest 2 View  12/15/2011  *RADIOLOGY REPORT*  Clinical Data: 78 year old male with cough.  Preoperative respiratory examination for right foot surgery.  CHEST - 2 VIEW  Comparison: None  Findings: Cardiomegaly and evidence of cardiac valve replacement noted. Mild interstitial prominence is noted. There is no evidence of focal airspace disease, pulmonary edema, suspicious pulmonary nodule/mass, pleural effusion, or pneumothorax. No acute bony abnormalities are identified.  IMPRESSION: Interstitial prominence - probably chronic but of uncertain chronicity.  Cardiomegaly without other significant abnormalities identified.   Original Report Authenticated By: Rosendo Gros, M.D.    Dg Chest Port 1 View  12/16/2011  *RADIOLOGY REPORT*  Clinical Data: 78 year old male status post below-the-knee amputation.  Productive cough.  PORTABLE CHEST - 1 VIEW  Comparison: 12/15/2011.  Findings: Semi upright AP portable view 1252 hours.  Diffuse increased interstitial markings.  Confluent retrocardiac opacity. Probable small left pleural effusion.  Stable cardiac size and mediastinal contours.  Sequelae of CABG.  No pneumothorax.  IMPRESSION: Acute pulmonary edema suspected with small left pleural effusion and retrocardiac atelectasis.   Original Report Authenticated By: Harley Hallmark, M.D.     Assessment/Plan: Diagnosis: Right BKA 1. Does the need for close, 24 hr/day medical supervision in concert with the patient's rehab needs make it unreasonable for this patient to be  served in a less intensive setting? Yes 2. Co-Morbidities requiring supervision/potential complications: htn, DM, AVR 3. Due to bladder management, bowel management, safety, skin/wound care, disease management, medication administration, pain management  and patient education, does the patient require 24 hr/day rehab nursing? Yes 4. Does the patient require coordinated care of a physician, rehab nurse, PT (1-2 hrs/day, 5 days/week) and OT (1-2 hrs/day, 5 days/week) to address physical and functional deficits in the context of the above medical diagnosis(es)? Yes Addressing deficits in the following areas: balance, endurance, locomotion, strength, transferring, bowel/bladder control, bathing, dressing, feeding, grooming and toileting 5. Can the patient actively participate in an intensive therapy program of at least 3 hrs of therapy per day at least 5 days per week? Yes 6. The potential for patient to make measurable gains while on inpatient rehab is excellent 7. Anticipated functional outcomes upon discharge from inpatient rehab are mod I to supevision with PT, mod I to min assist with OT, n/a with SLP. 8. Estimated rehab length of stay to reach the above functional goals is: 7-10 days 9. Does the patient have adequate social supports to accommodate these discharge functional goals? Yes 10. Anticipated D/C setting: Home 11. Anticipated post D/C treatments: HH therapy 12. Overall Rehab/Functional Prognosis: good  RECOMMENDATIONS: This patient's condition is appropriate for continued rehabilitative care in the following setting: CIR Patient has agreed to participate in recommended program. Yes Note that insurance prior authorization may be required for reimbursement for recommended care.  Comment: Rehab RN to follow up.   ZTS    12/17/2011

## 2011-12-17 NOTE — Progress Notes (Signed)
[facility]                                                                                Patient Demographics  Jamie Romero, is a 78 y.o. male  RUE:[id number]  JYN:[id number]  DOB - [birth year]  Admit date - 12/15/2011  Admitting Physician Toni Arthurs, MD  Outpatient Primary MD for the patient is Judie Petit, MD  LOS - 2   Chief Complaint  Patient presents with  . Dr. Victorino Dike Possible amputation to RLE tomorrow         Assessment & Plan   Patient admitted yesterday for right foot diabetic ulcer with [place] to mellitus requiring BKA on 12/16/2011 by Dr. Toni Arthurs orthopedics, patient tolerated the procedure well he does have history of diabetes mellitus and anemia of chronic disease, a slight hemoglobin was around 7.2-7.5, when patient was admitted since he was going to OR he required 2 units of packed RBC transfusion to be optimized for surgery, post transfusion H&H appears stable, he has no signs of active bleeding from his postop site or his GI tract.   1. Diabetic foot ulcer with right foot osteomyelitis - status post right BKA  on 12/16/2011. As the infected area has been removed surgically will give him 1 more days of IV antibiotics and thereafter stop. Will initiate PT evaluated will consult CIR.   2. DM2 - resume home dose glipizide + ISS  CBG (last 3)   Basename 12/17/11 0739 12/16/11 2110 12/16/11 1646  GLUCAP 192* 153* 146*      3. H/o hyperlipidemia, HTN - continue home meds for these     4. Anemia - hemoglobin was noted to be 7.7 few months ago. Patient is getting 2 units transfusion on 12/16/2011, his MCV MCH and MCHC are stable, anemia panel is inconclusive, LDH was normal and haptoglobin in fact was high ruling out hemolysis, anemia likely anemia of chronic disease. Outpatient monitoring by PCP recommended at this time no acute issues.     Code Status: Full  Family Communication: Cast with patient and his children  bedside  Disposition Plan: CIR consulted per PT request    Procedures right BKA done by Dr. Victorino Dike 12/16/2011   Consults  orthopedics   Time Spent in minutes   35   Antibiotics    Anti-infectives     Start     Dose/Rate Route Frequency Ordered Stop   12/16/11 1000   vancomycin (VANCOCIN) 750 mg in sodium chloride 0.9 % 150 mL IVPB        750 mg 150 mL/hr over 60 Minutes Intravenous Every 12 hours 12/15/11 2255     12/15/11 2300   vancomycin (VANCOCIN) IVPB 1000 mg/200 mL premix        1,000 mg 200 mL/hr over 60 Minutes Intravenous  Once 12/15/11 2251 12/16/11 0005   12/15/11 2245   ceFEPIme (MAXIPIME) 1 g in dextrose 5 % 50 mL IVPB        1 g 100 mL/hr over 30 Minutes Intravenous Every 12 hours 12/15/11 2232            Scheduled Meds:    . aspirin  81 mg  Oral Daily  . atorvastatin  10 mg Oral Daily  . ceFEPime (MAXIPIME) IV  1 g Intravenous Q12H  . Chlorhexidine Gluconate Cloth  6 each Topical Q0600  . digoxin  125 mcg Oral Daily  . ferrous sulfate  325 mg Oral BID  . furosemide  20 mg Intravenous Once  . furosemide  20 mg Oral Daily  . glipiZIDE  10 mg Oral Daily  . heparin subcutaneous  5,000 Units Subcutaneous Q8H  . insulin aspart  0-9 Units Subcutaneous TID WC  . multivitamin with minerals  1 tablet Oral Daily  . mupirocin ointment  1 application Nasal BID  . pantoprazole (PROTONIX) IV  40 mg Intravenous Q12H  . ranolazine  500 mg Oral BID  . terazosin  10 mg Oral QHS  . vancomycin  750 mg Intravenous Q12H  . vitamin C  500 mg Oral Daily  . vitamin E  400 Units Oral Daily  . zinc sulfate  220 mg Oral Daily  . DISCONTD: furosemide  20 mg Oral Daily  . DISCONTD: insulin aspart  0-9 Units Subcutaneous Q4H  . DISCONTD: sodium chloride  3 mL Intravenous Q12H   Continuous Infusions:    . dextrose 5 % and 0.45% NaCl 50 mL/hr at 12/16/11 1407   PRN Meds:.HYDROcodone-acetaminophen, HYDROmorphone (DILAUDID) injection, metoCLOPramide (REGLAN) injection,  metoCLOPramide, ondansetron (ZOFRAN) IV, ondansetron, DISCONTD: sodium chloride, DISCONTD:  HYDROmorphone (DILAUDID) injection, DISCONTD: meperidine (DEMEROL) injection, DISCONTD: midazolam, DISCONTD: promethazine, DISCONTD: sodium chloride   DVT Prophylaxis  SCDs and heparin  Lab Results  Component Value Date   PLT 134* 12/17/2011      Susa Raring K M.D on 12/17/2011 at 12:36 PM  Between 7am to 7pm - Pager - [phone]  After 7pm go to [email or web address] - password [email or web address]  And look for the night coverage person covering for me after hours  [facility] Office  [phone]    Subjective:   Kuzey Ogata today has, No headache, No chest pain, No abdominal pain - No Nausea, No new weakness tingling or numbness, No Cough - SOB.   Objective:   Filed Vitals:   12/16/11 2112 12/17/11 0137 12/17/11 0616 12/17/11 0941  BP: 109/48 106/47 104/58 122/43  Pulse: 60 64 62 58  Temp: 97.3 F (36.3 C) 97.3 F (36.3 C) 97.6 F (36.4 C) 97.8 F (36.6 C)  TempSrc: Oral Oral Oral Oral  Resp: 18 18 16 18   Height:      Weight:      SpO2: 97% 98% 96% 100%    Wt Readings from Last 3 Encounters:  12/16/11 69.446 kg (153 lb 1.6 oz)  12/16/11 69.446 kg (153 lb 1.6 oz)  12/06/11 77.111 kg (170 lb)     Intake/Output Summary (Last 24 hours) at 12/17/11 1236 Last data filed at 12/17/11 0900  Gross per 24 hour  Intake    780 ml  Output    650 ml  Net    130 ml    Exam Awake Alert, Oriented X 3, No new F.N deficits, Normal affect [place].AT,PERRAL Supple Neck,No JVD, No cervical lymphadenopathy appriciated.  Symmetrical Chest wall movement, Good air movement bilaterally, CTAB RRR,No Gallops,Rubs or new Murmurs, No Parasternal Heave +ve B.Sounds, Abd Soft, Non tender, No organomegaly appriciated, No rebound - guarding or rigidity. No Cyanosis, Clubbing or edema, No new Rash or bruise, right BKA stump in bandage   Data Review   Micro Results Recent Results (from the past  240 hour(s))  SURGICAL PCR SCREEN  Status: Abnormal   Collection Time   12/16/11  4:13 AM      Component Value Range Status Comment   MRSA, PCR POSITIVE (*) NEGATIVE Final    Staphylococcus aureus POSITIVE (*) NEGATIVE Final     Radiology Reports Dg Chest 2 View  12/15/2011  *RADIOLOGY REPORT*  Clinical Data: 78 year old male with cough.  Preoperative respiratory examination for right foot surgery.  CHEST - 2 VIEW  Comparison: None  Findings: Cardiomegaly and evidence of cardiac valve replacement noted. Mild interstitial prominence is noted. There is no evidence of focal airspace disease, pulmonary edema, suspicious pulmonary nodule/mass, pleural effusion, or pneumothorax. No acute bony abnormalities are identified.  IMPRESSION: Interstitial prominence - probably chronic but of uncertain chronicity.  Cardiomegaly without other significant abnormalities identified.   Original Report Authenticated By: Rosendo Gros, M.D.     CBC  Lab 12/17/11 0301 12/16/11 2047 12/16/11 1421 12/16/11 1158 12/16/11 0505 12/15/11 1728  WBC 7.0 -- -- -- 6.3 10.8*  HGB 9.3* 9.3* 10.0* 9.2* 6.8* --  HCT 28.3* 27.7* 30.1* 27.0* 20.5* --  PLT 134* -- -- -- 154 221  MCV 82.0 -- -- -- 80.1 81.3  MCH 27.0 -- -- -- 26.6 25.5*  MCHC 32.9 -- -- -- 33.2 31.3  RDW 15.6* -- -- -- 15.7* 16.3*  LYMPHSABS -- -- -- -- -- 0.6*  MONOABS -- -- -- -- -- 0.8  EOSABS -- -- -- -- -- 0.0  BASOSABS -- -- -- -- -- 0.0  BANDABS -- -- -- -- -- --    Chemistries   Lab 12/17/11 0301 12/16/11 1158 12/16/11 0620 12/15/11 1728  NA 136 140 138 139  K 4.5 4.5 4.4 5.2*  CL 105 -- 106 105  CO2 26 -- 26 24  GLUCOSE 143* 86 95 182*  BUN 22 -- 24* 27*  CREATININE 1.10 -- 1.19 1.27  CALCIUM 8.4 -- 8.8 9.6  MG -- -- -- --  AST -- -- -- 25  ALT -- -- -- 16  ALKPHOS -- -- -- 172*  BILITOT -- -- -- 0.6   ------------------------------------------------------------------------------------------------------------------ estimated  creatinine clearance is 42.7 ml/min (by C-G formula based on Cr of 1.1). ------------------------------------------------------------------------------------------------------------------ No results found for this basename: HGBA1C:2 in the last 72 hours ------------------------------------------------------------------------------------------------------------------ No results found for this basename: CHOL:2,HDL:2,LDLCALC:2,TRIG:2,CHOLHDL:2,LDLDIRECT:2 in the last 72 hours ------------------------------------------------------------------------------------------------------------------ No results found for this basename: TSH,T4TOTAL,FREET3,T3FREE,THYROIDAB in the last 72 hours ------------------------------------------------------------------------------------------------------------------  [facility] 12/16/11 1421  VITAMINB12 950*  FOLATE >20.0  FERRITIN 264  TIBC NOT CALC  IRON 144*  RETICCTPCT 1.7    Coagulation profile  Lab 12/16/11 0505  INR 1.56*  PROTIME --    No results found for this basename: DDIMER:2 in the last 72 hours  Cardiac Enzymes No results found for this basename: CK:3,CKMB:3,TROPONINI:3,MYOGLOBIN:3 in the last 168 hours ------------------------------------------------------------------------------------------------------------------ No components found with this basename: POCBNP:3

## 2011-12-17 NOTE — Progress Notes (Addendum)
Rehab Admissions Coordinator Note:  Patient was screened by Clois Dupes for appropriateness for an Inpatient Acute Rehab Consult.  I met with patient at bedside. He reports he previously went to  a  SNF in [place] about 5 weeks ago for rehab for about 3 weeks. I have placed a call to his son, Jillyn Hidden, to ask if he would prefer me to pursue an inpt rehab admission here next week if he qualifies or if he would prefer pt to be at a SNF closer to home. I await his call back. PTA patient uses a manual wheelchair in his home and transfers independendtly. Uses a walker for short distances. Uses an electric wheelchair in the community. 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 Son is an amputee also. At this time, we are recommending Inpatient Rehab consult. I will contact Dr. Thedore Mins for the consult.  Clois Dupes 12/17/2011, 12:23 PM  [phone]   I spoke with son and he prefers inpt rehab here rather than SNF. Patient was walking with walker well prior to January. Used walker in house and electric wheelchair in community.

## 2011-12-17 NOTE — Progress Notes (Signed)
Physical Therapy Treatment Patient Details Name: Jamie Romero MRN: [id number] DOB: Jul 08, 1922 Today's Date: 12/17/2011 Time: 1478-2956 PT Time Calculation (min): 23 min  PT Assessment / Plan / Recommendation Comments on Treatment Session  Pt with increased fatigue this session. Completed transfer using Steady for safety and increased UE strength. Suggest [place] Dec Plus next session for increased assistance.     Follow Up Recommendations    Yes, Recommend IP Rehab Screening    Barriers to Discharge        Equipment Recommendations  3 in 1 bedside comode    Recommendations for Other Services Rehab consult  Frequency Min 4X/week   Plan Discharge plan remains appropriate;Frequency remains appropriate    Precautions / Restrictions Precautions Precautions: Fall Restrictions Weight Bearing Restrictions: Yes RLE Weight Bearing: Non weight bearing   Pertinent Vitals/Pain Pt with 6/10 pain .     Mobility  Bed Mobility Bed Mobility: Sit to Supine Sit to Supine: 4: Min assist;HOB flat Details for Bed Mobility Assistance: assist to support trunk into bed Transfers Transfers: Sit to Stand;Stand to Sit Sit to Stand: 1: +2 Total assist;From chair/3-in-1;With upper extremity assist Sit to Stand: Patient Percentage: 40% Stand to Sit: 1: +2 Total assist;To bed;To chair/3-in-1;With upper extremity assist;With armrests Stand to Sit: Patient Percentage: 40% Transfer via Lift Equipment: Stedy Details for Transfer Assistance: Pt performed sit<>stand x2 with max facilitation through bil posterior hips for full upright posture. Required hand over hand to obtain safe hand placement for sit<>stand from chair to stedy.  Ambulation/Gait Ambulation/Gait Assistance: Not tested (comment)    Exercises     PT Diagnosis:    PT Problem List:   PT Treatment Interventions:     PT Goals Acute Rehab PT Goals PT Goal: Sit to Supine/Side - Progress: Progressing toward goal PT Goal: Sit to Stand -  Progress: Progressing toward goal PT Goal: Stand to Sit - Progress: Progressing toward goal PT Transfer Goal: Bed to Chair/Chair to Bed - Progress: Progressing toward goal  Visit Information  Last PT Received On: 12/17/11 Assistance Needed: +2    Subjective Data      Cognition  Overall Cognitive Status: Impaired Area of Impairment: Following commands Arousal/Alertness: Awake/alert Orientation Level: Appears intact for tasks assessed Behavior During Session: [facility] for tasks performed Following Commands: Follows multi-step commands with increased time Cognition - Other Comments: Required increased processing time during questions/commands.      Balance     End of Session     GP     Jamie Romero 12/17/2011, 4:25 PM

## 2011-12-17 NOTE — Op Note (Signed)
[name], [name] NO.:  [id number]  MEDICAL RECORD NO.:  [id number]  LOCATION:  6N22C                        FACILITY:  [facility]  PHYSICIAN:  Toni Arthurs, MD        DATE OF BIRTH:  05-17-1922  DATE OF PROCEDURE:  12/16/2011 DATE OF DISCHARGE:                              OPERATIVE REPORT   PREOPERATIVE DIAGNOSES: 1. Right foot gangrene. 2. Right calcaneus osteomyelitis.  POSTOPERATIVE DIAGNOSES: 1. Right foot gangrene. 2. Right calcaneus osteomyelitis.  PROCEDURE:  Right below-knee amputation.  SURGEON:  Toni Arthurs, MD  ANESTHESIA:  General.  ESTIMATED BLOOD LOSS:  Minimal.  TOURNIQUET TIME:  36 minutes at 250 mmHg.  COMPLICATIONS:  None apparent.  DISPOSITION:  Extubated awake and stable to recovery.  INDICATION FOR PROCEDURE:  The patient is an 78 year old male with a 10- month history of heel ulcer on his right foot.  An MRI reveals calcaneal osteomyelitis who presented to clinic yesterday with wet gangrene of the heel.  He presents now for right below-knee amputation.  He understands the risks and benefits, the alternative treatment options and elects surgical treatment.  He specifically understands risks of bleeding, infection, nerve damage, blood clots, need for additional surgery, revision, amputation, and death.  PROCEDURE IN DETAIL:  After preoperative consent was obtained, the correct operative site was identified.  The patient was brought to the operating room and placed supine on the operating table.  General anesthesia was induced.  Preoperative antibiotics were administered. Surgical time-out was taken.  Right lower extremity was prepped and draped in standard sterile fashion with a tourniquet around the thigh. The extremity was exsanguinated and tourniquet was inflated to 250 mmHg. A posterior flap incision was marked on the skin.  The incision was made.  Sharp dissection was carried down through the skin and subcutaneous tissue  to the level of the knee anterior tibia.  The periosteum was elevated.  Reciprocating saw was used to cut through the tibia approximately 1 cm proximal from the incision.  The fibula was then exposed and cut another cm proximal to the tibial cut.  An amputation knife was then used to cut through the posterior compartment musculature contouring the posterior flap appropriately.  The leg was passed off the field as a specimen to Pathology.  The named vessels were all doubly suture ligated or cauterized.  The nerves were sharply divided, allowed to retract into healthy tissue.  The wound was irrigated copiously.  The cut surface of the tibia was beveled with the rasp and saw.  The wound was again irrigated.  The gastrocnemius fascia was then repaired to the anterior tibial periosteum with imbricating sutures of 0-PDS.  The fascia was closed with 0-figure-of-eight sutures of PDS.  The subcutaneous tissue was approximated with inverted simple sutures of 3-0 Monocryl.  The skin was closed with staples.  Sterile dressings were applied followed by a compression wrap.  Tourniquet was released at 36 minutes.  A knee immobilizer was applied.  The patient was awakened from anesthesia and transported to the recovery room in stable condition.  FOLLOWUP PLAN:  The patient will continue on IV vancomycin per the primary team.  He will be nonweightbearing on  his right lower extremity.     Toni Arthurs, MD     JH/MEDQ  D:  12/16/2011  T:  12/17/2011  Job:  [id number]

## 2011-12-18 LAB — GLUCOSE, CAPILLARY
Glucose-Capillary: 55 mg/dL — ABNORMAL LOW (ref 70–99)
Glucose-Capillary: 71 mg/dL (ref 70–99)
Glucose-Capillary: 118 mg/dL — ABNORMAL HIGH (ref 70–99)
Glucose-Capillary: 160 mg/dL — ABNORMAL HIGH (ref 70–99)

## 2011-12-18 LAB — BASIC METABOLIC PANEL
BUN: 18 mg/dL (ref 6–23)
CO2: 27 mEq/L (ref 19–32)
Chloride: 104 mEq/L (ref 96–112)
Creatinine, Ser: 1 mg/dL (ref 0.50–1.35)
GFR calc Af Amer: 76 mL/min — ABNORMAL LOW (ref >90)
Potassium: 4.4 mEq/L (ref 3.5–5.1)

## 2011-12-18 MED ORDER — PANTOPRAZOLE SODIUM 40 MG PO TBEC
40.0000 mg | DELAYED_RELEASE_TABLET | Freq: Two times a day (BID) | ORAL | Status: DC
  Administered 2011-12-18 – 2011-12-20 (×4): 40 mg via ORAL
  Filled 2011-12-18 (×3): qty 1

## 2011-12-18 MED ORDER — HYDROCORTISONE 0.5 % EX CREA
TOPICAL_CREAM | Freq: Two times a day (BID) | CUTANEOUS | Status: DC
  Administered 2011-12-18 – 2011-12-19 (×3): via TOPICAL
  Filled 2011-12-18: qty 28.35

## 2011-12-18 MED ORDER — GLUCERNA SHAKE PO LIQD
237.0000 mL | Freq: Three times a day (TID) | ORAL | Status: DC
  Administered 2011-12-18: 237 mL via ORAL
  Administered 2011-12-18: 16:00:00 via ORAL
  Administered 2011-12-19 – 2011-12-20 (×3): 237 mL via ORAL

## 2011-12-18 MED ORDER — HYDROMORPHONE HCL PF 1 MG/ML IJ SOLN: 0.5000 mg | Freq: Three times a day (TID) | INTRAMUSCULAR | Status: DC | PRN

## 2011-12-18 NOTE — Progress Notes (Signed)
    Subjective: 2 Days Post-Op Procedure(s) (LRB): AMPUTATION BELOW KNEE (Right) Patient reports pain as tolerable.  Reports 'every once and a while I need a pain pill' Denies CP or SOB.    Objective: Vital signs in last 24 hours: Temp:  [97.3 F (36.3 C)-97.9 F (36.6 C)] 97.8 F (36.6 C) (10/05 0553) Pulse Rate:  [58-71] 64  (10/05 0553) Resp:  [17-19] 18  (10/05 0553) BP: (100-125)/(43-60) 100/59 mmHg (10/05 0553) SpO2:  [95 %-100 %] 96 % (10/05 0553)  Intake/Output from previous day: 10/04 0701 - 10/05 0700 In: 360 [P.O.:360] Out: 600 [Urine:600] Intake/Output this shift:    Labs:  Basename 12/17/11 1312 12/17/11 0301 12/16/11 2047 12/16/11 1421 12/16/11 1158  HGB 9.2* 9.3* 9.3* 10.0* 9.2*    Basename 12/17/11 1312 12/17/11 0301  WBC 7.7 7.0  RBC 3.35* 3.45*  HCT 27.7* 28.3*  PLT 132* 134*    Basename 12/17/11 0301 12/16/11 1158 12/16/11 0620  NA 136 140 --  K 4.5 4.5 --  CL 105 -- 106  CO2 26 -- 26  BUN 22 -- 24*  CREATININE 1.10 -- 1.19  GLUCOSE 143* 86 --  CALCIUM 8.4 -- 8.8    Basename 12/16/11 0505  LABPT --  INR 1.56*    Physical Exam: Knee immobilizer in place right LE Dressing right LE remain clean and dry  Assessment/Plan: 2 Days Post-Op Procedure(s) (LRB): AMPUTATION BELOW KNEE (Right) Continue care at this time Definitive plan per Dr. Evangeline Dakin for Dr. Venita Lick [facility] [phone] 12/18/2011, 8:59 AM

## 2011-12-18 NOTE — Progress Notes (Signed)
ANTIBIOTIC CONSULT NOTE - FOLLOW UP  Pharmacy Consult for Vanco/Cefepime Indication: Osteo  No Known Allergies  Patient Measurements: Height: 5\' 6"  (167.6 cm) Weight: 153 lb 1.6 oz (69.446 kg) IBW/kg (Calculated) : 63.8  Adjusted Body Weight:   Vital Signs: Temp: 97.8 F (36.6 C) (10/05 0553) Temp src: Oral (10/05 0553) BP: 100/59 mmHg (10/05 0553) Pulse Rate: 64  (10/05 0553) Intake/Output from previous day: 10/04 0701 - 10/05 0700 In: 360 [P.O.:360] Out: 600 [Urine:600] Intake/Output from this shift:    Labs:  Basename 12/17/11 1312 12/17/11 0301 12/16/11 2047 12/16/11 0620 12/16/11 0505 12/15/11 1728  WBC 7.7 7.0 -- -- 6.3 --  HGB 9.2* 9.3* 9.3* -- -- --  PLT 132* 134* -- -- 154 --  LABCREA -- -- -- -- -- --  CREATININE -- 1.10 -- 1.19 -- 1.27   Estimated Creatinine Clearance: 42.7 ml/min (by C-G formula based on Cr of 1.1). No results found for this basename: VANCOTROUGH:2,VANCOPEAK:2,VANCORANDOM:2,GENTTROUGH:2,GENTPEAK:2,GENTRANDOM:2,TOBRATROUGH:2,TOBRAPEAK:2,TOBRARND:2,AMIKACINPEAK:2,AMIKACINTROU:2,AMIKACIN:2, in the last 72 hours   Microbiology: Recent Results (from the past 720 hour(s))  SURGICAL PCR SCREEN     Status: Abnormal   Collection Time   12/16/11  4:13 AM      Component Value Range Status Comment   MRSA, PCR POSITIVE (*) NEGATIVE Final    Staphylococcus aureus POSITIVE (*) NEGATIVE Final     Anti-infectives     Start     Dose/Rate Route Frequency Ordered Stop   12/16/11 1000   vancomycin (VANCOCIN) 750 mg in sodium chloride 0.9 % 150 mL IVPB        750 mg 150 mL/hr over 60 Minutes Intravenous Every 12 hours 12/15/11 2255     12/15/11 2300   vancomycin (VANCOCIN) IVPB 1000 mg/200 mL premix        1,000 mg 200 mL/hr over 60 Minutes Intravenous  Once 12/15/11 2251 12/16/11 0005   12/15/11 2245   ceFEPIme (MAXIPIME) 1 g in dextrose 5 % 50 mL IVPB        1 g 100 mL/hr over 30 Minutes Intravenous Every 12 hours 12/15/11 2232             Assessment: 78 yo male admitted with diabetic foot ulcer with r/o osteomyelitis. Patient with wound culture from 9/23 that showed E. cloacae, K. Pneumoniae both sensitive to cefepime and  S. aureus (MRSA), and E. faecalis.From 9/23 wound culture,S/p BKA 10/3 for R foot osteo.  Anticoag: none pta. SCDs and SQ heparin ID: Afebrile. WBC 7.7. Scr 1.1. MD notes plan to stop abx today. Cefepime 10/2>> Vanco 10/2>>  Cards: : 100/59 on ASA 81mg , Lipitor, digoxin, Lasix po, Ranexa, Hytrin Endo: CBGs 160-193 all yesterday. Meds: SSI, glipizide, GI/Nutrition: GERD on IV PPI, MV, Vit C, E, Iron, and Zinc Neuro: Renal: stable Pulm: RA Heme/Onc: Hg 9.3, PTLC 134 Anemia of chronic disease PTA Med Issues: lisinopril held  Goal of Therapy:  Vancomycin trough level 15-20 mcg/ml  Plan:  - Vancomycin 750mg  IV Q12H - Cefepime 1g/12h MD plans to stop abx today   Zaine Elsass S. Merilynn Finland, PharmD, BCPS Clinical Staff Pharmacist Pager [phone]  Misty Stanley Stillinger 12/18/2011,7:41 AM

## 2011-12-18 NOTE — Progress Notes (Signed)
Physical Therapy Treatment Patient Details Name: Jamie Romero MRN: [id number] DOB: 1923-02-12 Today's Date: 12/18/2011 Time: 3086-5784 PT Time Calculation (min): 28 min  PT Assessment / Plan / Recommendation Comments on Treatment Session  Pt able to tolerate increased knee flexion this session, although still unable to tolerate full knee flexion and will not tolerate any PROM. Continue per plan, increasing UE strength and LLE strength for pre-gait activities.     Follow Up Recommendations        Does the patient have the potential to tolerate intense rehabilitation  Yes, Recommend IP Rehab Screening  Barriers to Discharge        Equipment Recommendations  3 in 1 bedside comode    Recommendations for Other Services Rehab consult  Frequency Min 4X/week   Plan Discharge plan remains appropriate;Frequency remains appropriate    Precautions / Restrictions Precautions Precautions: Fall Restrictions Weight Bearing Restrictions: Yes RLE Weight Bearing: Non weight bearing   Pertinent Vitals/Pain 8/10 pain with movements.     Mobility  Bed Mobility Bed Mobility: Supine to Sit;Sitting - Scoot to Edge of Bed Supine to Sit: 4: Min guard Sitting - Scoot to [place] of Bed: 4: Min guard Transfers Transfers: Sit to Stand;Stand to Sit Sit to Stand: 1: +2 Total assist;From chair/3-in-1;With upper extremity assist Sit to Stand: Patient Percentage: 50% Stand to Sit: 1: +2 Total assist;To bed;To chair/3-in-1;With upper extremity assist;With armrests Stand to Sit: Patient Percentage: 50% Transfer via Lift Equipment: Stedy Details for Transfer Assistance: Pt performed sit<>stand with max facilitation through bil posterior hips for full upright posture. Required hand over hand to obtain safe hand placement for sit<>stand from chair to stedy.  Pt with increased difficulty maintaining standing on LLE secondary to fatigue Ambulation/Gait Ambulation/Gait Assistance: Not tested (comment)      Exercises Amputee Exercises Knee Flexion: AROM;Strengthening;Right;10 reps;Seated;Limitations Knee Flexion Limitations: unable to achieve full ROM, no PROM given secondary to pain   PT Diagnosis:    PT Problem List:   PT Treatment Interventions:     PT Goals Acute Rehab PT Goals PT Goal Formulation: With patient PT Goal: Supine/Side to Sit - Progress: Progressing toward goal PT Goal: Sit to Supine/Side - Progress: Progressing toward goal PT Goal: Sit to Stand - Progress: Progressing toward goal PT Goal: Stand to Sit - Progress: Progressing toward goal PT Transfer Goal: Bed to Chair/Chair to Bed - Progress: Progressing toward goal PT Goal: Perform Home Exercise Program - Progress: Progressing toward goal  Visit Information  Last PT Received On: 12/18/11 Assistance Needed: +2    Subjective Data      Cognition  Overall Cognitive Status: Impaired Area of Impairment: Following commands Arousal/Alertness: Awake/alert Orientation Level: Appears intact for tasks assessed Behavior During Session: [facility] for tasks performed Following Commands: Follows multi-step commands with increased time Cognition - Other Comments: Required increased processing time during questions/commands.      Balance     End of Session PT - End of Session Equipment Utilized During Treatment: Gait belt Activity Tolerance: Patient limited by fatigue;Patient limited by pain Patient left: in chair;with call bell/phone within reach Nurse Communication: Mobility status   GP     [name] 12/18/2011, 5:33 PM

## 2011-12-18 NOTE — Progress Notes (Addendum)
[facility] PROGRESS NOTE  Jamie Romero BMW:[id number] DOB: 01-09-1923 DOA: 12/15/2011 PCP: Judie Petit, MD  Assessment/Plan Patient admitted for right foot diabetic ulcer with osteomyelitis requiring BKA on 12/16/2011 by Dr. Toni Arthurs orthopedics, patient tolerated the procedure well. he does have history of diabetes mellitus and anemia of chronic disease, hemoglobin was around 7.2-7.5, when patient was admitted since he was going to OR he required 2 units of packed RBC transfusion to be optimized for surgery, post transfusion H&H appears stable.  1. Diabetic foot ulcer with right foot osteomyelitis - status post right BKA on 12/16/2011. As the infected area has been removed we will stop IV abx today. 2. Possible reaction of cefepime with rash : He has no h/o PCN allergy or any other abx allergy. This is likely rash from cefepime and it should be avoided in the future. Pt clinically stable.   3. DM2 - resume home dose glipizide + ISS 4. Deconditioning : Pt will need inpatient rehab after discharge.  CBG (last 3)   Basename 12/18/11 1217 12/18/11 0810 12/18/11 0747  GLUCAP 118* 71 55*   5.  H/o hyperlipidemia, HTN - continue home meds for these  6. Anemia - HB in range of 9 but stable. Will monitor.  likely anemia of chronic disease. Outpatient monitoring by PCP recommended at this time no acute issues. Will start glucerna   Code Status: full Family Communication: none present int the room.  Disposition Plan: in pt rehab  Consultants:  orthopedics  Procedures: BKA Antibiotics: Finished IV vanc and cefepime on 10/05  HPI/Subjective: Pt has rash of new onset on back and lower chest. He does not itch in those areas. He denies any pain in the stump. He has no h/o PCN or any other abx allergy. No other symptoms noted.   Objective: Filed Vitals:   12/17/11 1757 12/17/11 2116 12/18/11 0553 12/18/11 0959  BP: 117/52 109/59 100/59   Pulse: 71 64 64 65  Temp: 97.7 F  (36.5 C) 97.9 F (36.6 C) 97.8 F (36.6 C)   TempSrc: Oral Oral Oral   Resp: 17 18 18    Height:      Weight:      SpO2: 100% 95% 96%     Intake/Output Summary (Last 24 hours) at 12/18/11 1406 Last data filed at 12/18/11 0553  Gross per 24 hour  Intake    120 ml  Output    400 ml  Net   -280 ml   Filed Weights   12/15/11 2130 12/16/11 0116  Weight: 77.111 kg (170 lb) 69.446 kg (153 lb 1.6 oz)    Exam: Gen : Awake Alert, Oriented X 3, lying in bed Gen : [place].AT, OMM, no thrush Neck : Supple, No JVD, No cervical lymphadenopathy appriciated.  Lungs : Symmetrical Chest wall movement, Good air movement bilaterally, CTAB  CVS : RRR,No Gallops,Rubs or new Murmurs, No Parasternal Heave  Abd : +ve B.Sounds, Abd Soft, Non tender Skin : MP rash over the back and lower chest and upper abdomen noted.  Ext : R BKA noted.   Data Reviewed: Basic Metabolic Panel:  Lab 12/18/11 2725 12/17/11 0301 12/16/11 1158 12/16/11 0620 12/15/11 1728  NA 134* 136 140 138 139  K 4.4 4.5 4.5 4.4 5.2*  CL 104 105 -- 106 105  CO2 27 26 -- 26 24  GLUCOSE 57* 143* 86 95 182*  BUN 18 22 -- 24* 27*  CREATININE 1.00 1.10 -- 1.19 1.27  CALCIUM 8.0* 8.4 --  8.8 9.6  MG -- -- -- -- --  PHOS -- -- -- -- --   Liver Function Tests:  Lab 12/15/11 1728  AST 25  ALT 16  ALKPHOS 172*  BILITOT 0.6  PROT 6.4  ALBUMIN 2.0*   No results found for this basename: LIPASE:5,AMYLASE:5 in the last 168 hours No results found for this basename: AMMONIA:5 in the last 168 hours CBC:  Lab 12/17/11 1312 12/17/11 0301 12/16/11 2047 12/16/11 1421 12/16/11 1158 12/16/11 0505 12/15/11 1728  WBC 7.7 7.0 -- -- -- 6.3 10.8*  NEUTROABS -- -- -- -- -- -- 9.3*  HGB 9.2* 9.3* 9.3* 10.0* 9.2* -- --  HCT 27.7* 28.3* 27.7* 30.1* 27.0* -- --  MCV 82.7 82.0 -- -- -- 80.1 81.3  PLT 132* 134* -- -- -- 154 221   Cardiac Enzymes: No results found for this basename: CKTOTAL:5,CKMB:5,CKMBINDEX:5,TROPONINI:5 in the last 168 hours BNP  (last 3 results) No results found for this basename: PROBNP:3 in the last 8760 hours CBG:  Lab 12/18/11 1217 12/18/11 0810 12/18/11 0747 12/17/11 1656 12/17/11 1224  GLUCAP 118* 71 55* 160* 193*    Recent Results (from the past 240 hour(s))  SURGICAL PCR SCREEN     Status: Abnormal   Collection Time   12/16/11  4:13 AM      Component Value Range Status Comment   MRSA, PCR POSITIVE (*) NEGATIVE Final    Staphylococcus aureus POSITIVE (*) NEGATIVE Final      Studies: No results found.  Scheduled Meds:   . aspirin  81 mg Oral Daily  . atorvastatin  10 mg Oral Daily  . ceFEPime (MAXIPIME) IV  1 g Intravenous Q12H  . Chlorhexidine Gluconate Cloth  6 each Topical Q0600  . digoxin  125 mcg Oral Daily  . diphenhydrAMINE  12.5 mg Intravenous Once  . ferrous sulfate  325 mg Oral BID  . furosemide  20 mg Oral Daily  . glipiZIDE  10 mg Oral Daily  . heparin subcutaneous  5,000 Units Subcutaneous Q8H  . insulin aspart  0-9 Units Subcutaneous TID WC  . multivitamin with minerals  1 tablet Oral Daily  . mupirocin ointment  1 application Nasal BID  . pantoprazole  40 mg Oral BID AC  . ranolazine  500 mg Oral BID  . terazosin  10 mg Oral QHS  . vancomycin  750 mg Intravenous Q12H  . vitamin C  500 mg Oral Daily  . vitamin E  400 Units Oral Daily  . zinc sulfate  220 mg Oral Daily  . DISCONTD: pantoprazole (PROTONIX) IV  40 mg Intravenous Q12H   Continuous Infusions:   Principal Problem:  *Diabetic foot ulcer with osteomyelitis Active Problems:  DIABETES MELLITUS, TYPE II  HYPERLIPIDEMIA  HYPERTENSION  Anemia    Time spent: 30 minutes    Celica Kotowski V.  [facility] Pager [phone]. If 8PM-8AM, please contact night-coverage at [email or web address], password [email or web address] 12/18/2011, 2:06 PM  LOS: 3 days

## 2011-12-19 LAB — TYPE AND SCREEN
ABO/RH(D): A POS
DAT, IgG: NEGATIVE
Unit division: 0
Unit division: 0

## 2011-12-19 LAB — GLUCOSE, CAPILLARY
Glucose-Capillary: 139 mg/dL — ABNORMAL HIGH (ref 70–99)
Glucose-Capillary: 79 mg/dL (ref 70–99)

## 2011-12-19 NOTE — Progress Notes (Signed)
[facility] PROGRESS NOTE  Jamie Romero ZOX:[id number] DOB: 1922/08/20 DOA: 12/15/2011 PCP: Judie Petit, MD  Assessment/Plan: Patient admitted for right foot diabetic ulcer with osteomyelitis requiring BKA on 12/16/2011 by Dr. Toni Arthurs orthopedics, patient tolerated the procedure well. he does have history of diabetes mellitus and anemia of chronic disease, hemoglobin was around 7.2-7.5, when patient was admitted since he was going to OR he required 2 units of packed RBC transfusion to be optimized for surgery, post transfusion H&H appears stable.  1. Diabetic foot ulcer with right foot osteomyelitis - status post right BKA on 12/16/2011. As the infected area has been removed and abx were stopped yesterday.  2. Possible reaction of cefepime with rash : He has no h/o PCN allergy or any other abx allergy. This is likely rash from cefepime and it should be avoided in the future. Pt clinically stable.  3. DM2 - home dose glipizide + ISS 4. Deconditioning : Pt will need inpatient rehab after discharge. CBG (last 3)   Basename 12/19/11 1222 12/19/11 0750 12/18/11 2133  GLUCAP 139* 83 160*     5. H/o hyperlipidemia, HTN - continue home meds for these  6. Anemia - HB in range of 9 but stable. Will monitor. likely anemia of chronic disease. Outpatient monitoring by PCP recommended at this time no acute issues. Will start glucerna   Code Status: full Family Communication: none Disposition Plan: Inpt rehab   Consultants:  Ortho  Procedures:  BKA  Antibiotics: As above  HPI/Subjective: Pt has ongoing rash but not itching. No other issues. Stable.   Objective: Filed Vitals:   12/18/11 1422 12/18/11 2133 12/19/11 0515 12/19/11 1020  BP: 108/58 131/46 105/49   Pulse: 66 68 68 65  Temp: 97.4 F (36.3 C) 98.4 F (36.9 C) 98.2 F (36.8 C)   TempSrc: Oral Oral Oral   Resp: 18 18 16    Height:      Weight:      SpO2: 97% 98% 96%     Intake/Output Summary (Last  24 hours) at 12/19/11 1042 Last data filed at 12/19/11 0300  Gross per 24 hour  Intake    730 ml  Output    150 ml  Net    580 ml   Filed Weights   12/15/11 2130 12/16/11 0116  Weight: 77.111 kg (170 lb) 69.446 kg (153 lb 1.6 oz)    Exam: Gen : Awake Alert, Oriented X 3, lying in bed  Head : [place].AT Neck : Supple, No JVD, No cervical lymphadenopathy appriciated.  Lungs : GBAE CTAB  CVS : RRR,No Gallops,Rubs or new Murmurs Abd : +ve B.Sounds, Abd Soft, Non tender  Skin : MP rash over the back and lower chest and upper abdomen noted. Resolving Ext : R BKA noted.   Data Reviewed: Basic Metabolic Panel:  Lab 12/18/11 8119 12/17/11 0301 12/16/11 1158 12/16/11 0620 12/15/11 1728  NA 134* 136 140 138 139  K 4.4 4.5 4.5 4.4 5.2*  CL 104 105 -- 106 105  CO2 27 26 -- 26 24  GLUCOSE 57* 143* 86 95 182*  BUN 18 22 -- 24* 27*  CREATININE 1.00 1.10 -- 1.19 1.27  CALCIUM 8.0* 8.4 -- 8.8 9.6  MG -- -- -- -- --  PHOS -- -- -- -- --   Liver Function Tests:  Lab 12/15/11 1728  AST 25  ALT 16  ALKPHOS 172*  BILITOT 0.6  PROT 6.4  ALBUMIN 2.0*   No results found for  this basename: LIPASE:5,AMYLASE:5 in the last 168 hours No results found for this basename: AMMONIA:5 in the last 168 hours CBC:  Lab 12/17/11 1312 12/17/11 0301 12/16/11 2047 12/16/11 1421 12/16/11 1158 12/16/11 0505 12/15/11 1728  WBC 7.7 7.0 -- -- -- 6.3 10.8*  NEUTROABS -- -- -- -- -- -- 9.3*  HGB 9.2* 9.3* 9.3* 10.0* 9.2* -- --  HCT 27.7* 28.3* 27.7* 30.1* 27.0* -- --  MCV 82.7 82.0 -- -- -- 80.1 81.3  PLT 132* 134* -- -- -- 154 221   Cardiac Enzymes: No results found for this basename: CKTOTAL:5,CKMB:5,CKMBINDEX:5,TROPONINI:5 in the last 168 hours BNP (last 3 results) No results found for this basename: PROBNP:3 in the last 8760 hours CBG:  Lab 12/19/11 0750 12/18/11 2133 12/18/11 1724 12/18/11 1217 12/18/11 0810  GLUCAP 83 160* 228* 118* 71    Recent Results (from the past 240 hour(s))  SURGICAL PCR  SCREEN     Status: Abnormal   Collection Time   12/16/11  4:13 AM      Component Value Range Status Comment   MRSA, PCR POSITIVE (*) NEGATIVE Final    Staphylococcus aureus POSITIVE (*) NEGATIVE Final      Studies: No results found.  Scheduled Meds:   . aspirin  81 mg Oral Daily  . atorvastatin  10 mg Oral Daily  . Chlorhexidine Gluconate Cloth  6 each Topical Q0600  . digoxin  125 mcg Oral Daily  . feeding supplement  237 mL Oral TID BM  . ferrous sulfate  325 mg Oral BID  . furosemide  20 mg Oral Daily  . glipiZIDE  10 mg Oral Daily  . heparin subcutaneous  5,000 Units Subcutaneous Q8H  . hydrocortisone cream   Topical BID  . insulin aspart  0-9 Units Subcutaneous TID WC  . multivitamin with minerals  1 tablet Oral Daily  . mupirocin ointment  1 application Nasal BID  . pantoprazole  40 mg Oral BID AC  . ranolazine  500 mg Oral BID  . terazosin  10 mg Oral QHS  . vitamin C  500 mg Oral Daily  . vitamin E  400 Units Oral Daily  . zinc sulfate  220 mg Oral Daily  . DISCONTD: ceFEPime (MAXIPIME) IV  1 g Intravenous Q12H  . DISCONTD: vancomycin  750 mg Intravenous Q12H   Continuous Infusions:   Principal Problem:  *Diabetic foot ulcer with osteomyelitis Active Problems:  DIABETES MELLITUS, TYPE II  HYPERLIPIDEMIA  HYPERTENSION  Anemia    Time spent: 30 min    Jamie Romero V.  [facility] Pager [phone]. If 8PM-8AM, please contact night-coverage at [email or web address], password [facility] 12/19/2011, 10:42 AM  LOS: 4 days

## 2011-12-20 ENCOUNTER — Inpatient Hospital Stay (HOSPITAL_COMMUNITY)
Admission: RE | Admit: 2011-12-20 | Discharge: 2012-01-03 | DRG: 945 | Disposition: A | Discharge status: Home Health | Payer: Medicare Other | Source: Ambulatory Visit | Attending: Physical Medicine & Rehabilitation | Admitting: Physical Medicine & Rehabilitation

## 2011-12-20 DIAGNOSIS — Z5189 Encounter for other specified aftercare: Principal | ICD-10-CM

## 2011-12-20 DIAGNOSIS — E1142 Type 2 diabetes mellitus with diabetic polyneuropathy: Secondary | ICD-10-CM

## 2011-12-20 DIAGNOSIS — Z87891 Personal history of nicotine dependence: Secondary | ICD-10-CM

## 2011-12-20 DIAGNOSIS — E871 Hypo-osmolality and hyponatremia: Secondary | ICD-10-CM | POA: Diagnosis present

## 2011-12-20 DIAGNOSIS — E1159 Type 2 diabetes mellitus with other circulatory complications: Secondary | ICD-10-CM | POA: Diagnosis present

## 2011-12-20 DIAGNOSIS — L98499 Non-pressure chronic ulcer of skin of other sites with unspecified severity: Secondary | ICD-10-CM

## 2011-12-20 DIAGNOSIS — S88119A Complete traumatic amputation at level between knee and ankle, unspecified lower leg, initial encounter: Secondary | ICD-10-CM

## 2011-12-20 DIAGNOSIS — E785 Hyperlipidemia, unspecified: Secondary | ICD-10-CM

## 2011-12-20 DIAGNOSIS — Z954 Presence of other heart-valve replacement: Secondary | ICD-10-CM

## 2011-12-20 DIAGNOSIS — I209 Angina pectoris, unspecified: Secondary | ICD-10-CM | POA: Diagnosis present

## 2011-12-20 DIAGNOSIS — I739 Peripheral vascular disease, unspecified: Secondary | ICD-10-CM | POA: Diagnosis present

## 2011-12-20 DIAGNOSIS — I10 Essential (primary) hypertension: Secondary | ICD-10-CM | POA: Diagnosis present

## 2011-12-20 DIAGNOSIS — D509 Iron deficiency anemia, unspecified: Secondary | ICD-10-CM | POA: Diagnosis present

## 2011-12-20 DIAGNOSIS — L97509 Non-pressure chronic ulcer of other part of unspecified foot with unspecified severity: Secondary | ICD-10-CM | POA: Diagnosis present

## 2011-12-20 DIAGNOSIS — L270 Generalized skin eruption due to drugs and medicaments taken internally: Secondary | ICD-10-CM | POA: Diagnosis present

## 2011-12-20 DIAGNOSIS — D62 Acute posthemorrhagic anemia: Secondary | ICD-10-CM

## 2011-12-20 DIAGNOSIS — E1165 Type 2 diabetes mellitus with hyperglycemia: Secondary | ICD-10-CM

## 2011-12-20 DIAGNOSIS — E119 Type 2 diabetes mellitus without complications: Secondary | ICD-10-CM | POA: Diagnosis present

## 2011-12-20 DIAGNOSIS — Z9889 Other specified postprocedural states: Secondary | ICD-10-CM

## 2011-12-20 DIAGNOSIS — K219 Gastro-esophageal reflux disease without esophagitis: Secondary | ICD-10-CM | POA: Diagnosis present

## 2011-12-20 LAB — GLUCOSE, CAPILLARY
Glucose-Capillary: 80 mg/dL (ref 70–99)
Glucose-Capillary: 73 mg/dL (ref 70–99)
Glucose-Capillary: 127 mg/dL — ABNORMAL HIGH (ref 70–99)
Glucose-Capillary: 126 mg/dL — ABNORMAL HIGH (ref 70–99)

## 2011-12-20 MED ORDER — ENOXAPARIN SODIUM 40 MG/0.4ML ~~LOC~~ SOLN
40.0000 mg | SUBCUTANEOUS | Status: DC
  Administered 2011-12-20 – 2012-01-02 (×14): 40 mg via SUBCUTANEOUS
  Filled 2011-12-20 (×16): qty 0.4

## 2011-12-20 MED ORDER — ALUM & MAG HYDROXIDE-SIMETH 200-200-20 MG/5ML PO SUSP: 30.0000 mL | ORAL | Status: DC | PRN

## 2011-12-20 MED ORDER — HYDROCODONE-ACETAMINOPHEN 5-325 MG PO TABS
1.0000 | ORAL_TABLET | ORAL | Status: DC | PRN
  Filled 2011-12-20: qty 2

## 2011-12-20 MED ORDER — TRAZODONE HCL 50 MG PO TABS
25.0000 mg | ORAL_TABLET | Freq: Every evening | ORAL | Status: DC | PRN
  Administered 2011-12-20: 50 mg via ORAL
  Filled 2011-12-20: qty 1

## 2011-12-20 MED ORDER — HYDROCORTISONE 0.5 % EX CREA
TOPICAL_CREAM | Freq: Two times a day (BID) | CUTANEOUS | Status: AC
  Administered 2011-12-20 – 2011-12-22 (×5): via TOPICAL
  Filled 2011-12-20 (×2): qty 28.35

## 2011-12-20 MED ORDER — PANTOPRAZOLE SODIUM 40 MG PO TBEC: 40.0000 mg | DELAYED_RELEASE_TABLET | Freq: Two times a day (BID) | ORAL | Status: DC

## 2011-12-20 MED ORDER — DIGOXIN 125 MCG PO TABS
125.0000 ug | ORAL_TABLET | Freq: Every day | ORAL | Status: DC
  Administered 2011-12-21 – 2012-01-03 (×14): 125 ug via ORAL
  Filled 2011-12-20 (×15): qty 1

## 2011-12-20 MED ORDER — FERROUS SULFATE 325 (65 FE) MG PO TABS
325.0000 mg | ORAL_TABLET | Freq: Two times a day (BID) | ORAL | Status: DC
  Administered 2011-12-20 – 2012-01-03 (×28): 325 mg via ORAL
  Filled 2011-12-20 (×30): qty 1

## 2011-12-20 MED ORDER — PROCHLORPERAZINE 25 MG RE SUPP
12.5000 mg | Freq: Four times a day (QID) | RECTAL | Status: DC | PRN
  Filled 2011-12-20: qty 1

## 2011-12-20 MED ORDER — HYDROCODONE-ACETAMINOPHEN 10-325 MG PO TABS: 1.0000 | ORAL_TABLET | Freq: Three times a day (TID) | ORAL | Status: DC | PRN

## 2011-12-20 MED ORDER — DOCUSATE SODIUM 100 MG PO CAPS
200.0000 mg | ORAL_CAPSULE | Freq: Every day | ORAL | Status: DC
  Administered 2011-12-20 – 2012-01-02 (×14): 200 mg via ORAL
  Filled 2011-12-20 (×15): qty 2

## 2011-12-20 MED ORDER — ASPIRIN 81 MG PO CHEW
81.0000 mg | CHEWABLE_TABLET | Freq: Every day | ORAL | Status: DC
  Administered 2011-12-21 – 2012-01-03 (×14): 81 mg via ORAL
  Filled 2011-12-20 (×13): qty 1

## 2011-12-20 MED ORDER — GLIPIZIDE 5 MG PO TABS
5.0000 mg | ORAL_TABLET | Freq: Every day | ORAL | Status: DC
  Administered 2011-12-22 – 2012-01-03 (×13): 5 mg via ORAL
  Filled 2011-12-20 (×15): qty 1

## 2011-12-20 MED ORDER — PROCHLORPERAZINE MALEATE 5 MG PO TABS
5.0000 mg | ORAL_TABLET | Freq: Four times a day (QID) | ORAL | Status: DC | PRN
  Filled 2011-12-20: qty 2

## 2011-12-20 MED ORDER — PANTOPRAZOLE SODIUM 40 MG PO TBEC
40.0000 mg | DELAYED_RELEASE_TABLET | Freq: Two times a day (BID) | ORAL | Status: DC
  Administered 2011-12-20 – 2012-01-03 (×27): 40 mg via ORAL
  Filled 2011-12-20 (×27): qty 1

## 2011-12-20 MED ORDER — DIPHENHYDRAMINE HCL 12.5 MG/5ML PO ELIX
12.5000 mg | ORAL_SOLUTION | Freq: Three times a day (TID) | ORAL | Status: AC
  Administered 2011-12-20 – 2011-12-22 (×6): 12.5 mg via ORAL
  Filled 2011-12-20 (×6): qty 5

## 2011-12-20 MED ORDER — ADULT MULTIVITAMIN W/MINERALS CH
1.0000 | ORAL_TABLET | Freq: Every day | ORAL | Status: DC
  Administered 2011-12-21 – 2012-01-03 (×14): 1 via ORAL
  Filled 2011-12-20 (×15): qty 1

## 2011-12-20 MED ORDER — VITAMIN C 500 MG PO TABS
500.0000 mg | ORAL_TABLET | Freq: Every day | ORAL | Status: DC
  Administered 2011-12-21 – 2012-01-03 (×14): 500 mg via ORAL
  Filled 2011-12-20 (×15): qty 1

## 2011-12-20 MED ORDER — GLUCERNA SHAKE PO LIQD
237.0000 mL | Freq: Three times a day (TID) | ORAL | Status: DC
  Administered 2011-12-20 – 2011-12-21 (×2): 237 mL via ORAL

## 2011-12-20 MED ORDER — GLUCERNA SHAKE PO LIQD: 237.0000 mL | Freq: Three times a day (TID) | ORAL | Status: DC

## 2011-12-20 MED ORDER — PROCHLORPERAZINE EDISYLATE 5 MG/ML IJ SOLN
5.0000 mg | Freq: Four times a day (QID) | INTRAMUSCULAR | Status: DC | PRN
  Filled 2011-12-20: qty 2

## 2011-12-20 MED ORDER — METHOCARBAMOL 500 MG PO TABS
500.0000 mg | ORAL_TABLET | Freq: Four times a day (QID) | ORAL | Status: DC | PRN
  Administered 2012-01-01: 500 mg via ORAL
  Filled 2011-12-20 (×2): qty 1

## 2011-12-20 MED ORDER — POLYETHYLENE GLYCOL 3350 17 G PO PACK
17.0000 g | PACK | Freq: Every day | ORAL | Status: DC | PRN
  Filled 2011-12-20: qty 1

## 2011-12-20 MED ORDER — DIPHENHYDRAMINE HCL 12.5 MG/5ML PO ELIX
12.5000 mg | ORAL_SOLUTION | Freq: Four times a day (QID) | ORAL | Status: DC | PRN
  Filled 2011-12-20 (×2): qty 10

## 2011-12-20 MED ORDER — FUROSEMIDE 20 MG PO TABS
20.0000 mg | ORAL_TABLET | Freq: Every day | ORAL | Status: DC
  Administered 2011-12-21 – 2012-01-03 (×14): 20 mg via ORAL
  Filled 2011-12-20 (×15): qty 1

## 2011-12-20 MED ORDER — FLEET ENEMA 7-19 GM/118ML RE ENEM: 1.0000 | ENEMA | Freq: Once | RECTAL | Status: AC | PRN

## 2011-12-20 MED ORDER — ACETAMINOPHEN 325 MG PO TABS
325.0000 mg | ORAL_TABLET | ORAL | Status: DC | PRN
  Administered 2011-12-20 – 2012-01-01 (×5): 650 mg via ORAL
  Filled 2011-12-20 (×5): qty 2

## 2011-12-20 MED ORDER — BISACODYL 10 MG RE SUPP
10.0000 mg | Freq: Every day | RECTAL | Status: DC | PRN
  Filled 2011-12-20: qty 1

## 2011-12-20 MED ORDER — ZINC SULFATE 220 (50 ZN) MG PO CAPS
220.0000 mg | ORAL_CAPSULE | Freq: Every day | ORAL | Status: DC
  Administered 2011-12-21 – 2012-01-03 (×14): 220 mg via ORAL
  Filled 2011-12-20 (×19): qty 1

## 2011-12-20 MED ORDER — ATORVASTATIN CALCIUM 10 MG PO TABS
10.0000 mg | ORAL_TABLET | Freq: Every day | ORAL | Status: DC
  Administered 2011-12-21 – 2012-01-03 (×14): 10 mg via ORAL
  Filled 2011-12-20 (×18): qty 1

## 2011-12-20 MED ORDER — RANOLAZINE ER 500 MG PO TB12
500.0000 mg | ORAL_TABLET | Freq: Two times a day (BID) | ORAL | Status: DC
  Administered 2011-12-20 – 2012-01-03 (×28): 500 mg via ORAL
  Filled 2011-12-20 (×34): qty 1

## 2011-12-20 MED ORDER — TERAZOSIN HCL 5 MG PO CAPS
10.0000 mg | ORAL_CAPSULE | Freq: Every day | ORAL | Status: DC
  Administered 2011-12-20 – 2011-12-21 (×2): 10 mg via ORAL
  Filled 2011-12-20 (×3): qty 2

## 2011-12-20 MED ORDER — HYDROCORTISONE 0.5 % EX CREA: TOPICAL_CREAM | Freq: Two times a day (BID) | CUTANEOUS | Status: AC

## 2011-12-20 MED ORDER — GUAIFENESIN-DM 100-10 MG/5ML PO SYRP: 5.0000 mL | ORAL_SOLUTION | Freq: Four times a day (QID) | ORAL | Status: DC | PRN

## 2011-12-20 MED ORDER — VITAMIN E 180 MG (400 UNIT) PO CAPS
400.0000 [IU] | ORAL_CAPSULE | Freq: Every day | ORAL | Status: DC
  Administered 2011-12-21 – 2012-01-03 (×14): 400 [IU] via ORAL
  Filled 2011-12-20 (×15): qty 1

## 2011-12-20 MED ORDER — INSULIN ASPART 100 UNIT/ML ~~LOC~~ SOLN
0.0000 [IU] | Freq: Three times a day (TID) | SUBCUTANEOUS | Status: DC
  Administered 2011-12-20: 1 [IU] via SUBCUTANEOUS
  Administered 2011-12-21 – 2011-12-25 (×7): 2 [IU] via SUBCUTANEOUS
  Administered 2011-12-25: 3 [IU] via SUBCUTANEOUS
  Administered 2011-12-26: 2 [IU] via SUBCUTANEOUS
  Administered 2011-12-26: 1 [IU] via SUBCUTANEOUS
  Administered 2011-12-27: 2 [IU] via SUBCUTANEOUS
  Administered 2011-12-27 – 2011-12-28 (×2): 1 [IU] via SUBCUTANEOUS
  Administered 2011-12-28: 3 [IU] via SUBCUTANEOUS
  Administered 2011-12-29 – 2012-01-03 (×5): 1 [IU] via SUBCUTANEOUS

## 2011-12-20 NOTE — H&P (Signed)
Physical Medicine and Rehabilitation Admission H&P  Chief Complaint   Patient presents with   .  R-BKA   :  HPI: Jamie Romero is a 78 y.o. 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 He was noted to be anemic with Hgb @ 6.8 and transfused with 3 units PRBC. He was started on IV vancomycin and underwent R-BKA on 12/16/11 by Dr. Victorino Dike. Post op developed rash--?antibiotic related as was on multiple antibiotics (cefipime, vancomycin). Therapies ongoing and rehab recommended for progression.  Review of Systems  Eyes: Negative for blurred vision and double vision.  Respiratory: Negative for shortness of breath.  Cardiovascular: Negative for chest pain and palpitations.  Gastrointestinal: Negative for nausea and constipation.  Poor appetite.  Genitourinary: Negative for urgency.  Neurological: Positive for sensory change (left foot) and weakness. Negative for headaches.   Past Medical History   Diagnosis  Date   .  Valvular heart disease    .  GERD (gastroesophageal reflux disease)    .  Hypertension    .  Hyperlipidemia    .  Complication of anesthesia      "had problem extubating him after heart surgery; don't recall why" (12/15/2011)   .  Peripheral vascular disease    .  Anginal pain      "used to" (12/15/2011)   .  Pneumonia  11/2011     "2nd bout w/it" (12/15/2011)   .  Type II diabetes mellitus    .  Iron (Fe) deficiency anemia    .  History of blood transfusion    .  Arthritis      "joints" (12/15/2011)   .  Diabetic foot ulcer      right foot (12/15/2011)    Past Surgical History   Procedure  Date   .  Aortic valve replacement  ?1990's     porcine   .  Trauma with skull fracture  ?late 1950's     "has plate in his head"   .  Cardiac valve replacement    .  Knee arthroscopy      "took cartilage out; both knees"   .  Penectomy   2013     "it had grown over; had to reopen"   .  Amputation  12/16/2011     Procedure: AMPUTATION BELOW KNEE; Surgeon: Toni Arthurs, MD; Location: [facility] OR; Service: Orthopedics; Laterality: Right;    Family History   Problem  Relation  Age of Onset   .  Diabetes  Sister    .  Cancer  Sister    .  Cancer  Brother     Social History: Lives with family. Independent--was able to walk short distances with walker until recently? Used WC in home occasionally. Needed some assist with ADLs. He reports that he has quit smoking. His smoking use included Cigars. His smokeless tobacco use includes Chew. He reports that he does not drink alcohol or use illicit drugs  Allergies: No Known Allergies  Scheduled Meds:  .  aspirin  81 mg  Oral  Daily   .  atorvastatin  10 mg  Oral  Daily   .  Chlorhexidine Gluconate Cloth  6 each  Topical  Q0600   .  digoxin  125 mcg  Oral  Daily   .  feeding supplement  237 mL  Oral  TID BM   .  ferrous sulfate  325 mg  Oral  BID   .  furosemide  20 mg  Oral  Daily   .  glipiZIDE  10 mg  Oral  Daily   .  heparin subcutaneous  5,000 Units  Subcutaneous  Q8H   .  hydrocortisone cream   Topical  BID   .  insulin aspart  0-9 Units  Subcutaneous  TID WC   .  multivitamin with minerals  1 tablet  Oral  Daily   .  mupirocin ointment  1 application  Nasal  BID   .  pantoprazole  40 mg  Oral  BID AC   .  ranolazine  500 mg  Oral  BID   .  terazosin  10 mg  Oral  QHS   .  vitamin C  500 mg  Oral  Daily   .  vitamin E  400 Units  Oral  Daily   .  zinc sulfate  220 mg  Oral  Daily    Medications Prior to Admission   Medication  Sig  Dispense  Refill   .  Ascorbic Acid (VITAMIN C) 500 MG tablet  Take 500 mg by mouth daily.     Marland Kitchen  aspirin 81 MG chewable tablet  Chew 81 mg by mouth daily.     Marland Kitchen  atorvastatin (LIPITOR) 10 MG tablet  Take 1 tablet (10 mg total) by mouth daily.  30 tablet  5   .  baclofen (LIORESAL) 10 MG tablet  TAKE ONE TABLET BY MOUTH AT BEDTIME  45 each  2   .   digoxin (LANOXIN) 0.125 MG tablet  Take 1 tablet (125 mcg total) by mouth daily.  30 tablet  5   .  ferrous sulfate 325 (65 FE) MG tablet  Take 1 tablet (325 mg total) by mouth 2 (two) times daily.  60 tablet  5   .  furosemide (LASIX) 20 MG tablet  Take 1 tablet (20 mg total) by mouth daily.  30 tablet  5   .  glipiZIDE (GLUCOTROL) 10 MG tablet  Take 1 tablet (10 mg total) by mouth daily.  30 tablet  5   .  Multiple Vitamin (MULTIVITAMIN WITH MINERALS) TABS  Take 1 tablet by mouth daily.     .  ranolazine (RANEXA) 500 MG 12 hr tablet  Take 1 tablet (500 mg total) by mouth 2 (two) times daily.  60 tablet  5   .  terazosin (HYTRIN) 10 MG capsule  Take 1 capsule (10 mg total) by mouth at bedtime.  30 capsule  5   .  vitamin E 400 UNIT capsule  Take 400 Units by mouth daily.     Marland Kitchen  zinc sulfate (ZINCATE) 220 MG capsule  Take 1 capsule (220 mg total) by mouth daily.  30 capsule  5   .  DISCONTD: doxycycline (VIBRA-TABS) 100 MG tablet  Take 100 mg by mouth 2 (two) times daily.     Marland Kitchen  DISCONTD: HYDROcodone-acetaminophen (NORCO) 10-325 MG per tablet  Take 1 tablet by mouth every 8 (eight) hours as needed for pain.  30 tablet  0   .  DISCONTD: lisinopril (PRINIVIL,ZESTRIL) 10 MG tablet  Take 10 mg by mouth daily.     Marland Kitchen  DISCONTD: ranitidine (ZANTAC) 150 MG tablet  TAKE ONE TABLET BY MOUTH EVERY DAY  30 tablet  5    Home:  Home Living  Lives With: Son  Available Help at Discharge: Family;Available 24 hours/day  Type of Home: House  Home Access: Ramped entrance  Home Layout: One level  Bathroom Shower/Tub: Tub/shower unit;Curtain  Firefighter: Standard  Bathroom Accessibility: Yes  How Accessible: Accessible via walker  Home Adaptive Equipment: Wheelchair - manual;Grab bars in shower;Shower chair with back  Functional History:  Prior Function  Bath: Minimal  Able to Take Stairs?: No  Driving: Yes  Vocation: Retired  Comments: Reports nurse comes in 2 x weekly to assist with showering in  tub. Pt sponges bathing for past week.  Functional Status:  Mobility:  Bed Mobility  Bed Mobility: Supine to Sit;Sitting - Scoot to Edge of Bed  Supine to Sit: 4: Min guard  Sitting - Scoot to [place] of Bed: 4: Min guard  Sit to Supine: 4: Min assist;HOB flat  Transfers  Transfers: Sit to Stand;Stand to Sit  Sit to Stand: 1: +2 Total assist;From chair/3-in-1;With upper extremity assist  Sit to Stand: Patient Percentage: 50%  Stand to Sit: 1: +2 Total assist;To bed;To chair/3-in-1;With upper extremity assist;With armrests  Stand to Sit: Patient Percentage: 50%  Stand Pivot Transfers: 1: +2 Total assist  Stand Pivot Transfers: Patient Percentage: 30%  Transfer via Lift Equipment: Stedy  Ambulation/Gait  Ambulation/Gait Assistance: Not tested (comment)   ADL:  ADL  Lower Body Bathing: Maximal assistance;Simulated  Where Assessed - Lower Body Bathing: Unsupported sitting  Lower Body Dressing: Performed;Maximal assistance (left sock)  Where Assessed - Lower Body Dressing: Unsupported sitting  Toilet Transfer: Simulated;+2 Total assistance  Toilet Transfer Method: Sit to stand  Toilet Transfer Equipment: (recliner to stedy, stedy to bed)  Equipment Used: Knee Immobilizer;Gait belt (stedy lift)  Transfers/Ambulation Related to ADLs: Attempted SPT x1 but pt unable to safely pivot on LLE using RW. Pt performed sit<>Stand x2 with +2 total pt 40%. Used stedy to transfer from chair to bed. Pt would benefit from sara plus lift in future when fatigued during transfer.  ADL Comments: Pt fatigued from sitting up in chair for several hours. Used stedy for transfer.  Cognition:  Cognition  Arousal/Alertness: Awake/alert  Orientation Level: Oriented X4  Cognition  Overall Cognitive Status: Impaired  Area of Impairment: Following commands  Arousal/Alertness: Awake/alert  Orientation Level: Appears intact for tasks assessed  Behavior During Session: [facility] for tasks performed  Following Commands:  Follows multi-step commands with increased time  Cognition - Other Comments: Required increased processing time during questions/commands.  Blood pressure 111/56, pulse 59, temperature 97.8 F (36.6 C), temperature source Oral, resp. rate 17, height 5\' 6"  (1.676 m), weight 69.446 kg (153 lb 1.6 oz), SpO2 96.00%.  Physical Exam  Nursing note and vitals reviewed.  Constitutional: He is oriented to person, place, and time.  Pleasant thin elderly male, NAD.  HENT:  Head: Normocephalic and atraumatic.  edentulous  Eyes: Pupils are equal, round, and reactive to light.  Neck: Normal range of motion.  Cardiovascular: Normal rate and regular rhythm.  Pulmonary/Chest: Effort normal and breath sounds normal.  Abdominal: Soft. Bowel sounds are normal. He exhibits no distension. There is no tenderness.  Musculoskeletal: He exhibits edema (2+ left pedal.).  R_BKA with compressive dressing in place.  Neurological: He is alert and oriented to person, place, and time.  Decreased sensation left foot/leg in a stocking glove distribution.. Left foot drop and weakness with eversion as well. LLE is grossly 4/5 except for affected areas which are grossly 1/5. Skin: Skin is warm and dry. Rash (diffuse  macular rash on chest, abdomen and back. BLE has some scabbing with rash and minimal rash with ecchymotic areas on bilateral forearms. ) noted.   CBC     Component  Value  Date/Time    WBC  7.7  12/17/2011 1312    RBC  3.35*  12/17/2011 1312    HGB  9.2*  12/17/2011 1312    HCT  27.7*  12/17/2011 1312    PLT  132*  12/17/2011 1312    MCV  82.7  12/17/2011 1312    MCH  27.5  12/17/2011 1312    MCHC  33.2  12/17/2011 1312    RDW  15.8*  12/17/2011 1312    LYMPHSABS  0.6*  12/15/2011 1728    MONOABS  0.8  12/15/2011 1728    EOSABS  0.0  12/15/2011 1728    BASOSABS  0.0  12/15/2011 1728   BMET    Component  Value  Date/Time    NA  134*  12/18/2011 0800    K  4.4  12/18/2011 0800    CL  104  12/18/2011 0800    CO2  27   12/18/2011 0800    GLUCOSE  57*  12/18/2011 0800    GLUCOSE  115*  03/04/2006 1152    BUN  18  12/18/2011 0800    CREATININE  1.00  12/18/2011 0800    CALCIUM  8.0*  12/18/2011 0800    GFRNONAA  65*  12/18/2011 0800    GFRAA  76*  12/18/2011 0800     12/19/11 5:31 PM   Component  Value  Range  Comment    Glucose-Capillary  79  70 - 99 mg/dL     Comment 1  Notify RN     GLUCOSE, CAPILLARY Status: Normal    Collection Time    12/19/11 10:09 PM   Component  Value  Range  Comment    Glucose-Capillary  80  70 - 99 mg/dL     Comment 1  Notify RN      Comment 2  Documented in Chart     GLUCOSE, CAPILLARY Status: Abnormal    Collection Time    12/20/11 7:52 AM   Component  Value  Range  Comment    Glucose-Capillary  60 (*)  70 - 99 mg/dL     Comment 1  Notify RN     GLUCOSE, CAPILLARY Status: Normal    Collection Time    12/20/11 8:23 AM   Component  Value  Range  Comment    Glucose-Capillary  90  70 - 99 mg/dL    No results found.  Post Admission Physician Evaluation:  1. Functional deficits secondary to right BKA. 2. Patient is admitted to receive collaborative, interdisciplinary care between the physiatrist, rehab nursing staff, and therapy team. 3. Patient's level of medical complexity and substantial therapy needs in context of that medical necessity cannot be provided at a lesser intensity of care such as a SNF. 4. Patient has experienced substantial functional loss from his/her baseline which was documented above under the "Functional History" and "Functional Status" headings. Judging by the patient's diagnosis, physical exam, and functional history, the patient has potential for functional progress which will result in measurable gains while on inpatient rehab. These gains will be of substantial and practical use upon discharge in facilitating mobility and self-care at the household level. 5. Physiatrist will provide 24 hour management of medical needs as well as oversight of the therapy  plan/treatment and provide guidance as appropriate regarding the interaction of the two. 6. 24 hour rehab nursing will assist with bladder management, bowel management, safety, skin/wound care, disease management, medication administration, pain management and patient education and help integrate therapy concepts, techniques,education, etc. 7. PT will assess and treat for: fxnl mobility, pre-pros ed, safety, pain mgt. Goals are: supervision to mod I. 8. OT will assess and treat for: upper ext strength, ROM, fxnl mobility, safety, pain mgt, ADL's,. Goals are: supervision to mod I. 9. SLP will assess and treat for: n/a. Goals are: n/a. 10. Case Management and Social Worker will assess and treat for psychological issues and discharge planning. 11. Team conference will be held weekly to assess progress toward goals and to determine barriers to discharge. 12. Patient will receive at least 3 hours of therapy per day at least 5 days per week. 13. ELOS: 10 days Prognosis: excellent Medical Problem List and Plan:  1. DVT Prophylaxis/Anticoagulation: Pharmaceutical: Lovenox  2. Pain Management: Continue hydrocodone prn  3. Mood: team will provide egosupport as appropriate 4. Neuropsych: This patient is capable of making decisions on his/her own behalf.  5. DM type 2: Continue AC/HS cbg checks to keep BS controlled and help wound healing. Will add HS snack. Decrease glucotrol to 5 mg daily to avoid low BS. Will continue supplements.  6. HTN: Monitor with bid checks. Continue terazosin, ranexa, lasix, and digoxin. Lisinopril discontinued due to low BP.  7. GERD: On protonix.  8. Iron deficiency anemia: continue iron supplements. Recheck in am.  9. Hyponatremia: recheck in am.  10. H/o angina: continue ranexa.  11. Dermatitis: ?contact v/s reaction to mediation. Will monitor. Continue hydrocortisone cream bid. Vancomycin discontinued yesterday and doxycycline discontinued today  Ivory Broad, MD

## 2011-12-20 NOTE — PMR Pre-admission (Signed)
PMR Admission Coordinator Pre-Admission Assessment  Patient: Jamie Romero is an 78 y.o., male MRN: [id number] DOB: October 06, 1922 Height: 5\' 6"  (167.6 cm) Weight: 69.446 kg (153 lb 1.6 oz)  Insurance Information HMO:     PPO:      PCP:      IPA:      80/20: ues     OTHER: no HMO PRIMARY: Medicare a and b      Policy#: [id number]      Subscriber: pt CM Name:       Phone#:      Fax#:  Pre-Cert#:       Employer:  Benefits:  Phone #: vision share     Name: 12/20/11 Eff. Date: 04/16/87     Deduct: $1184      Out of Pocket Max: none      Life Max: none CIR: 100%      SNF: 12 full days left 80 co-snf days LBD 11/17/11 recent snf at [facility] snf in [place] Outpatient: 80%     Co-Pay: 20% Home Health: 100%      Co-Pay: none DME: 80%     Co-Pay: 20% Providers: pt choice  SECONDARY: AFLAC      Policy#: [id number]      Subscriber: pt No auth required  Medicaid Application Date:       Case Manager:   Emergency Contact Information Contact Information    Name Relation Home Work Mobile   [place] Son   [phone]   [facility] Relative [phone]     Teodora Medici Daughter   [phone]     Current Medical History  Patient Admitting Diagnosis:Right BKA, MRSA   History of Present Illness:Jamie Romero is a 78 y.o. 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 He was started on IV vancomycin and underwent R-BKA on 12/16/11 by Dr. Victorino Dike.   Possible reaction of cefepime with rash : He has no h/o PCN allergy or any other abx allergy. This is likely rash from cefepime and it should be avoided in the future. Pt clinically stable.   Past Medical History  Past Medical History  Diagnosis Date  . Valvular heart disease   . GERD (gastroesophageal reflux disease)   . Hypertension   . Hyperlipidemia   . Complication of anesthesia     "had  problem extubating him after heart surgery; don't recall why" (12/15/2011)  . Peripheral vascular disease   . Anginal pain     "used to" (12/15/2011)  . Pneumonia 11/2011    "2nd bout w/it" (12/15/2011)  . Type II diabetes mellitus   . Iron (Fe) deficiency anemia   . History of blood transfusion   . Arthritis     "joints" (12/15/2011)  . Diabetic foot ulcer     right foot (12/15/2011)    Family History  family history includes Cancer in his brother and sister and Diabetes in his sister.  Prior Rehab/Hospitalizations: Recent SNF stay at [facility] in [place]. Family and pt prefer inpt rehab stay rather than SNF. Did little rehab per daughter at the SNF due to pain in is RLE at the time. 8 weeks in SNF per daughter in law   Current Medications  Current facility-administered medications:aspirin chewable tablet 81 mg, 81 mg, Oral, Daily, Hillary Bow, DO, 81 mg at 12/20/11 1005;  atorvastatin (LIPITOR) tablet 10 mg,  10 mg, Oral, Daily, Hillary Bow, DO, 10 mg at 12/20/11 1005;  Chlorhexidine Gluconate Cloth 2 % PADS 6 each, 6 each, Topical, Q0600, Toni Arthurs, MD, 6 each at 12/20/11 0603 digoxin (LANOXIN) tablet 125 mcg, 125 mcg, Oral, Daily, Hillary Bow, DO, 125 mcg at 12/20/11 1005;  feeding supplement (GLUCERNA SHAKE) liquid 237 mL, 237 mL, Oral, TID BM, Tarak V. Allena Katz, MD, 237 mL at 12/20/11 1006;  ferrous sulfate tablet 325 mg, 325 mg, Oral, BID, Hillary Bow, DO, 325 mg at 12/20/11 1005;  furosemide (LASIX) tablet 20 mg, 20 mg, Oral, Daily, Leroy Sea, MD, 20 mg at 12/20/11 1005 glipiZIDE (GLUCOTROL) tablet 10 mg, 10 mg, Oral, Daily, Leroy Sea, MD, 10 mg at 12/20/11 0748;  heparin injection 5,000 Units, 5,000 Units, Subcutaneous, Q8H, Leroy Sea, MD, 5,000 Units at 12/20/11 0557;  HYDROcodone-acetaminophen (NORCO/VICODIN) 5-325 MG per tablet 1 tablet, 1 tablet, Oral, Q4H PRN, Toni Arthurs, MD, 1 tablet at 12/17/11 2245;  hydrocortisone cream 0.5 %, , Topical,  BID, Tarak V. Allena Katz, MD HYDROmorphone (DILAUDID) injection 0.5 mg, 0.5 mg, Intravenous, Q8H PRN, Tarak V. Allena Katz, MD;  insulin aspart (novoLOG) injection 0-9 Units, 0-9 Units, Subcutaneous, TID WC, Leroy Sea, MD, 3 Units at 12/18/11 1751;  metoCLOPramide (REGLAN) injection 5-10 mg, 5-10 mg, Intravenous, Q8H PRN, Toni Arthurs, MD;  metoCLOPramide (REGLAN) tablet 5-10 mg, 5-10 mg, Oral, Q8H PRN, Toni Arthurs, MD multivitamin with minerals tablet 1 tablet, 1 tablet, Oral, Daily, Hillary Bow, DO, 1 tablet at 12/20/11 1005;  mupirocin ointment (BACTROBAN) 2 % 1 application, 1 application, Nasal, BID, Toni Arthurs, MD, 1 application at 12/19/11 2237;  ondansetron (ZOFRAN) injection 4 mg, 4 mg, Intravenous, Q6H PRN, Toni Arthurs, MD;  ondansetron [facility]) tablet 4 mg, 4 mg, Oral, Q6H PRN, Toni Arthurs, MD pantoprazole (PROTONIX) EC tablet 40 mg, 40 mg, Oral, BID AC, Crystal Stillinger [place], PHARMD, 40 mg at 12/20/11 8295;  ranolazine (RANEXA) 12 hr tablet 500 mg, 500 mg, Oral, BID, Hillary Bow, DO, 500 mg at 12/20/11 1005;  terazosin (HYTRIN) capsule 10 mg, 10 mg, Oral, QHS, Hillary Bow, DO, 10 mg at 12/19/11 2236;  vitamin C (ASCORBIC ACID) tablet 500 mg, 500 mg, Oral, Daily, Hillary Bow, DO, 500 mg at 12/20/11 1005 vitamin E capsule 400 Units, 400 Units, Oral, Daily, Hillary Bow, DO, 400 Units at 12/20/11 1005;  zinc sulfate capsule 220 mg, 220 mg, Oral, Daily, Jared M. 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 Functional decline since jan.2013  Home Assistive Devices / Equipment Home Assistive Devices/Equipment: Systems developer (specify type);CBG Meter;Grab bars in shower;Grab bars around toilet (walker w/4  wheels) Home Adaptive Equipment: Wheelchair - manual;Grab bars in shower;Shower chair with back  Prior Functional Level Prior Function Level of Independence: Needs assistance Needs Assistance: Bathing Bath: Minimal Able to Take Stairs?: No Driving: Yes Vocation: Retired Comments: Reports nurse comes in 2 x weekly to assist with showering in tub.  Pt sponges bathing for past week.  Current Functional Level Cognition  Arousal/Alertness: Awake/alert Overall Cognitive Status: Impaired Orientation Level: Oriented X4 Following Commands: Follows multi-step commands with increased time Cognition - Other Comments: Required increased processing time during questions/commands.      Extremity Assessment (includes Sensation/Coordination)  RUE ROM/Strength/Tone:  WFL for tasks assessed  RLE ROM/Strength/Tone: Unable to fully assess;Due to pain;Due to precautions;Deficits RLE ROM/Strength/Tone Deficits: unable to assess knee  MMT secondary to pain and recent incision. Hip WFL. Pt able to complete SLR independently    ADLs  Lower Body Bathing: Maximal assistance;Simulated Where Assessed - Lower Body Bathing: Unsupported sitting Lower Body Dressing: Performed;Maximal assistance (left sock) Where Assessed - Lower Body Dressing: Unsupported sitting Toilet Transfer: Simulated;+2 Total assistance Toilet Transfer: Patient Percentage: 40% Toilet Transfer Method: Sit to Barista:  (recliner to stedy, stedy to bed) Equipment Used: Knee Immobilizer;Gait belt (stedy lift) Transfers/Ambulation Related to ADLs: Attempted SPT x1 but pt unable to safely pivot on LLE using RW.  Pt performed sit<>Stand x2 with +2 total pt 40%.  Used stedy to transfer from chair to bed.  Pt would benefit from sara plus lift in future when fatigued during transfer. ADL Comments: Pt fatigued from sitting up in chair for several hours.  Used stedy for transfer.     Mobility  Bed Mobility: Supine to  Sit;Sitting - Scoot to Edge of Bed Supine to Sit: 4: Min guard Sitting - Scoot to [place] of Bed: 4: Min guard Sit to Supine: 4: Min assist;HOB flat    Transfers  Transfers: Sit to Stand;Stand to Sit Sit to Stand: 1: +2 Total assist;From chair/3-in-1;With upper extremity assist Sit to Stand: Patient Percentage: 50% Stand to Sit: 1: +2 Total assist;To bed;To chair/3-in-1;With upper extremity assist;With armrests Stand to Sit: Patient Percentage: 50% Stand Pivot Transfers: 1: +2 Total assist Stand Pivot Transfers: Patient Percentage: 30% Transfer via Lift Equipment: Stedy    Ambulation / Gait / Stairs / Psychologist, prison and probation services  Ambulation/Gait Ambulation/Gait Assistance: Not tested (comment)    Posture / Balance Static Standing Balance Static Standing - Balance Support: Bilateral upper extremity supported;During functional activity Static Standing - Level of Assistance: 4: Min assist Static Standing - Comment/# of Minutes: 2. Pt standing holding onto RW while completing hygiene, min assist for steadying     Previous Home Environment Living Arrangements: Children;Other relatives Lives With: Son Available Help at Discharge: Family;Available 24 hours/day Type of Home: House Home Layout: One level Home Access: Ramped entrance Bathroom Shower/Tub: Tub/shower unit;Curtain Bathroom Toilet: Standard Bathroom Accessibility: Yes How Accessible: Accessible via walker Home Care Services: Yes Type of Home Care Services: Homehealth aide Home Care Agency (if known): "don't know"  Discharge Living Setting Plans for Discharge Living Setting: Lives with (comment) (son and his wife) Type of Home at Discharge: House Discharge Home Layout: One level Discharge Home Access: Ramped entrance Discharge Bathroom Shower/Tub: Tub/shower unit Discharge Bathroom Toilet: Standard Discharge Bathroom Accessibility: Yes How Accessible: Accessible via walker Do you have any problems obtaining your medications?:  No  Social/Family/Support Systems Patient Roles: Parent;Other (Comment) (business owner) Contact Information: Darnelle Going, primary Anticipated Caregiver: Evalee Mutton, and daughter, Fannie Knee Anticipated Caregiver's Contact Information: see above Ability/Limitations of Caregiver: Jillyn Hidden is an amputee with a prosthesis. Fannie Knee is an Charity fundraiser works nights Caregiver Availability: 24/7 Discharge Plan Discussed with Primary Caregiver: Yes Is Caregiver In Agreement with Plan?: Yes Does Caregiver/Family have Issues with Lodging/Transportation while Pt is in Rehab?: No Patient likes to fish and would like to be able to do in future. [employer] laboratory technician and pier fishing at [employer].  Goals/Additional Needs Patient/Family Goal for Rehab: Mod I to supervision very short distances PT, Otherwise w/c mobility, Mod I to min assist OT Expected length of stay: ELOS 7 to 10 days Additional Information: Pt was  Mod I with cane and RW up until Jan 2013. Since then only walked short distances with RW in home and used manual w/c. In community he used Art gallery manager. Tends to [employer] outside Pt/Family Agrees to Admission and willing to participate: Yes Program Orientation Provided & Reviewed with Pt/Caregiver Including Roles  & Responsibilities: Yes  Patient Condition: Please see physician update to information in consult dated 12/17/2011.  Preadmission Screen Completed By:  Clois Dupes, 12/20/2011 10:06 AM ______________________________________________________________________   Discussed status with Dr. Riley Kill on 12/20/2011 at  1015 and received telephone approval for admission today.  Admission Coordinator:  Clois Dupes, time 2130 Date 12/20/2011.

## 2011-12-20 NOTE — Discharge Summary (Signed)
Physician Discharge Summary  Jamie Romero MRN: [id number] DOB/AGE: 76-Feb-1924 76 y.o.  PCP: Judie Petit, MD   Admit date: 12/15/2011 Discharge date: 12/20/2011  Discharge Diagnoses:  BKA on 12/16/2011 by Dr. Toni Arthurs    *Diabetic foot ulcer with osteomyelitis Active Problems:  DIABETES MELLITUS, TYPE II  HYPERLIPIDEMIA  HYPERTENSION  Anemia     Medication List     As of 12/20/2011 10:04 AM    STOP taking these medications         doxycycline 100 MG tablet   Commonly known as: VIBRA-TABS      lisinopril 10 MG tablet   Commonly known as: PRINIVIL,ZESTRIL      ranitidine 150 MG tablet   Commonly known as: ZANTAC      TAKE these medications         aspirin 81 MG chewable tablet   Chew 81 mg by mouth daily.      atorvastatin 10 MG tablet   Commonly known as: LIPITOR   Take 1 tablet (10 mg total) by mouth daily.      baclofen 10 MG tablet   Commonly known as: LIORESAL   TAKE ONE TABLET BY MOUTH AT BEDTIME      digoxin 0.125 MG tablet   Commonly known as: LANOXIN   Take 1 tablet (125 mcg total) by mouth daily.      feeding supplement Liqd   Take 237 mLs by mouth 3 (three) times daily between meals.      ferrous sulfate 325 (65 FE) MG tablet   Take 1 tablet (325 mg total) by mouth 2 (two) times daily.      furosemide 20 MG tablet   Commonly known as: LASIX   Take 1 tablet (20 mg total) by mouth daily.      glipiZIDE 10 MG tablet   Commonly known as: GLUCOTROL   Take 1 tablet (10 mg total) by mouth daily.      HYDROcodone-acetaminophen 10-325 MG per tablet   Commonly known as: NORCO   Take 1 tablet by mouth every 8 (eight) hours as needed for pain.      hydrocortisone cream 0.5 %   Apply topically 2 (two) times daily.      multivitamin with minerals Tabs   Take 1 tablet by mouth daily.      pantoprazole 40 MG tablet   Commonly known as: PROTONIX   Take 1 tablet (40 mg total) by mouth 2 (two) times daily before a meal.     ranolazine 500 MG 12 hr tablet   Commonly known as: RANEXA   Take 1 tablet (500 mg total) by mouth 2 (two) times daily.      terazosin 10 MG capsule   Commonly known as: HYTRIN   Take 1 capsule (10 mg total) by mouth at bedtime.      vitamin C 500 MG tablet   Commonly known as: ASCORBIC ACID   Take 500 mg by mouth daily.      vitamin E 400 UNIT capsule   Take 400 Units by mouth daily.      zinc sulfate 220 MG capsule   Take 1 capsule (220 mg total) by mouth daily.        Discharge Condition: Stable  Disposition: Inpatient rehabilitation   Consults:  #1 orthopedics  Significant Diagnostic Studies: Dg Chest 2 View  12/15/2011  *RADIOLOGY REPORT*  Clinical Data: 78 year old male with cough.  Preoperative respiratory examination for right foot surgery.  CHEST - 2 VIEW  Comparison: None  Findings: Cardiomegaly and evidence of cardiac valve replacement noted. Mild interstitial prominence is noted. There is no evidence of focal airspace disease, pulmonary edema, suspicious pulmonary nodule/mass, pleural effusion, or pneumothorax. No acute bony abnormalities are identified.  IMPRESSION: Interstitial prominence - probably chronic but of uncertain chronicity.  Cardiomegaly without other significant abnormalities identified.   Original Report Authenticated By: Rosendo Gros, M.D.    Dg Chest Port 1 View  12/16/2011  *RADIOLOGY REPORT*  Clinical Data: 78 year old male status post below-the-knee amputation.  Productive cough.  PORTABLE CHEST - 1 VIEW  Comparison: 12/15/2011.  Findings: Semi upright AP portable view 1252 hours.  Diffuse increased interstitial markings.  Confluent retrocardiac opacity. Probable small left pleural effusion.  Stable cardiac size and mediastinal contours.  Sequelae of CABG.  No pneumothorax.  IMPRESSION: Acute pulmonary edema suspected with small left pleural effusion and retrocardiac atelectasis.   Original Report Authenticated By: Harley Hallmark, M.D.       Microbiology: Recent Results (from the past 240 hour(s))  SURGICAL PCR SCREEN     Status: Abnormal   Collection Time   12/16/11  4:13 AM      Component Value Range Status Comment   MRSA, PCR POSITIVE (*) NEGATIVE Final    Staphylococcus aureus POSITIVE (*) NEGATIVE Final      Labs: Results for orders placed during the hospital encounter of 12/15/11 (from the past 48 hour(s))  GLUCOSE, CAPILLARY     Status: Abnormal   Collection Time   12/18/11 12:17 PM      Component Value Range Comment   Glucose-Capillary 118 (*) 70 - 99 mg/dL    Comment 1 Notify RN     GLUCOSE, CAPILLARY     Status: Abnormal   Collection Time   12/18/11  5:24 PM      Component Value Range Comment   Glucose-Capillary 228 (*) 70 - 99 mg/dL   GLUCOSE, CAPILLARY     Status: Abnormal   Collection Time   12/18/11  9:33 PM      Component Value Range Comment   Glucose-Capillary 160 (*) 70 - 99 mg/dL   GLUCOSE, CAPILLARY     Status: Normal   Collection Time   12/19/11  7:50 AM      Component Value Range Comment   Glucose-Capillary 83  70 - 99 mg/dL    Comment 1 Notify RN     GLUCOSE, CAPILLARY     Status: Abnormal   Collection Time   12/19/11 12:22 PM      Component Value Range Comment   Glucose-Capillary 139 (*) 70 - 99 mg/dL    Comment 1 Notify RN     GLUCOSE, CAPILLARY     Status: Normal   Collection Time   12/19/11  5:31 PM      Component Value Range Comment   Glucose-Capillary 79  70 - 99 mg/dL    Comment 1 Notify RN     GLUCOSE, CAPILLARY     Status: Normal   Collection Time   12/19/11 10:09 PM      Component Value Range Comment   Glucose-Capillary 80  70 - 99 mg/dL    Comment 1 Notify RN      Comment 2 Documented in Chart     GLUCOSE, CAPILLARY     Status: Abnormal   Collection Time   12/20/11  7:52 AM      Component Value Range Comment   Glucose-Capillary 60 (*)  70 - 99 mg/dL    Comment 1 Notify RN     GLUCOSE, CAPILLARY     Status: Normal   Collection Time   12/20/11  8:23 AM       Component Value Range Comment   Glucose-Capillary 90  70 - 99 mg/dL      HPI  Jamie Romero is a 78 y.o. 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 he presented to Dr. Laverta [facility]  clinic  upon referral from Dr. Jimmey Ralph [facility]. Pt's PCP is Dr. Cato Mulligan. He saw a Nurse Practitioner last week and was started on oral doxycycline. He has a h/o nonhealing ulcer on his R heel for 10 months. He has recently had fever, chills and nausea per his daughter. He c/o increasing drainage and increasing size of the heel ulcer over the last month or so. Mostly wheelchair-bound and does not ambulate much  He was started on IV vancomycin and underwent R-BKA on 12/16/11 by Dr. Victorino Dike.  HOSPITAL COURSE:  Patient admitted for right foot diabetic ulcer with osteomyelitis requiring right BKA on 12/16/2011 by Dr. Toni Arthurs orthopedics, patient tolerated the procedure well. he does have history of diabetes mellitus and anemia of chronic disease, hemoglobin was around 7.2-7.5, when patient was admitted since he was going to OR he required 2 units of packed RBC transfusion to be optimized for surgery, post transfusion H&H appears stable.  Diabetic foot ulcer with right foot osteomyelitis - status post right BKA on 12/16/2011. As the infected area has been removed and abx were stopped after surgery. No phantom pain postoperatively. Per surgery, patient stable to be discharged to acute rehabilitation.Continue NWB on R LE. Pt will need to f/u with orthopedics in 10 - 14 days in clinic. Given his h/o anemia, I believe asa is sufficient for DVT prophylaxis upon discharge. He's ready for discharge from my perspective when a bed is available.  1.  2. Possible reaction of cefepime with rash : He has no h/o PCN allergy or any other abx allergy. This is likely rash from cefepime and  it should be avoided in the future. Pt clinically stable.  3. DM2 - home dose glipizide + ISS in the hospital, sliding scale discontinued. He will resume his oral hypoglycemics 4. Deconditioning : Pt will need inpatient rehab after discharge. CBG (last 3)   Basename  12/19/11 1222  12/19/11 0750  12/18/11 2133   GLUCAP  139*  83  160*    5. H/o hyperlipidemia, HTN - continue home meds for these   6. Anemia - HB in range of 9 but stable. Will monitor. likely anemia of chronic disease. Outpatient monitoring by PCP recommended at this time no acute issues. Will start glucerna       Discharge Exam:  Blood pressure 111/56, pulse 59, temperature 97.8 F (36.6 C), temperature source Oral, resp. rate 17, height 5\' 6"  (1.676 m), weight 69.446 kg (153 lb 1.6 oz), SpO2 96.00%.  Gen : Awake Alert, Oriented X 3, lying in bed  Head : [place].AT  Neck : Supple, No JVD, No cervical lymphadenopathy appriciated.  Lungs : GBAE CTAB  CVS : RRR,No Gallops,Rubs or new Murmurs  Abd : +ve B.Sounds, Abd Soft, Non tender  Skin : MP rash over the back and lower chest and upper abdomen noted. Resolving  Ext :  R BKA noted.         Discharge Orders    Future Appointments: Provider: Department: Dept Phone: Center:   12/22/2011 9:15 AM [facility] [phone] [facility]   03/06/2012 9:00 AM Lindley Magnus, MD [facility] [phone] [facility]      Follow-up Information    Follow up with Toni Arthurs, MD. Schedule an appointment as soon as possible for a visit in 1 week.   Contact information:   [address], [address] [place] [address] [phone]       Follow up with Judie Petit, MD. Schedule an appointment as soon as possible for a visit in 1 week.   Contact information:   [address] Christena Flake [place] [address] [phone]          Signed: Richarda Overlie 12/20/2011, 10:04 AM

## 2011-12-20 NOTE — Progress Notes (Signed)
Physical Therapy Treatment Patient Details Name: Jamie Romero MRN: [id number] DOB: Aug 12, 1922 Today's Date: 12/20/2011 Time: 5366-4403 PT Time Calculation (min): 31 min  PT Assessment / Plan / Recommendation Comments on Treatment Session  pt to transfer to CIR today.     Follow Up Recommendations  Post acute inpatient     Does the patient have the potential to tolerate intense rehabilitation  Yes, Recommend IP Rehab Screening  Barriers to Discharge        Equipment Recommendations  3 in 1 bedside comode    Recommendations for Other Services Rehab consult  Frequency Min 4X/week   Plan Discharge plan remains appropriate;Frequency remains appropriate    Precautions / Restrictions Precautions Precautions: Fall Restrictions Weight Bearing Restrictions: Yes RLE Weight Bearing: Non weight bearing   Pertinent Vitals/Pain No c/o pain     Mobility  Bed Mobility Bed Mobility: Sit to Supine Transfers Transfers: Sit to Stand;Stand to Sit;Stand Pivot Transfers;Anterior-Posterior Transfer Sit to Stand: 1: +2 Total assist;From chair/3-in-1;With upper extremity assist;With armrests Sit to Stand: Patient Percentage: 50% Stand to Sit: 1: +2 Total assist;To bed;To chair/3-in-1;With upper extremity assist;With armrests Stand to Sit: Patient Percentage: 50% Stand Pivot Transfers: 1: +2 Total assist Stand Pivot Transfers: Patient Percentage: 30% Anterior-Posterior Transfer: 2: Max assist;To level surface Details for Transfer Assistance: Total assist to initiate standing from recliner and 3in1 secondary to generalized weakness.  Step by step cueing for sequencing of movement for stand pivot transfer.  Assist to manage walker and maintain pt balance.  Assist to control descent to 3 in 1.  Instructed pt in anterior slide transfer from 3 in 1 to bed.  Max assist to scoot pt to bed secondary to UE weakness and pt's testicles becoming entrapped during transfer.     Ambulation/Gait Ambulation/Gait Assistance: Not tested (comment)    Exercises Amputee Exercises Straight Leg Raises: AROM;10 reps Chair Push Up: Strengthening;5 reps;Both   PT Diagnosis:    PT Problem List:   PT Treatment Interventions:     PT Goals Acute Rehab PT Goals PT Goal Formulation: With patient Time For Goal Achievement: 12/31/11 Potential to Achieve Goals: Good Pt will go Supine/Side to Sit: with modified independence Pt will go Sit to Supine/Side: with modified independence PT Goal: Sit to Supine/Side - Progress: Progressing toward goal Pt will go Sit to Stand: with min assist PT Goal: Sit to Stand - Progress: Progressing toward goal Pt will go Stand to Sit: with min assist PT Goal: Stand to Sit - Progress: Progressing toward goal Pt will Transfer Bed to Chair/Chair to Bed: with mod assist PT Transfer Goal: Bed to Chair/Chair to Bed - Progress: Progressing toward goal Pt will Perform Home Exercise Program: Independently PT Goal: Perform Home Exercise Program - Progress: Progressing toward goal  Visit Information  Last PT Received On: 12/20/11 Assistance Needed: +2    Subjective Data  Subjective: iIam going to rehab today   Cognition  Overall Cognitive Status: Impaired Area of Impairment: Following commands Orientation Level: Appears intact for tasks assessed;Other (comment) Behavior During Session: [facility] for tasks performed Following Commands: Follows multi-step commands with increased time Cognition - Other Comments: Required increased processing time during questions/commands.      Balance  Balance Balance Assessed: Yes Static Sitting Balance Static Sitting - Balance Support: No upper extremity supported;Feet supported Static Sitting - Level of Assistance: 7: Independent Static Standing Balance Static Standing - Balance Support: Bilateral upper extremity supported;During functional activity Static Standing - Level of Assistance: 3: Mod assist  Static  Standing - Comment/# of Minutes: Assist to steady pt while completing hygiene after bowel movement.  Pt presenting with anterior lean in standing.    End of Session PT - End of Session Equipment Utilized During Treatment: Gait belt;Right knee immobilizer Activity Tolerance: Patient tolerated treatment well Patient left: in bed;with call bell/phone within reach Nurse Communication: Mobility status   GP     [name] 12/20/2011, 3:02 PM [name] L. Araeya Lamb DPT [phone]

## 2011-12-20 NOTE — Progress Notes (Signed)
Inpt rehab bed is available today. Patient and his daughter at bedside are in agreement to admit today. I have contacted Dr. Susie Cassette and she is in agreement. Please call with any questions. [phone]

## 2011-12-20 NOTE — Plan of Care (Signed)
Overall Plan of Care [facility]) Patient Details Name: BROOK MALL MRN: [id number] DOB: Jul 12, 1922  Diagnosis:  Right bka, DPN  Primary Diagnosis:    Unilateral complete BKA Co-morbidities: DM, htn, AVR  Functional Problem List  Patient demonstrates impairments in the following areas: Balance, Bladder, Edema, Endurance, Medication Management, Nutrition, Pain, Safety, Sensory  and Skin Integrity  Basic ADL's: grooming, bathing, dressing and toileting Advanced ADL's: n/a at this time  Transfers:  bed mobility, bed to chair, toilet, tub/shower, car and furniture Locomotion:  ambulation and wheelchair mobility  Additional Impairments:  Leisure Awareness  Anticipated Outcomes Item Anticipated Outcome  Eating/Swallowing    Basic self-care  Supervision/set-up  Tolieting  Supervision  Bowel/Bladder  Mod I bladder, continent of bowel I  Transfers  S basic, min A car  Locomotion  Mod I w/c mobility  Communication    Cognition    Pain  Pain managed at or below pt's stated goal of 5  Safety/Judgment    Other     Therapy Plan: PT Frequency: 1-2 X/day, 60-90 minutes OT Frequency: 1-2 X/day, 60-90 minutes     Team Interventions: Item RN PT OT SLP SW TR Other  Self Care/Advanced ADL Retraining  x x      Neuromuscular Re-Education  x x      Therapeutic Activities  x x      UE/LE Strength Training/ROM  x x      UE/LE Coordination Activities  x x      Visual/Perceptual Remediation/Compensation         DME/Adaptive Equipment Instruction  x x      Therapeutic Exercise  x x      Balance/Vestibular Training  x x      Patient/Family Education x x x      Cognitive Remediation/Compensation         Functional Mobility Training  x x      Ambulation/Gait Training  x x      Stair Training  x       Wheelchair Propulsion/Positioning  x x      Functional Tourist information centre manager Reintegration  x x      Dysphagia/Aspiration Chemical engineer         Bladder Management x        Bowel Management         Disease Management/Prevention  x x      Pain Management x x x      Medication Management x        Skin Care/Wound Management x x x      Splinting/Orthotics  x x      Discharge Planning  x x      Psychosocial Support  x x                         Team Discharge Planning: Destination:  Home Projected Follow-up:  PT, OT and Home Health Projected Equipment Needs:  Bedside Commode and Tub Bench Will need appropriate leg rest/amputee pad for manual w/c Patient/family involved in discharge planning:  Yes  MD ELOS: 2 weeks Medical Rehab Prognosis:  Excellent Assessment: Pt admitted for CIR therapies. The team will be addressing self-care, pre-pros ed, pain mgt, wound care, safety, fxnl mobility. Goals are supervision to mod I overall. Fatigue is a big factor as well as premorbid DPN.

## 2011-12-20 NOTE — Progress Notes (Signed)
Subjective: 4 Days Post-Op Procedure(s) (LRB): AMPUTATION BELOW KNEE (Right) Patient reports pain as mild.  No phantom pain.  Knee immobilizer off.  Objective: Vital signs in last 24 hours: Temp:  [97.8 F (36.6 C)-98.2 F (36.8 C)] 97.8 F (36.6 C) (10/07 0625) Pulse Rate:  [57-67] 59  (10/07 0625) Resp:  [16-17] 17  (10/07 0625) BP: (107-111)/(49-56) 111/56 mmHg (10/07 0625) SpO2:  [96 %-99 %] 96 % (10/07 0625)  Intake/Output from previous day: 10/06 0701 - 10/07 0700 In: 1090 [P.O.:1090] Out: 400 [Urine:400] Intake/Output this shift:     Basename 12/17/11 1312  HGB 9.2*    Basename 12/17/11 1312  WBC 7.7  RBC 3.35*  HCT 27.7*  PLT 132*    Basename 12/18/11 0800  NA 134*  K 4.4  CL 104  CO2 27  BUN 18  CREATININE 1.00  GLUCOSE 57*  CALCIUM 8.0*   No results found for this basename: LABPT:2,INR:2 in the last 72 hours  R LE dressed and dry.  NTTp at stump.  Actively extends knee to full extension.  Assessment/Plan: 4 Days Post-Op Procedure(s) (LRB): AMPUTATION BELOW KNEE (Right) Up with therapy  Agree with SNF plan for acute rehab.  Continue NWB on R LE.  Pt will need to f/u with me in 10 - 14 days in clinic. Given his h/o anemia, I believe asa is sufficient for DVT prophylaxis upon discharge.  He's ready for discharge from my perspective when a bed is available.   Toni Arthurs 12/20/2011, 7:37 AM

## 2011-12-21 ENCOUNTER — Inpatient Hospital Stay (HOSPITAL_COMMUNITY): Payer: Medicare Other

## 2011-12-21 ENCOUNTER — Inpatient Hospital Stay (HOSPITAL_COMMUNITY): Payer: Medicare Other | Admitting: Occupational Therapy

## 2011-12-21 DIAGNOSIS — L98499 Non-pressure chronic ulcer of skin of other sites with unspecified severity: Secondary | ICD-10-CM

## 2011-12-21 DIAGNOSIS — I739 Peripheral vascular disease, unspecified: Secondary | ICD-10-CM

## 2011-12-21 DIAGNOSIS — Z5189 Encounter for other specified aftercare: Secondary | ICD-10-CM

## 2011-12-21 DIAGNOSIS — S88119A Complete traumatic amputation at level between knee and ankle, unspecified lower leg, initial encounter: Secondary | ICD-10-CM

## 2011-12-21 DIAGNOSIS — D62 Acute posthemorrhagic anemia: Secondary | ICD-10-CM

## 2011-12-21 LAB — CBC WITH DIFFERENTIAL/PLATELET
Basophils Relative: 0 % (ref 0–1)
Eosinophils Absolute: 0.2 10*3/uL (ref 0.0–0.7)
Hemoglobin: 8.9 g/dL — ABNORMAL LOW (ref 13.0–17.0)
MCH: 26.8 pg (ref 26.0–34.0)
MCHC: 32.6 g/dL (ref 30.0–36.0)
Monocytes Relative: 8 % (ref 3–12)
Neutrophils Relative %: 69 % (ref 43–77)

## 2011-12-21 LAB — COMPREHENSIVE METABOLIC PANEL
Albumin: 1.5 g/dL — ABNORMAL LOW (ref 3.5–5.2)
Alkaline Phosphatase: 126 U/L — ABNORMAL HIGH (ref 39–117)
BUN: 20 mg/dL (ref 6–23)
Calcium: 7.6 mg/dL — ABNORMAL LOW (ref 8.4–10.5)
Creatinine, Ser: 1.09 mg/dL (ref 0.50–1.35)
Potassium: 4.3 mEq/L (ref 3.5–5.1)
Total Protein: 4.8 g/dL — ABNORMAL LOW (ref 6.0–8.3)

## 2011-12-21 LAB — GLUCOSE, CAPILLARY
Glucose-Capillary: 60 mg/dL — ABNORMAL LOW (ref 70–99)
Glucose-Capillary: 68 mg/dL — ABNORMAL LOW (ref 70–99)
Glucose-Capillary: 182 mg/dL — ABNORMAL HIGH (ref 70–99)

## 2011-12-21 MED ORDER — ENSURE PUDDING PO PUDG
1.0000 | Freq: Three times a day (TID) | ORAL | Status: DC
  Administered 2011-12-21 – 2011-12-27 (×11): 1 via ORAL

## 2011-12-21 NOTE — Plan of Care (Signed)
Problem: RH PAIN MANAGEMENT Goal: RH STG PAIN MANAGED AT OR BELOW PT'S PAIN GOAL AT or below pts goal of 5  Outcome: Progressing No request for pain medication this shift

## 2011-12-21 NOTE — Progress Notes (Signed)
Physical Therapy Session Note  Patient Details  Name: Jamie Romero MRN: [id number] Date of Birth: Nov 29, 1922  Today's Date: 12/21/2011 Time: 2130-8657 Time Calculation (min): 28 min  Short Term Goals: Week 1:  PT Short Term Goal 1 (Week 1): Pt will be able to perform functional transfers with min A PT Short Term Goal 2 (Week 1): Pt will be able to complete sit to stand with mod A PT Short Term Goal 3 (Week 1): Pt will be able to prople w/c in controlled environment x 150' with S PT Short Term Goal 4 (Week 1): Pt will be able to complete HEP for LE ROM/STrengthenig with S  Pt still up in w/c but fatigued and asleep. Wanting to go back to bed to rest before next therapy session. Total A transfer squat pivot back to bed with focus on pt setting up w/c parts (min A) and positioning w/c for transfer. Introduced LE HEP for functional strengthening and ROM hip abduction, hip flexion, knee flexion/extension, quad sets, and SAQs. Pt falling asleep during session and asleep when therapist went out of room.    Therapy Documentation Precautions:  Precautions Precautions: Fall Restrictions Weight Bearing Restrictions: Yes RLE Weight Bearing: Non weight bearing Pain:  Denies pain.    Trunk/Postural Assessment :  See FIM for current functional status  Therapy/Group: Individual Therapy   [name] [facility] 12/21/2011, 2:08 PM

## 2011-12-21 NOTE — Progress Notes (Signed)
INITIAL ADULT NUTRITION ASSESSMENT Date: 12/21/2011   Time: 11:24 AM  Reason for Assessment: Malnutrition Screening and MST  INTERVENTION: 1. Obtain new weight as able 2. Discontinue Glucerna Shakes; add Ensure Pudding - per pt preference 3. Change diet to Dysphagia 3, per PA verbal order, to allow pt bread on trays and continue chopped meats 4. RD to add snacks in between meals and at HS 5. RD to continue to follow nutrition care plan  DOCUMENTATION CODES Per approved criteria  -Not Applicable   ASSESSMENT: Male 78 y.o.  Dx: Unilateral complete BKA  Hx:  Past Medical History  Diagnosis Date  . Valvular heart disease   . GERD (gastroesophageal reflux disease)   . Hypertension   . Hyperlipidemia   . Complication of anesthesia     "had problem extubating him after heart surgery; don't recall why" (12/15/2011)  . Peripheral vascular disease   . Anginal pain     "used to" (12/15/2011)  . Pneumonia 11/2011    "2nd bout w/it" (12/15/2011)  . Type II diabetes mellitus   . Iron (Fe) deficiency anemia   . History of blood transfusion   . Arthritis     "joints" (12/15/2011)  . Diabetic foot ulcer     right foot (12/15/2011)   Past Surgical History  Procedure Date  . Aortic valve replacement ?1990's    porcine  . Trauma with skull fracture ?late 1950's    "has plate in his head"  . Cardiac valve replacement   . Knee arthroscopy     "took cartilage out; both knees"  . Penectomy 2013    "it had grown over; had to reopen"  . Amputation 12/16/2011    Procedure: AMPUTATION BELOW KNEE;  Surgeon: Toni Arthurs, MD;  Location: [facility];  Service: Orthopedics;  Laterality: Right;   Related Meds:     . aspirin  81 mg Oral Daily  . atorvastatin  10 mg Oral Daily  . digoxin  125 mcg Oral Daily  . diphenhydrAMINE  12.5 mg Oral TID  . docusate sodium  200 mg Oral QHS  . enoxaparin  40 mg Subcutaneous Q24H  . feeding supplement  237 mL Oral TID BM  . ferrous sulfate  325 mg Oral BID  .  furosemide  20 mg Oral Daily  . glipiZIDE  5 mg Oral Daily  . hydrocortisone cream   Topical BID  . insulin aspart  0-9 Units Subcutaneous TID WC  . multivitamin with minerals  1 tablet Oral Daily  . pantoprazole  40 mg Oral BID AC  . ranolazine  500 mg Oral BID  . terazosin  10 mg Oral QHS  . vitamin C  500 mg Oral Daily  . vitamin E  400 Units Oral Daily  . zinc sulfate  220 mg Oral Daily   Ht: 5\' 6"  (167.6 cm)  Wt: 214 lb 8.1 oz (97.3 kg)  Ideal Wt: 67.3 kg/148 lb % Ideal Wt: 145%  Wt Readings from Last 25 Encounters:  12/20/11 214 lb 8.1 oz (97.3 kg)  12/16/11 153 lb 1.6 oz (69.446 kg)  12/16/11 153 lb 1.6 oz (69.446 kg)  12/06/11 170 lb (77.111 kg)  08/13/11 170 lb (77.111 kg)  04/23/11 170 lb (77.111 kg)  02/26/11 169 lb (76.658 kg)  06/01/10 169 lb (76.658 kg)  01/30/10 166 lb (75.297 kg)  09/26/09 167 lb 5 oz (75.892 kg)  06/27/09 173 lb (78.472 kg)  02/24/09 165 lb (74.844 kg)  10/31/08 176 lb (79.833  kg)  07/01/08 168 lb (76.204 kg)  02/26/08 177 lb (80.287 kg)  10/13/07 166 lb (75.297 kg)  01/25/07 161 lb (73.029 kg)  Usual Wt: 170 lb % Usual Wt: ?  Body mass index is 34.62 kg/(m^2). Obese Class I.  Food/Nutrition Related Hx: pt reports poor appetite PTA x 1 week  Labs:  CMP     Component Value Date/Time   NA 136 12/21/2011 0601   K 4.3 12/21/2011 0601   CL 106 12/21/2011 0601   CO2 26 12/21/2011 0601   GLUCOSE 55* 12/21/2011 0601   GLUCOSE 115* 03/04/2006 1152   BUN 20 12/21/2011 0601   CREATININE 1.09 12/21/2011 0601   CALCIUM 7.6* 12/21/2011 0601   PROT 4.8* 12/21/2011 0601   ALBUMIN 1.5* 12/21/2011 0601   AST 27 12/21/2011 0601   ALT 14 12/21/2011 0601   ALKPHOS 126* 12/21/2011 0601   BILITOT 0.5 12/21/2011 0601   GFRNONAA 59* 12/21/2011 0601   GFRAA 68* 12/21/2011 0601   CBG (last 3)   Basename 12/21/11 1116 12/21/11 0803 12/21/11 0739  GLUCAP 173* 87 68*      Intake/Output Summary (Last 24 hours) at 12/21/11 1127 Last data filed at 12/21/11  0900  Gross per 24 hour  Intake    320 ml  Output    375 ml  Net    -55 ml   Diet Order: Dysphagia 2 with thin liquids  Supplements/Tube Feeding: Glucerna Shake PO TID  IVF:    Estimated Nutritional Needs:   Kcal: 1900 - 2080 kcal Protein:  82 - 96 grams protein Fluid:  ~ 2 liters daily  Pt followed by RD during acute hospitalization. Pt stated that he has had poor intake x 1 week. Unable to state why his intake has been poor.  Questionable weight history: previous weight around 170 lb, however current weight is 214 lb. Nurse tech reports she will re-weigh pt when able.  Pt states that he is not eating right now because he is unable to get bread. States that he likes bread and cannot eat his meals without it. This RD called PA, Marissa Nestle, to discuss need for diet upgrade to suit patient's preferences. PA in agreement. Will change diet to Dysphagia 3 to allow chopped meats AND bread.  Pt is at risk for malnutrition given declining weights. Temporal wasting evident.  NUTRITION DIAGNOSIS: Increased nutrient needs r/t healing AEB recent BKA.  MONITORING/EVALUATION(Goals): Goal: Pt to meet >/= 90% of their estimated nutrition needs Monitor: weight trends, lab trends, I/O's, PO intake, supplement tolerance  EDUCATION NEEDS: -No education needs identified at this time  Jarold Motto MS, RD, LDN Pager: [phone] After-hours pager: [phone]

## 2011-12-21 NOTE — Evaluation (Signed)
Physical Therapy Assessment and Plan  Patient Details  Name: Jamie Romero MRN: [id number] Date of Birth: Oct 03, 1922  PT Diagnosis: Difficulty walking, Impaired sensation, Muscle weakness and Pain in residual limb Rehab Potential: Good ELOS: 12-14 days   Today's Date: 12/21/2011 Time: 1000-1100 Time Calculation (min): 60 min  Problem List:  Patient Active Problem List  Diagnosis  . DIABETES MELLITUS, TYPE II  . HYPERLIPIDEMIA  . HYPERTENSION  . GERD  . AORTIC VALVE REPLACEMENT, HX OF  . Primary osteoarthritis of both knees  . Diabetic foot ulcer with osteomyelitis  . Anemia  . Unilateral complete BKA    Past Medical History:  Past Medical History  Diagnosis Date  . Valvular heart disease   . GERD (gastroesophageal reflux disease)   . Hypertension   . Hyperlipidemia   . Complication of anesthesia     "had problem extubating him after heart surgery; don't recall why" (12/15/2011)  . Peripheral vascular disease   . Anginal pain     "used to" (12/15/2011)  . Pneumonia 11/2011    "2nd bout w/it" (12/15/2011)  . Type II diabetes mellitus   . Iron (Fe) deficiency anemia   . History of blood transfusion   . Arthritis     "joints" (12/15/2011)  . Diabetic foot ulcer     right foot (12/15/2011)   Past Surgical History:  Past Surgical History  Procedure Date  . Aortic valve replacement ?1990's    porcine  . Trauma with skull fracture ?late 1950's    "has plate in his head"  . Cardiac valve replacement   . Knee arthroscopy     "took cartilage out; both knees"  . Penectomy 2013    "it had grown over; had to reopen"  . Amputation 12/16/2011    Procedure: AMPUTATION BELOW KNEE;  Surgeon: Toni Arthurs, MD;  Location: [facility] OR;  Service: Orthopedics;  Laterality: Right;    Assessment & Plan Clinical Impression: Patient is a 78 y.o. year old male with recent admission to the hospital with history of DM, PVD with diabetic foot ulcer who was admitted on 12/15/11 with fever,  chills and nausea due to right foot wet gangrene with calcaneal osteomyelitis and charcot foot. He was noted to be anemic with Hgb @ 6.8 and transfused with 3 units PRBC. He was started on IV vancomycin and underwent R-BKA on 12/16/11 by Dr. Victorino Dike. Patient transferred to CIR on 12/20/2011 .   Patient currently requires max with mobility secondary to muscle weakness, decreased cardiorespiratoy endurance and decreased standing balance and decreased balance strategies.  Prior to hospitalization, patient was independent with mobility but required assistance with ADLs. Pt lived with Son;Other (Comment) (daughter in law) in a House   Home access is  Ramped entrance.  Patient will benefit from skilled PT intervention to maximize safe functional mobility, minimize fall risk and decrease caregiver burden for planned discharge home with 24 hour supervision.  Anticipate patient will benefit from follow up HH at discharge.  PT - End of Session Endurance Deficit: Yes PT Assessment Rehab Potential: Good PT Plan PT Frequency: 1-2 X/day, 60-90 minutes Estimated Length of Stay: 12-14 days PT Treatment/Interventions: Ambulation/gait training;Balance/vestibular training;Community reintegration;Discharge planning;Disease management/prevention;DME/adaptive equipment instruction;Functional mobility training;Neuromuscular re-education;Pain management;Patient/family education;Psychosocial support;Skin care/wound management;Splinting/orthotics;Stair training;Therapeutic Activities;Therapeutic Exercise;UE/LE Strength taining/ROM;UE/LE Coordination activities;Wheelchair propulsion/positioning PT Recommendation Follow Up Recommendations: Home health PT;24 hour supervision/assistance Equipment Recommended: 3 in 1 bedside comode;Tub/shower bench  PT Evaluation Precautions/Restrictions Precautions Precautions: Fall Restrictions Weight Bearing Restrictions: Yes RLE Weight Bearing: Non  weight bearing  Pain Pain  Assessment Pain Assessment: No/denies pain Pain Score: 0-No pain Faces Pain Scale: No hurt Reports phantom pain/sensation comes and goes.  Home Living/Prior Functioning Home Living Lives With: Son;Other (Comment) (daughter in law) Available Help at Discharge: Family;Available 24 hours/day Type of Home: House Home Access: Ramped entrance Home Layout: One level Bathroom Shower/Tub: Tub/shower unit;Curtain Firefighter: Standard Bathroom Accessibility: Yes How Accessible: Accessible via walker Home Adaptive Equipment: Wheelchair - manual;Shower chair with back;Wheelchair - powered;Walker - rolling Additional Comments:. Pt reports using power w/c for outdoors and has a car accessible to transporting power w/c. States that it does not fit in the house, so he used the manual chair in the house but it does not have any legrests:  asked pt to have family bring in manual w/c Prior Function Level of Independence: Independent with homemaking with ambulation;Independent with gait;Independent with transfers;Needs assistance with ADLs Driving: Yes Vocation: Retired Comments: patient reports CNA came every week to assist with showers, days she wasn't there patient stated he performed sponge baths at sink Vision/Perception  Vision - History Baseline Vision: No visual deficits Patient Visual Report: No change from baseline Vision - Assessment Eye Alignment: Within Functional Limits Perception Perception: Within Functional Limits Praxis Praxis: Intact  Cognition Overall Cognitive Status: Appears within functional limits for tasks assessed Orientation Level: Oriented X4 Memory: Appears intact Awareness: Appears intact Problem Solving: Appears intact Safety/Judgment: Appears intact Sensation Sensation Light Touch: Impaired Detail Light Touch Impaired Details: Impaired RLE;Impaired LLE Additional Comments: patient with complaints of minimal numbness & tingling throughout bilateral  fingertips Coordination Gross Motor Movements are Fluid and Coordinated: Yes Fine Motor Movements are Fluid and Coordinated: Yes Motor  Motor Motor: Within Functional Limits (R BKA)   Trunk/Postural Assessment  Cervical Assessment Cervical Assessment: Within Functional Limits Thoracic Assessment Thoracic Assessment: Within Functional Limits (kyphosis) Lumbar Assessment Lumbar Assessment: Exceptions to [facility] (decreased pelvic ant/post; rotation)  Balance Balance Balance Assessed: Yes Static Sitting Balance Static Sitting - Level of Assistance: 5: Stand by assistance Dynamic Sitting Balance Dynamic Sitting - Level of Assistance: 5: Stand by assistance Static Standing Balance Static Standing - Level of Assistance: 3: Mod assist (in parallel bars) Extremity Assessment  RUE Assessment RUE Assessment: Within Functional Limits (shoulder flextion = ~100degress; pt reports wreak when young) LUE Assessment LUE Assessment: Within Functional Limits (strength 4/5, can benefit from functional strengthening) RLE Assessment RLE Assessment: Exceptions to [facility] (R BKA, hip and knee AROM 3/5) LLE Assessment LLE Assessment: Exceptions to [facility] (decreased dorsiflexion, otherwise 3+/5; dec muscular enduran)  See FIM for current functional status Refer to Care Plan for Long Term Goals  Recommendations for other services: None  Discharge Criteria: Patient will be discharged from PT if patient refuses treatment 3 consecutive times without medical reason, if treatment goals not met, if there is a change in medical status, if patient makes no progress towards goals or if patient is discharged from hospital.  The above assessment, treatment plan, treatment alternatives and goals were discussed and mutually agreed upon: by patient  Individual therapy treatment initiated with focus on transfers and technique (squat pivot technique) requiring max A and surfaces uneven. Attempted sit to stand without AD and pt  unable with total A. In parallel bars able to complete sit to stand with max A and maintain static standing balance with posterior lean. W/c propulsion on unit with intermittent min A for steering/obstacle negotiation. While therapist adjusted leg rests to accommodate residual limb positioning, pt completed 15 reps  of bicep curls with 2 # dumbbells to aid with strengthening for mobility. Also education initiated on phantom pain and sensation.  [name] [facility] 12/21/2011, 11:17 AM

## 2011-12-21 NOTE — Patient Care Conference (Signed)
Inpatient RehabilitationTeam Conference Note Date: 12/21/2011   Time: 2:20 PM    Patient Name: Jamie Romero      Medical Record Number: [id number]  Date of Birth: 1922-11-09 Sex: Male         Room/Bed: 4006/4006-01 Payor Info: Payor: MEDICARE  Plan: MEDICARE PART A AND B  Product Type: *No Product type*     Admitting Diagnosis: RT BKA,MRSA  Admit Date/Time:  12/20/2011  3:01 PM Admission Comments: No comment available   Primary Diagnosis:  Unilateral complete BKA Principal Problem: Unilateral complete BKA  Patient Active Problem List   Diagnosis Date Noted  . Unilateral complete BKA 12/20/2011  . Diabetic foot ulcer with osteomyelitis 12/15/2011  . Anemia 12/15/2011  . Primary osteoarthritis of both knees 06/01/2010  . HYPERLIPIDEMIA 02/26/2008  . GERD 01/29/2007  . DIABETES MELLITUS, TYPE II 01/25/2007  . HYPERTENSION 01/25/2007  . AORTIC VALVE REPLACEMENT, HX OF 01/25/2007    Expected Discharge Date: Expected Discharge Date: 01/04/12  Team Members Present: Physician: Dr. Faith Rogue Social Worker Present: Amada Jupiter, LCSW Nurse Present: Daryll Brod, RN PT Present: Other (comment);[name], PT Sherrine Maples, PT) OT Present: Mackie Pai, OT;Patricia Mat Carne, OT Other (Discipline and Name): Tora Duck, PPS Coordinator     Current Status/Progress Goal Weekly Team Focus  Medical   right leg healing nicely. pain under control  pain, balance mgt. DM  increase mobility, intake   Bowel/Bladder   occasional incontinent episode spilling urinal continent bm lbm 10-7  mod independence  monitor bm's   Swallow/Nutrition/ Hydration             ADL's   mod assist with UB ADLs, total assist with LB ADLs, total assist for basic transfers  overall supervision  ADL retraining, ADL transfers, overall activity tolerance/endurance, UB strengthening   Mobility   max A transfers, min A/S w/c propulsion, total A for standing in parallel bars  S transfers, mod I w/c  mobility  endurance, functional transfers, strengthening, sit to stand   Communication             Safety/Cognition/ Behavioral Observations            Pain             Skin   multiple skin discolorations and edema due to reaction to antibiotic right bka incision   no new breakdown   monitor skin     Rehab Goals Patient on target to meet rehab goals: Yes *See Interdisciplinary Assessment and Plan and progress notes for long and short-term goals  Barriers to Discharge: premorbid left lower extremity sensory loss, foot drop    Possible Resolutions to Barriers:  endurance and mobility training    Discharge Planning/Teaching Needs:  Home with family able to provide 24/7 assistance if needed      Team Discussion:  Anticipate supervision w/c goals and need for a ramp.  Good start to CIR therapies  Revisions to Treatment Plan:  None   Continued Need for Acute Rehabilitation Level of Care: The patient requires daily medical management by a physician with specialized training in physical medicine and rehabilitation for the following conditions: Daily direction of a multidisciplinary physical rehabilitation program to ensure safe treatment while eliciting the highest outcome that is of practical value to the patient.: Yes Daily medical management of patient stability for increased activity during participation in an intensive rehabilitation regime.: Yes Daily analysis of laboratory values and/or radiology reports with any subsequent need for medication adjustment of medical  intervention for : Post surgical problems;Neurological problems;Other  Jamie Romero 12/21/2011, 3:55 PM

## 2011-12-21 NOTE — Evaluation (Signed)
Occupational Therapy Assessment and Plan & Session Notes  Patient Details  Name: Jamie Romero MRN: [id number] Date of Birth: Nov 18, 1922  OT Diagnosis: acute pain and muscle weakness (generalized) Rehab Potential: Rehab Potential: Good ELOS: 12-14 days   Today's Date: 12/21/2011  ASSESSMENT AND PLAN  Problem List:  Patient Active Problem List  Diagnosis  . DIABETES MELLITUS, TYPE II  . HYPERLIPIDEMIA  . HYPERTENSION  . GERD  . AORTIC VALVE REPLACEMENT, HX OF  . Primary osteoarthritis of both knees  . Diabetic foot ulcer with osteomyelitis  . Anemia  . Unilateral complete BKA    Past Medical History:  Past Medical History  Diagnosis Date  . Valvular heart disease   . GERD (gastroesophageal reflux disease)   . Hypertension   . Hyperlipidemia   . Complication of anesthesia     "had problem extubating him after heart surgery; don't recall why" (12/15/2011)  . Peripheral vascular disease   . Anginal pain     "used to" (12/15/2011)  . Pneumonia 11/2011    "2nd bout w/it" (12/15/2011)  . Type II diabetes mellitus   . Iron (Fe) deficiency anemia   . History of blood transfusion   . Arthritis     "joints" (12/15/2011)  . Diabetic foot ulcer     right foot (12/15/2011)   Past Surgical History:  Past Surgical History  Procedure Date  . Aortic valve replacement ?1990's    porcine  . Trauma with skull fracture ?late 1950's    "has plate in his head"  . Cardiac valve replacement   . Knee arthroscopy     "took cartilage out; both knees"  . Penectomy 2013    "it had grown over; had to reopen"  . Amputation 12/16/2011    Procedure: AMPUTATION BELOW KNEE;  Surgeon: Jamie Arthurs, MD;  Location: [facility] OR;  Service: Orthopedics;  Laterality: Right;   Clinical Impression:Jamie Romero is a 78 y.o. male with history of DM, PVD with diabetic foot ulcer who was admitted on 12/15/11 with fever, chills and nausea due to right foot wet gangrene with calcaneal osteomyelitis and charcot  foot. He was noted to be anemic with Hgb @ 6.8 and transfused with 3 units PRBC. He was started on IV vancomycin and underwent R-BKA on 12/16/11 by Jamie Romero. Post op developed rash--?antibiotic related as was on multiple antibiotics (cefipime, vancomycin). Therapies ongoing and rehab recommended for progression. Patient transferred to CIR on 12/20/2011 .    Patient currently requires mod->total assist with basic self-care skills and IADL secondary to muscle weakness and decreased sitting balance, decreased standing balance, decreased postural control and decreased balance strategies.  Prior to hospitalization, patient could complete most ADLs independently; patient did have a CNA assist with showers ~2Xper week.   Patient will benefit from skilled intervention to increase independence with basic self-care skills prior to discharge home with son and daughter-in-law.  Anticipate patient will require 24 hour supervision and follow up home health.  OT - End of Session Activity Tolerance: Tolerates 10 - 20 min activity with multiple rests Endurance Deficit: Yes OT Assessment Rehab Potential: Good Barriers to Discharge: None (none known at this time) OT Plan OT Frequency: 1-2 X/day, 60-90 minutes Estimated Length of Stay: 12-14 days OT Treatment/Interventions: Balance/vestibular training;Community reintegration;Discharge planning;DME/adaptive equipment instruction;Disease mangement/prevention;Functional mobility training;Neuromuscular re-education;Pain management;Patient/family education;Psychosocial support;Self Care/advanced ADL retraining;Skin care/wound managment;Splinting/orthotics;Therapeutic Activities;Therapeutic Exercise;UE/LE Strength taining/ROM;UE/LE Coordination activities;Wheelchair propulsion/positioning OT Recommendation Follow Up Recommendations: Home health OT Equipment Recommended: 3 in 1 bedside comode;Tub/shower  bench   Precautions/Restrictions  Precautions Precautions:  Fall Restrictions Weight Bearing Restrictions: Yes RLE Weight Bearing: Non weight bearing  General Chart Reviewed: Yes Family/Caregiver Present: No  Vital Signs Therapy Vitals Pulse Rate: 74   Pain Pain Assessment Pain Assessment: No/denies pain Pain Score: 0-No pain Faces Pain Scale: No hurt  Home Living/Prior Functioning Home Living Lives With: Son (and daughter-in-law) Available Help at Discharge: Family;Available 24 hours/day Type of Home: House Home Access: Ramped entrance Home Layout: One level Bathroom Shower/Tub: Tub/shower unit;Curtain Firefighter: Standard Bathroom Accessibility: Yes How Accessible: Accessible via walker Home Adaptive Equipment: Wheelchair - manual;Shower chair with back IADL History Homemaking Responsibilities: Yes Current License: Yes Occupation: Retired Type of Occupation: Holiday representative work Prior Function Level of Independence: Independent with homemaking with ambulation;Independent with gait;Independent with transfers;Needs assistance with ADLs Driving: Yes Vocation: Retired Comments: patient reports CNA came every week to assist with showers, days she wasn't there patient stated he performed sponge baths at sink  ADL - See FIM  Vision/Perception  Vision - History Baseline Vision: No visual deficits Patient Visual Report: No change from baseline Vision - Assessment Eye Alignment: Within Functional Limits Perception Perception: Within Functional Limits Praxis Praxis: Intact   Cognition Overall Cognitive Status: Appears within functional limits for tasks assessed Orientation Level: Oriented X4 Memory: Appears intact Awareness: Appears intact Problem Solving: Appears intact Safety/Judgment: Appears intact  Sensation Sensation Light Touch: Appears Intact Additional Comments: patient with complaints of minimal numbness & tingling throughout bilateral fingertips Coordination Gross Motor Movements are Fluid and  Coordinated: Yes Fine Motor Movements are Fluid and Coordinated: Yes  Motor - See Evaluation Navigator  Mobility - See Evaluation Navigator  Trunk/Postural Assessment - See Evaluation Navigator  Balance- See Evaluation Navigator  Extremity/Trunk Assessment RUE Assessment RUE Assessment: Within Functional Limits (shoulder flextion = ~100degress; pt reports wreak when young) LUE Assessment LUE Assessment: Within Functional Limits (strength 4/5, can benefit from functional strengthening)  See FIM for current functional status  Refer to Care Plan for Long Term Goals  Recommendations for other services: None  Discharge Criteria: Patient will be discharged from OT if patient refuses treatment 3 consecutive times without medical reason, if treatment goals not met, if there is a change in medical status, if patient makes no progress towards goals or if patient is discharged from hospital.  The above assessment, treatment plan, treatment alternatives and goals were discussed and mutually agreed upon: by patient   SESSION NOTES  Session #1 [phone] - 60 Minutes Individual Therapy No complaints of pain Initial 1:1 occupational therapy evaluation completed. Treatment focus on bed mobility, UB/LB bathing & dressing, dynamic sitting balance edge of bed, edge of bed -> recliner squat pivot transfer, and overall activity tolerance/endurance. Left patient seated in recliner with BLE elevated and call bell & phone within reach. Notified RN of patient's rash all over body and redness around sacrum area.   Session #2 [phone] - 30 Minutes Individual Therapy No complaints of pain Treatment focus on bed mobility, edge of bed -> w/c slide board transfer, and functional w/c mobility throughout room/hallway/unit. At end of session left patient seated in w/c with call bell & phone within reach. Recommend nursing perform slide board transfer with patient, patient min assist with slide board transfers.    Glenice Ciccone 12/21/2011, 9:49 AM

## 2011-12-21 NOTE — Progress Notes (Signed)
Orthopedic Tech Progress Note Patient Details:  Jamie Romero [birth year] [id number]  Patient ID: [name], male   DOB: 1922/10/19, 78 y.o.   MRN: [id number] Called advanced with brace order @1310   Nikki Dom 12/21/2011, 1:11 PM

## 2011-12-21 NOTE — Progress Notes (Signed)
Patient information reviewed and entered into eRehab system by Julieana Eshleman, RN, CRRN, PPS Coordinator.  Information including medical coding and functional independence measure will be reviewed and updated through discharge.     Per nursing patient was given "Data Collection Information Summary for Patients in Inpatient Rehabilitation Facilities with attached "Privacy Act Statement-Health Care Records" upon admission.  

## 2011-12-21 NOTE — Progress Notes (Signed)
Subjective/Complaints: No complaints. Pain under control. Slept well. A 12 point review of systems has been performed and if not noted above is otherwise negative.    Objective: Vital Signs: Blood pressure 110/50, pulse 81, temperature 97.5 F (36.4 C), temperature source Oral, resp. rate 19, height 5\' 6"  (1.676 m), weight 97.3 kg (214 lb 8.1 oz), SpO2 98.00%. No results found.  Basename 12/21/11 0601  WBC 4.7  HGB 8.9*  HCT 27.3*  PLT 121*    Basename 12/18/11 0800  NA 134*  K 4.4  CL 104  CO2 27  GLUCOSE 57*  BUN 18  CREATININE 1.00  CALCIUM 8.0*   CBG (last 3)   Basename 12/21/11 0739 12/21/11 0718 12/20/11 2055  GLUCAP 68* 60* 126*    Wt Readings from Last 3 Encounters:  12/20/11 97.3 kg (214 lb 8.1 oz)  12/16/11 69.446 kg (153 lb 1.6 oz)  12/16/11 69.446 kg (153 lb 1.6 oz)    Physical Exam:  Constitutional: He is oriented to person, place, and time.  Pleasant thin elderly male, NAD.  HENT:  Head: Normocephalic and atraumatic.  edentulous  Eyes: Pupils are equal, round, and reactive to light.  Neck: Normal range of motion.  Cardiovascular: Normal rate and regular rhythm.  Pulmonary/Chest: Effort normal and breath sounds normal.  Abdominal: Soft. Bowel sounds are normal. He exhibits no distension. There is no tenderness.  Musculoskeletal: He exhibits edema (2+ left pedal.).  R_BKA with compressive dressing in place.  Neurological: He is alert and oriented to person, place, and time.  Decreased sensation left foot/leg in a stocking glove distribution.. Left foot drop and weakness with eversion as well. LLE is grossly 4/5 except for affected areas which are grossly 1/5.  Skin: Skin is warm and dry. Rash (diffuse macular rash on chest, abdomen and back. BLE has some scabbing with rash and minimal rash with ecchymotic areas on bilateral forearms. ) noted.    Assessment/Plan: 1. Functional deficits secondary to right BKA  which require 3+ hours per day of  interdisciplinary therapy in a comprehensive inpatient rehab setting. Physiatrist is providing close team supervision and 24 hour management of active medical problems listed below. Physiatrist and rehab team continue to assess barriers to discharge/monitor patient progress toward functional and medical goals. FIM:                                  Medical Problem List and Plan:  1. DVT Prophylaxis/Anticoagulation: Pharmaceutical: Lovenox  2. Pain Management: Continue hydrocodone prn  3. Mood: team will provide egosupport as appropriate  4. Neuropsych: This patient is capable of making decisions on his/her own behalf.  5. DM type 2: Continue AC/HS cbg checks to keep BS controlled and help wound healing. Will add HS snack. Decrease glucotrol to 5 mg daily to avoid low BS. Will continue supplements.  6. HTN: Monitor with bid checks. Continue terazosin, ranexa, lasix, and digoxin. Lisinopril discontinued due to low BP.  7. GERD: On protonix.  8. Iron deficiency anemia: continue iron supplements. Recheck in am.  9. Hyponatremia: 134 today 10. H/o angina: continue ranexa.  11. Dermatitis: ?contact v/s reaction to mediation. Will monitor. Continue hydrocortisone cream bid. Vancomycin discontinued yesterday and doxycycline discontinued today  LOS (Days) 1 A FACE TO FACE EVALUATION WAS PERFORMED  SWARTZ,ZACHARY T 12/21/2011, 7:55 AM

## 2011-12-21 NOTE — Progress Notes (Signed)
Occupational Therapy Session Note  Patient Details  Name: SHAFI MORGANTE MRN: [id number] Date of Birth: [birth year]  Today's Date: 12/21/2011 Time: 1140-1210 Time Calculation (min): 30 min  Short Term Goals: Week 1:  OT Short Term Goal 1 (Week 1): Patient will perform UB dressing sitting edge of bed with min/steady assist OT Short Term Goal 2 (Week 1): Patient will perform LB dressing at bed level with moderate assistance OT Short Term Goal 3 (Week 1): Patient will perform toilet transfer with minimal assistance OT Short Term Goal 4 (Week 1): UE HEP will be implemented  Skilled Therapeutic Interventions/Progress Updates:  Self care retraining to include drop arm commode transfers and discussed techniques for safe clothing management and hygiene.  Focused session on sit><stand at sink with total assist and patient unable to fully stand, scoot transfer ><drop arm commode with max-mod assist and vcs secondary to patient instinctively reaches back to move his bottom and transfer became unsafe a few times.  Patient will benefit from continued review and practice of basic scoot techniques to include increase use of LLE to lift bottom.    Therapy Documentation Precautions:  Precautions Precautions: Fall Restrictions Weight Bearing Restrictions: Yes RLE Weight Bearing: Non weight bearing Pain: 2/10 RLE, "but it is bearable"  See FIM for current functional status  Therapy/Group: Individual Therapy  Keifer Habib 12/21/2011, 12:32 PM

## 2011-12-22 ENCOUNTER — Inpatient Hospital Stay (HOSPITAL_COMMUNITY): Payer: Medicare Other | Admitting: Occupational Therapy

## 2011-12-22 ENCOUNTER — Inpatient Hospital Stay (HOSPITAL_COMMUNITY): Payer: Medicare Other

## 2011-12-22 ENCOUNTER — Encounter: Payer: Self-pay | Admitting: Internal Medicine

## 2011-12-22 ENCOUNTER — Encounter (HOSPITAL_BASED_OUTPATIENT_CLINIC_OR_DEPARTMENT_OTHER): Payer: Medicare Other | Attending: General Surgery

## 2011-12-22 LAB — GLUCOSE, CAPILLARY
Glucose-Capillary: 91 mg/dL (ref 70–99)
Glucose-Capillary: 172 mg/dL — ABNORMAL HIGH (ref 70–99)
Glucose-Capillary: 185 mg/dL — ABNORMAL HIGH (ref 70–99)

## 2011-12-22 MED ORDER — TERAZOSIN HCL 5 MG PO CAPS
10.0000 mg | ORAL_CAPSULE | Freq: Every day | ORAL | Status: DC
  Administered 2011-12-22 – 2012-01-02 (×12): 10 mg via ORAL
  Filled 2011-12-22 (×13): qty 2

## 2011-12-22 MED ORDER — TRAZODONE HCL 50 MG PO TABS
25.0000 mg | ORAL_TABLET | Freq: Every day | ORAL | Status: DC
  Administered 2011-12-23 – 2012-01-02 (×10): 25 mg via ORAL
  Filled 2011-12-22 (×11): qty 1

## 2011-12-22 MED ORDER — TERAZOSIN HCL 5 MG PO CAPS: 10.0000 mg | ORAL_CAPSULE | Freq: Every day | ORAL | Status: DC

## 2011-12-22 NOTE — Progress Notes (Signed)
Physical Therapy Session Note  Patient Details  Name: Jamie Romero MRN: [id number] Date of Birth: 08/29/1922  Today's Date: 12/22/2011 Time: 1400-1440 Time Calculation (min): 40 min  Short Term Goals: Week 1:  PT Short Term Goal 1 (Week 1): Pt will be able to perform functional transfers with min A PT Short Term Goal 2 (Week 1): Pt will be able to complete sit to stand with mod A PT Short Term Goal 3 (Week 1): Pt will be able to prople w/c in controlled environment x 150' with S PT Short Term Goal 4 (Week 1): Pt will be able to complete HEP for LE ROM/STrengthenig with S  Skilled Therapeutic Interventions/Progress Updates:    Pt with complaint of fatigue throughout session and rest breaks given as needed. Functional transfer training using slideboard and focus on w/c management, set up of slide board, and safe technique. Able to complete transfer with min A w/c <-> mat but requires mod A from w/c to soft bed due to uneven surface. Supine LE therex for functional strengthening on RLE for hip abduction, hip flexion, knee flexion/extension, and quad sets x 2 set of 10 reps each. W/c propulsion limited due to fatigue and positioned back in bed at end of session to rest.  Therapy Documentation Precautions:  Precautions Precautions: Fall Restrictions Weight Bearing Restrictions: Yes RLE Weight Bearing: Non weight bearing    Pain:  Denies pain.    See FIM for current functional status  Therapy/Group: Individual Therapy  [name] [facility] 12/22/2011, 4:23 PM

## 2011-12-22 NOTE — Progress Notes (Signed)
Social Work Patient ID: Jamie Romero, male   DOB: 1922/08/26, 78 y.o.   MRN: [id number]  Have met with pt and spoken with his daughter-in-law via phone today to review team conference.  All aware of targeted d/c 10/22 with supervision w/c goals.  Both agreeable with ELS, however, daughter-in-law requests d/c date change to either day prior or after as she is already committed with other medical appointments on the 22nd.  Will alert therapy and MD to request.  No concerns about home management at this point.  Will continue to follow.  Leeah Politano

## 2011-12-22 NOTE — Progress Notes (Signed)
Social Work  Social Work Assessment and Plan  Patient Details  Name: Jamie Romero MRN: [id number] Date of Birth: 18-Aug-1922  Today's Date: 12/22/2011  Problem List:  Patient Active Problem List  Diagnosis  . DIABETES MELLITUS, TYPE II  . HYPERLIPIDEMIA  . HYPERTENSION  . GERD  . AORTIC VALVE REPLACEMENT, HX OF  . Primary osteoarthritis of both knees  . Diabetic foot ulcer with osteomyelitis  . Anemia  . Unilateral complete BKA   Past Medical History:  Past Medical History  Diagnosis Date  . Valvular heart disease   . GERD (gastroesophageal reflux disease)   . Hypertension   . Hyperlipidemia   . Complication of anesthesia     "had problem extubating him after heart surgery; don't recall why" (12/15/2011)  . Peripheral vascular disease   . Anginal pain     "used to" (12/15/2011)  . Pneumonia 11/2011    "2nd bout w/it" (12/15/2011)  . Type II diabetes mellitus   . Iron (Fe) deficiency anemia   . History of blood transfusion   . Arthritis     "joints" (12/15/2011)  . Diabetic foot ulcer     right foot (12/15/2011)   Past Surgical History:  Past Surgical History  Procedure Date  . Aortic valve replacement ?1990's    porcine  . Trauma with skull fracture ?late 1950's    "has plate in his head"  . Cardiac valve replacement   . Knee arthroscopy     "took cartilage out; both knees"  . Penectomy 2013    "it had grown over; had to reopen"  . Amputation 12/16/2011    Procedure: AMPUTATION BELOW KNEE;  Surgeon: Toni Arthurs, MD;  Location: [facility] OR;  Service: Orthopedics;  Laterality: Right;   Social History:  reports that he has quit smoking. His smoking use included Cigars. His smokeless tobacco use includes Chew. He reports that he does not drink alcohol or use illicit drugs.  Family / Support Systems Marital Status: Widow/Widower How Long?: approx 15 years Patient Roles: Parent;Other (Comment) (business owner) Children: son, Etienne Stedman @ [phone] and  daughter-in-law, Asher Muir @ [phone]; daughter, Teodora Medici (local) @ (C) [phone]  Other Supports: pt also has 6 more adult children with most out of town Anticipated Caregiver: Evalee Mutton, and daughter, Fannie Knee Ability/Limitations of Caregiver: Jillyn Hidden is an amputee with a prosthesis. Fannie Knee is an Charity fundraiser works nights; Pt reports both Jillyn Hidden and Asher Muir are on SSD, however, able to assist if needed Caregiver Availability: 24/7 Family Dynamics: Very supportive family and involved.  Pt describes son and his wife as willing to assist with any needs.   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  Makes some jokes during interview and denies any significant emotional distress in general or in relation to BKA.  Notes his son is also an amputee and that he was prepared (mentally) that this may happen for him as well. Recent Psychosocial Issues: None Pyschiatric History: None Substance Abuse History: None  Patient / Family Perceptions, Expectations & Goals Pt/Family  understanding of illness & functional limitations: pt and family with good understanding of pt's medical issues, rehab process, etc as they have "...gone through this before..." with son. Premorbid pt/family roles/activities: Pt was very independent PTA; Notes "...I probably spent about 30 minutes a day inside".  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  Pt notes he was the one who would cook breakfast for family and Jamie/ Jillyn Hidden would prepare dinner. Anticipated changes in roles/activities/participation: Little change anticipated except maybe initially upon d/c .   Pt/family expectations/goals: Pt looking forward to getting back to his business.  Community [employer] Agencies: None Premorbid Home Care/DME Agencies: Other (Comment) ([facility] PTA with [facility] & CNA) Transportation available at discharge: yes Resource referrals recommended: Support group (specify) (Amputee Support Group)  Discharge Planning Living Arrangements: Children Support Systems: Children Type of Residence: Private residence Insurance Resources: [employer] Financial Resources: Social Security Financial Screen Referred: No Living Expenses: Own Money Management: Patient;Family Do you have any problems obtaining your medications?: No Home Management: pt shares with son and daughter-in-law Patient/Family Preliminary Plans: Home with son and daughter-in-law who can provide 24/7 assistance if needed Social Work Anticipated Follow Up Needs: HH/OP;Support Group Expected length of stay: 2 weeks  Clinical Impression Very pleasant, oriented gentleman here after BKA.  Good family support and no emotional distress noted currently (will monitor).  Will follow for d/c planning and resumption of HH services.  Latandra Loureiro 12/22/2011, 1:43 PM

## 2011-12-22 NOTE — Progress Notes (Signed)
Orthopedic Tech Progress Note Patient Details:  RAJINDER DOBBERSTEIN 07/05/1922 [id number]  Patient ID: [name], male   DOB: [birth year], 78 y.o.   MRN: [id number]   Shawnie Pons 12/22/2011, 8:40 AM LEFT PRAFO COMPLETED BY [facility]

## 2011-12-22 NOTE — Progress Notes (Signed)
Physical Therapy Session Note  Patient Details  Name: Jamie Romero MRN: [id number] Date of Birth: 12/15/1922  Today's Date: 12/22/2011 Time: 1030-1128 Time Calculation (min): 58 min  Short Term Goals: Week 1:  PT Short Term Goal 1 (Week 1): Pt will be able to perform functional transfers with min A PT Short Term Goal 2 (Week 1): Pt will be able to complete sit to stand with mod A PT Short Term Goal 3 (Week 1): Pt will be able to prople w/c in controlled environment x 150' with S PT Short Term Goal 4 (Week 1): Pt will be able to complete HEP for LE ROM/STrengthenig with S  Skilled Therapeutic Interventions/Progress Updates:    focused on w/c propulsion for endurance and strengthening on unit, slide board transfers with emphasis on w/c parts management and set up, tricep push ups edge of mat (10 reps x 2 sets with rest breaks) for functional strengthening and to aid with transfers, seated therex for strengthening and ROM, and sit to stands x 3 reps with RW from elevated surface with mod A. Attempted and encouraged pt to hop or pivot foot but unable; also unable to take a hand off and maintain balance and declined to attempt further. Decreased stability in L knee noted and encouraged pt to have family bring in a shoe for support and stability in ankle as well.  Therapy Documentation Precautions:  Precautions Precautions: Fall Restrictions Weight Bearing Restrictions: Yes RLE Weight Bearing: Non weight bearing  Pain: Pain Assessment Pain Assessment: No/denies pain  See FIM for current functional status  Therapy/Group: Individual Therapy  [name] [facility] 12/22/2011, 12:50 PM

## 2011-12-22 NOTE — Progress Notes (Signed)
Occupational Therapy Session Notes  Patient Details  Name: Jamie Romero MRN: [id number] Date of Birth: [birth year]  Today's Date: 12/22/2011  Short Term Goals: Week 1:  OT Short Term Goal 1 (Week 1): Patient will perform UB dressing sitting edge of bed with min/steady assist OT Short Term Goal 2 (Week 1): Patient will perform LB dressing at bed level with moderate assistance OT Short Term Goal 3 (Week 1): Patient will perform toilet transfer with minimal assistance OT Short Term Goal 4 (Week 1): UE HEP will be implemented  Skilled Therapeutic Interventions/Progress Updates:   Session #1 [id number] - 55 Minutes Individual Therapy No complaints of pain Patient found supine in bed with HOB raised eating breakfast. Patient engaged in bed mobility to sit edge of bed with supervision, then completed UB bathing & dressing and bathing of BLEs. Patient then laid back down to perform peri care, and for RN to check skin breakdown/redness around sacrum area. Patient then sat up edge of bed with moderate assistance from therapist to thread BLEs into pants and laid back down to pull pants up to waist. When sitting back up edge of bed patient required moderate assistance again. From edge of bed patient transferred into w/c using slide board with moderate assistance. Patient then sat at sink in w/c to brush hair. At end of session left patient seated in w/c with LLE elevated and call bell & phone within reach.   Session #2 1130-1200 - 30 Minutes Individual Therapy No complaints of pain Patient found seated in w/c getting ready to eat lunch. Therapist encouraged patient to set lunch aside and participate in therapy, patient willing. Patient then engaged in w/c mobility ~15 feet throughout hallway and therapist propelled patient rest of the way to therapy gym. Patient then transferred from w/c -> therapy mat using slide board and min/steady assist from therapist. Patient then engaged in therapeutic exercise  of tricep dips. Patient with decreased strength and endurance during exercises. Patient transferred back to w/c with min/steady assist using slide board. Patient propelled self ~50 feet back to room. Left patient seated in w/c with call bell, phone, and lunch tray within reach.   Precautions:  Precautions Precautions: Fall Restrictions Weight Bearing Restrictions: Yes RLE Weight Bearing: Non weight bearing  See FIM for current functional status  [name] 12/22/2011, 8:26 AM

## 2011-12-22 NOTE — Progress Notes (Signed)
Subjective/Complaints: No complaints. Tolerated therapy yesterday . A 12 point review of systems has been performed and if not noted above is otherwise negative.    Objective: Vital Signs: Blood pressure 100/38, pulse 66, temperature 98.3 F (36.8 C), temperature source Oral, resp. rate 16, height 5\' 6"  (1.676 m), weight 74.844 kg (165 lb), SpO2 100.00%. No results found.  Basename 12/21/11 0601  WBC 4.7  HGB 8.9*  HCT 27.3*  PLT 121*    Basename 12/21/11 0601  NA 136  K 4.3  CL 106  CO2 26  GLUCOSE 55*  BUN 20  CREATININE 1.09  CALCIUM 7.6*   CBG (last 3)   Basename 12/22/11 0711 12/21/11 2056 12/21/11 1644  GLUCAP 91 182* 182*    Wt Readings from Last 3 Encounters:  12/21/11 74.844 kg (165 lb)  12/16/11 69.446 kg (153 lb 1.6 oz)  12/16/11 69.446 kg (153 lb 1.6 oz)    Physical Exam:  Constitutional: He is oriented to person, place, and time.  Pleasant thin elderly male, NAD.  HENT:  Head: Normocephalic and atraumatic.  edentulous  Eyes: Pupils are equal, round, and reactive to light.  Neck: Normal range of motion.  Cardiovascular: Normal rate and regular rhythm.  Pulmonary/Chest: Effort normal and breath sounds normal.  Abdominal: Soft. Bowel sounds are normal. He exhibits no distension. There is no tenderness.  Musculoskeletal: He exhibits edema (2+ left pedal.).  R_BKA with compressive dressing in place.  Neurological: He is alert and oriented to person, place, and time.  Decreased sensation left foot/leg in a stocking glove distribution.. Left foot drop and weakness with eversion as well. LLE is grossly 4/5 except for affected areas which are grossly 1/5.  Skin: Skin is warm and dry. Rash (diffuse macular rash on chest, abdomen and back. BLE has some scabbing with rash and minimal rash with ecchymotic areas on bilateral forearms. ) noted.    Assessment/Plan: 1. Functional deficits secondary to right BKA  which require 3+ hours per day of  interdisciplinary therapy in a comprehensive inpatient rehab setting. Physiatrist is providing close team supervision and 24 hour management of active medical problems listed below. Physiatrist and rehab team continue to assess barriers to discharge/monitor patient progress toward functional and medical goals. FIM: FIM - Bathing Bathing Steps Patient Completed: Chest;Right Arm;Left Arm;Abdomen;Front perineal area;Right upper leg;Left upper leg;Left lower leg (including foot) Bathing: 4: Min-Patient completes 8-9 19f 10 parts or 75+ percent  FIM - Upper Body Dressing/Undressing Upper body dressing/undressing steps patient completed: Thread/unthread right sleeve of front closure shirt/dress;Thread/unthread left sleeve of front closure shirt/dress;Pull shirt around back of front closure shirt/dress;Button/unbutton shirt Upper body dressing/undressing: 4: Steadying assist FIM - Lower Body Dressing/Undressing Lower body dressing/undressing steps patient completed: Thread/unthread right pants leg;Thread/unthread left pants leg;Don/Doff left sock Lower body dressing/undressing: 3: Mod-Patient completed 50-74% of tasks  FIM - Toileting Toileting: 0: Activity did not occur  FIM - Diplomatic Services operational officer Devices: Bedside commode (drop arm) Toilet Transfers: 0-Activity did not occur  FIM - Banker Devices: Sliding board Bed/Chair Transfer: 3: Supine > Sit: Mod A (lifting assist/Pt. 50-74%/lift 2 legs;3: Bed > Chair or W/C: Mod A (lift or lower assist)  FIM - Locomotion: Wheelchair Locomotion: Wheelchair: 2: Travels 50 - 149 ft with minimal assistance (Pt.>75%) FIM - Locomotion: Ambulation Locomotion: Ambulation: 0: Activity did not occur (unsafe to attempt)  Comprehension Comprehension Mode: Auditory Comprehension: 5-Understands complex 90% of the time/Cues < 10% of the time  Expression Expression  Mode: Verbal Expression:  5-Expresses complex 90% of the time/cues < 10% of the time  Social Interaction Social Interaction: 5-Interacts appropriately 90% of the time - Needs monitoring or encouragement for participation or interaction.  Problem Solving Problem Solving: 5-Solves basic problems: With no assist  Memory Memory: 5-Recognizes or recalls 90% of the time/requires cueing < 10% of the time  Medical Problem List and Plan:  1. DVT Prophylaxis/Anticoagulation: Pharmaceutical: Lovenox  2. Pain Management: Continue hydrocodone prn  3. Mood: team will provide egosupport as appropriate  4. Neuropsych: This patient is capable of making decisions on his/her own behalf.  5. DM type 2: Continue AC/HS cbg checks to keep BS controlled and help wound healing. Sugars normalizing with less am hypoglycemia 6. HTN: Monitor with bid checks. Continue terazosin, ranexa, lasix, and digoxin. Lisinopril discontinued due to low BP.  7. GERD: On protonix.  8. Iron deficiency anemia: continue iron supplements. Recheck level stable  -recheck thrusday 9. Hyponatremia: 134 today 10. H/o angina: continue ranexa.  11. Dermatitis: ?contact v/s reaction to mediation. Will monitor. Continue hydrocortisone cream bid.  LOS (Days) 2 A FACE TO FACE EVALUATION WAS PERFORMED  SWARTZ,ZACHARY T 12/22/2011, 8:38 AM

## 2011-12-23 ENCOUNTER — Inpatient Hospital Stay (HOSPITAL_COMMUNITY): Payer: Medicare Other

## 2011-12-23 ENCOUNTER — Inpatient Hospital Stay (HOSPITAL_COMMUNITY): Payer: Medicare Other | Admitting: Occupational Therapy

## 2011-12-23 LAB — GLUCOSE, CAPILLARY
Glucose-Capillary: 70 mg/dL (ref 70–99)
Glucose-Capillary: 179 mg/dL — ABNORMAL HIGH (ref 70–99)
Glucose-Capillary: 151 mg/dL — ABNORMAL HIGH (ref 70–99)

## 2011-12-23 NOTE — Plan of Care (Signed)
Problem: RH PAIN MANAGEMENT Goal: RH STG PAIN MANAGED AT OR BELOW PT'S PAIN GOAL AT or below pts goal of 5  No c/o pain

## 2011-12-23 NOTE — Progress Notes (Signed)
[facility] Individual Statement of Services  Patient Name:  Jamie Romero  Date:  12/23/2011  Welcome to the [facility].  Our goal is to provide you with an individualized program based on your diagnosis and situation, designed to meet your specific needs.  With this comprehensive rehabilitation program, you will be expected to participate in at least 3 hours of rehabilitation therapies Monday-Friday, with modified therapy programming on the weekends.  Your rehabilitation program will include the following services:  Physical Therapy (PT), Occupational Therapy (OT), 24 hour per day rehabilitation nursing, Therapeutic Recreaction (TR), Case Management (Social Worker), Rehabilitation Medicine, Nutrition Services and Pharmacy Services  Weekly team conferences will be held on Tuesdays to discuss your progress.  Your  Social Worker will talk with you frequently to get your input and to update you on team discussions.  Team conferences with you and your family in attendance may also be held.  Expected length of stay: 2 weeks  Overall anticipated outcome: Dr. Cato Mulligan  Depending on your progress and recovery, your program may change.  Your  Social Worker will coordinate services and will keep you informed of any changes.  Your  Social Worker's name and contact numbers are listed  below.  The following services may also be recommended but are not provided by the [facility]patient [facility]:   Driving Evaluations  Home Health Rehabiltiation Services  Outpatient Rehabilitatation [facility]  Vocational Rehabilitation   Arrangements will be made to provide these services after discharge if needed.  Arrangements include referral to agencies that provide these services.  Your insurance has been verified to be:  Medicare Your primary doctor is:  Dr. Cato Mulligan  Pertinent information will be shared with your doctor and your insurance company.  Social Worker:   [place], [place] [phone] or [phone]  Information discussed with and copy given to patient by: Amada Jupiter, 12/23/2011, 1:54 PM

## 2011-12-23 NOTE — Progress Notes (Signed)
Occupational Therapy Session Notes  Patient Details  Name: Jamie Romero MRN: [id number] Date of Birth: Jun 03, 1922  Today's Date: 12/23/2011  Short Term Goals: Week 1:  OT Short Term Goal 1 (Week 1): Patient will perform UB dressing sitting edge of bed with min/steady assist OT Short Term Goal 2 (Week 1): Patient will perform LB dressing at bed level with moderate assistance OT Short Term Goal 3 (Week 1): Patient will perform toilet transfer with minimal assistance OT Short Term Goal 4 (Week 1): UE HEP will be implemented  Skilled Therapeutic Interventions/Progress Updates:   Session #1 0900-1000 - 60 Minutes Individual Therapy No complaints of pain Patient found supine in bed with son and daughter-in-law present in room. Patient engaged in bed mobility in order to perform ADL retraining at bed level. Focused skilled intervention on bed mobility (side rolling left <-> right & supine -> sits), UB/LB bathing & dressing, slide board transfer from edge of bed -> w/c, grooming task of brushing hair at sink, overall activity tolerance/endurance, w/c mobility, and UE strengthening using SCIFIT machine in therapy gym. Son and daughter-in-law stepped out of room during ADL, but were present during UE exercise. Discussed some d/c planning with them and requested they come back closer to d/c for family education. Recommend family also bring in manual chair for modifications prn. At end of session left patient seated in w/c beside bed with call bell & phone within reach.   Session #2 4098-1191 - 45 Minutes Individual Therapy No complaints of pain Patient found supine in bed, without pants donned, eating lunch. Patient engaged in bed mobility in order to sit edge of bed to finish eating lunch. After lunch patient threaded bilateral LEs into pants and performed lateral leans in order to pull pants up to waist. Patient needed assistance with fastening pants and had to lay in supine position. Once pants  donned patient sat edge of bed and performed slide board transfer into w/c with min assist. Therapist then propelled patient to therapy gym to practice squat pivot transfers and scooting on therapy mat. Patient able to perform squat pivot transfer with moderate assistance from therapist, an improvement from total assist on eval day. At end of session therapist left patient seated in w/c beside bed with call bell & phone within reach.   Precautions:  Precautions Precautions: Fall Restrictions Weight Bearing Restrictions: Yes RLE Weight Bearing: Non weight bearing  See FIM for current functional status  Ehtan Delfavero 12/23/2011, 10:03 AM

## 2011-12-23 NOTE — Progress Notes (Signed)
Physical Therapy Session Note  Patient Details  Name: Jamie Romero MRN: [id number] Date of Birth: [birth year]  Today's Date: 12/23/2011 Time: 4696-2952 Time Calculation (min): 40 min  Short Term Goals: Week 1:  PT Short Term Goal 1 (Week 1): Pt will be able to perform functional transfers with min A PT Short Term Goal 2 (Week 1): Pt will be able to complete sit to stand with mod A PT Short Term Goal 3 (Week 1): Pt will be able to prople w/c in controlled environment x 150' with S PT Short Term Goal 4 (Week 1): Pt will be able to complete HEP for LE ROM/STrengthenig with S  Pt asleep in the bed upon entering the room. Pt states he thought he was done for the day and already took his pants off. Agreeable to therapy in the room for LE and core therex for functional strengthening to aid with mobility. Completed SAQ, sidelying hip abduction, sidelying hip extension, modified bridging, PNF diagonal seated EOB and mini curl ups in supine all 10 reps x 2 sets each. Able to complete bed mobility with S from flat surface without railings. Discussed that his bed at home is very high so may need to adjust if unable to complete stand pivot transfers safely at d/c and pt in agreement.      Therapy Documentation Precautions:  Precautions Precautions: Fall Restrictions Weight Bearing Restrictions: Yes RLE Weight Bearing: Non weight bearing  Pain:  No complaints of pain.  See FIM for current functional status  Therapy/Group: Individual Therapy  [name] [facility] 12/23/2011, 4:22 PM

## 2011-12-23 NOTE — Progress Notes (Signed)
Subjective/Complaints: No complaints. Busy with therapy yesterrday! Slept well . A 12 point review of systems has been performed and if not noted above is otherwise negative.    Objective: Vital Signs: Blood pressure 107/46, pulse 63, temperature 97.9 F (36.6 C), temperature source Oral, resp. rate 17, height 5\' 6"  (1.676 m), weight 75.3 kg (166 lb 0.1 oz), SpO2 97.00%. No results found.  Basename 12/21/11 0601  WBC 4.7  HGB 8.9*  HCT 27.3*  PLT 121*    Basename 12/21/11 0601  NA 136  K 4.3  CL 106  CO2 26  GLUCOSE 55*  BUN 20  CREATININE 1.09  CALCIUM 7.6*   CBG (last 3)   Basename 12/22/11 2105 12/22/11 1639 12/22/11 1158  GLUCAP 163* 185* 172*    Wt Readings from Last 3 Encounters:  12/23/11 75.3 kg (166 lb 0.1 oz)  12/16/11 69.446 kg (153 lb 1.6 oz)  12/16/11 69.446 kg (153 lb 1.6 oz)    Physical Exam:  Constitutional: He is oriented to person, place, and time.  Pleasant thin elderly male, NAD.  HENT:  Head: Normocephalic and atraumatic.  edentulous  Eyes: Pupils are equal, round, and reactive to light.  Neck: Normal range of motion.  Cardiovascular: Normal rate and regular rhythm.  Pulmonary/Chest: Effort normal and breath sounds normal.  Abdominal: Soft. Bowel sounds are normal. He exhibits no distension. There is no tenderness.  Musculoskeletal: He exhibits edema (2+ left pedal.).  R_BKA with compressive dressing in place.  Neurological: He is alert and oriented to person, place, and time.  Decreased sensation left foot/leg in a stocking glove distribution.. Left foot drop and weakness with eversion as well. LLE is grossly 4/5 except for affected areas which are grossly 1/5.  Skin: Skin is warm and dry. Rash (diffuse macular rash on chest, abdomen and back. BLE has some scabbing with rash and minimal rash with ecchymotic areas on bilateral forearms. ) is gradually improving.    Assessment/Plan: 1. Functional deficits secondary to right BKA  which  require 3+ hours per day of interdisciplinary therapy in a comprehensive inpatient rehab setting. Physiatrist is providing close team supervision and 24 hour management of active medical problems listed below. Physiatrist and rehab team continue to assess barriers to discharge/monitor patient progress toward functional and medical goals. FIM: FIM - Bathing Bathing Steps Patient Completed: Chest;Right Arm;Left Arm;Abdomen;Front perineal area;Right upper leg;Left upper leg;Left lower leg (including foot) Bathing: 4: Min-Patient completes 8-9 82f 10 parts or 75+ percent  FIM - Upper Body Dressing/Undressing Upper body dressing/undressing steps patient completed: Thread/unthread right sleeve of front closure shirt/dress;Thread/unthread left sleeve of front closure shirt/dress;Pull shirt around back of front closure shirt/dress;Button/unbutton shirt Upper body dressing/undressing: 4: Steadying assist FIM - Lower Body Dressing/Undressing Lower body dressing/undressing steps patient completed: Thread/unthread right pants leg;Thread/unthread left pants leg;Don/Doff left sock Lower body dressing/undressing: 3: Mod-Patient completed 50-74% of tasks  FIM - Toileting Toileting: 0: Activity did not occur  FIM - Diplomatic Services operational officer Devices: Bedside commode (drop arm) Toilet Transfers: 0-Activity did not occur  FIM - Banker Devices: Sliding board Bed/Chair Transfer: 5: Sit > Supine: Supervision (verbal cues/safety issues);3: Chair or W/C > Bed: Mod A (lift or lower assist)  FIM - Locomotion: Wheelchair Locomotion: Wheelchair: 2: Travels 50 - 149 ft with minimal assistance (Pt.>75%) FIM - Locomotion: Ambulation Locomotion: Ambulation: 0: Activity did not occur (unsafe to attempt)  Comprehension Comprehension Mode: Auditory Comprehension: 5-Understands basic 90% of the time/requires cueing <  10% of the time  Expression Expression  Mode: Verbal Expression: 5-Expresses basic needs/ideas: With extra time/assistive device  Social Interaction Social Interaction: 5-Interacts appropriately 90% of the time - Needs monitoring or encouragement for participation or interaction.  Problem Solving Problem Solving: 5-Solves basic 90% of the time/requires cueing < 10% of the time  Memory Memory: 5-Recognizes or recalls 90% of the time/requires cueing < 10% of the time  Medical Problem List and Plan:  1. DVT Prophylaxis/Anticoagulation: Pharmaceutical: Lovenox  2. Pain Management: Continue hydrocodone prn  3. Mood: team will provide egosupport as appropriate  4. Neuropsych: This patient is capable of making decisions on his/her own behalf.  5. DM type 2: Continue AC/HS cbg checks to keep BS controlled and help wound healing. Sugars normalizing with less am hypoglycemia 6. HTN: Monitor with bid checks. Continue terazosin, ranexa, lasix, and digoxin. Lisinopril discontinued due to low BP.  7. GERD: On protonix.  8. Iron deficiency anemia: continue iron supplements. Recheck level stable  -recheck today 9. Hyponatremia: 134 today 10. H/o angina: continue ranexa.  11. Dermatitis: hydrocortisone cream.  LOS (Days) 3 A FACE TO FACE EVALUATION WAS PERFORMED  Jamie Romero T 12/23/2011, 7:20 AM

## 2011-12-23 NOTE — Progress Notes (Signed)
Physical Therapy Session Note  Patient Details  Name: Jamie Romero MRN: [id number] Date of Birth: 09-04-1922  Today's Date: 12/23/2011 Time: 1045-1127 (42 minutes)  Short Term Goals: Week 1:  PT Short Term Goal 1 (Week 1): Pt will be able to perform functional transfers with min A PT Short Term Goal 2 (Week 1): Pt will be able to complete sit to stand with mod A PT Short Term Goal 3 (Week 1): Pt will be able to prople w/c in controlled environment x 150' with S PT Short Term Goal 4 (Week 1): Pt will be able to complete HEP for LE ROM/STrengthenig with S  Skilled Therapeutic Interventions/Progress Updates:    w/c propulsion on unit for endurance and strengthening with S. Sit to stands in parallel bars with max A and gait trials x 2 with mod A x 4' and 2' with seated rest breaks.Seated bilateral therex to aid with transfers and mobility with 2 # ankle weight on LLE for LAQs and seated marches x 10 reps x 3 sets each. Steady A transfer back to bed with slide board to rest before afternoon therapy sessions.   Pt expressed concern in regards to not being able to do "the things I used to be able to do." Support and encouragement provided to pt.  Therapy Documentation Precautions:  Precautions Precautions: Fall Restrictions Weight Bearing Restrictions: Yes RLE Weight Bearing: Non weight bearing Pain:  Denies pain.  See FIM for current functional status  Therapy/Group: Individual Therapy  [name] [facility] 12/23/2011, 11:30 AM

## 2011-12-24 ENCOUNTER — Inpatient Hospital Stay (HOSPITAL_COMMUNITY): Payer: Medicare Other

## 2011-12-24 ENCOUNTER — Inpatient Hospital Stay (HOSPITAL_COMMUNITY): Payer: Medicare Other | Admitting: Occupational Therapy

## 2011-12-24 LAB — GLUCOSE, CAPILLARY: Glucose-Capillary: 80 mg/dL (ref 70–99)

## 2011-12-24 NOTE — Progress Notes (Signed)
Occupational Therapy Session Notes  Patient Details  Name: Jamie Romero MRN: [id number] Date of Birth: 10/03/1922  Today's Date: 12/24/2011  Short Term Goals: Week 1:  OT Short Term Goal 1 (Week 1): Patient will perform UB dressing sitting edge of bed with min/steady assist OT Short Term Goal 2 (Week 1): Patient will perform LB dressing at bed level with moderate assistance OT Short Term Goal 3 (Week 1): Patient will perform toilet transfer with minimal assistance OT Short Term Goal 4 (Week 1): UE HEP will be implemented  Skilled Therapeutic Interventions/Progress Updates:   Session #1 [phone] - 55 Minutes Individual Therapy Patient with 10/10 complaints of shooting pain in LLE/foot; RN made aware Patient found supine in bed. Engaged in bed mobility for ADL retraining at bed level. Patient with shooting pain throughout left foot, but willing and motivated to work through the pain. Focused skilled intervention on pain management, bed mobility, UB/LB bathing & dressing, dynamic sitting balance/tolerance/endurance, lateral leans to pull underwear & pants over waist, overall activity tolerance/endurance, and squat pivot transfer from bed -> w/c with mod assist from therapist. After bed ADL patient sat in front of sink to brush hair and then therapist propelled patient to therapy gym for UE strengthening & overall endurance exercise using SCIFIT machine. At end of session left patient seated in w/c beside bed with call bell, phone, and breakfast tray within reach.   Session #2 [phone] - 55 Minutes Individual Therapy No complaints of pain Upon entering room patient found seated in w/c. Patient propelled self out of room and worked on overall endurance and UE strengthening by pulling therapist along behind w/c. After propelling therapist ~20 feet therapist propelled patient -> ADL apartment for simulated tub/shower transfer on/off tub transfer bench. Patient required min assist -> tub  bench and mod assist from bench -> w/c. After transfer patient propelled self -> therapy gym to transfer onto therapy mat and engage in Wii therapeutic exercise. At end of session patient left seated in w/c with call bell & phone within reach.   Precautions:  Precautions Precautions: Fall Restrictions Weight Bearing Restrictions: Yes RLE Weight Bearing: Non weight bearing  See FIM for current functional status  Betzaira Mentel 12/24/2011, 8:30 AM

## 2011-12-24 NOTE — Progress Notes (Signed)
Subjective/Complaints: No complaints. Slept well. Pain controlled . A 12 point review of systems has been performed and if not noted above is otherwise negative.    Objective: Vital Signs: Blood pressure 114/45, pulse 67, temperature 97.7 F (36.5 C), temperature source Oral, resp. rate 17, height 5\' 6"  (1.676 m), weight 76 kg (167 lb 8.8 oz), SpO2 97.00%. No results found. No results found for this basename: WBC:2,HGB:2,HCT:2,PLT:2 in the last 72 hours No results found for this basename: NA:2,K:2,CL:2,CO2:2,GLUCOSE:2,BUN:2,CREATININE:2,CALCIUM:2 in the last 72 hours CBG (last 3)   Basename 12/24/11 0714 12/23/11 2037 12/23/11 1642  GLUCAP 80 151* 179*    Wt Readings from Last 3 Encounters:  12/24/11 76 kg (167 lb 8.8 oz)  12/16/11 69.446 kg (153 lb 1.6 oz)  12/16/11 69.446 kg (153 lb 1.6 oz)    Physical Exam:  Constitutional: He is oriented to person, place, and time.  Pleasant thin elderly male, NAD.  HENT:  Head: Normocephalic and atraumatic.  edentulous  Eyes: Pupils are equal, round, and reactive to light.  Neck: Normal range of motion.  Cardiovascular: Normal rate and regular rhythm.  Pulmonary/Chest: Effort normal and breath sounds normal.  Abdominal: Soft. Bowel sounds are normal. He exhibits no distension. There is no tenderness.  Musculoskeletal: He exhibits edema (2+ left pedal.).  R_BKA with compressive dressing in place.  Neurological: He is alert and oriented to person, place, and time.  Decreased sensation left foot/leg in a stocking glove distribution.. Left foot drop and weakness with eversion as well. LLE is grossly 4/5 except for affected areas which are grossly 1/5.  Skin: Skin is warm and dry. Rash gradually improving   Assessment/Plan: 1. Functional deficits secondary to right BKA  which require 3+ hours per day of interdisciplinary therapy in a comprehensive inpatient rehab setting. Physiatrist is providing close team supervision and 24 hour  management of active medical problems listed below. Physiatrist and rehab team continue to assess barriers to discharge/monitor patient progress toward functional and medical goals. FIM: FIM - Bathing Bathing Steps Patient Completed: Chest;Right Arm;Abdomen;Left Arm;Front perineal area;Buttocks;Right upper leg;Left upper leg;Left lower leg (including foot) Bathing: 5: Supervision: Safety issues/verbal cues  FIM - Upper Body Dressing/Undressing Upper body dressing/undressing steps patient completed: Thread/unthread right sleeve of front closure shirt/dress;Thread/unthread left sleeve of front closure shirt/dress;Pull shirt around back of front closure shirt/dress;Button/unbutton shirt Upper body dressing/undressing: 5: Set-up assist to: Obtain clothing/put away FIM - Lower Body Dressing/Undressing Lower body dressing/undressing steps patient completed: Thread/unthread right pants leg;Thread/unthread left pants leg;Pull pants up/down;Don/Doff left shoe Lower body dressing/undressing: 3: Mod-Patient completed 50-74% of tasks  FIM - Toileting Toileting: 3: Mod-Patient completed 2 of 3 steps  FIM - Diplomatic Services operational officer Devices: Bedside commode (drop arm) Toilet Transfers: 0-Activity did not occur  FIM - Banker Devices: Sliding board;Arm rests Bed/Chair Transfer: 4: Chair or W/C > Bed: Min A (steadying Pt. > 75%)  FIM - Locomotion: Wheelchair Locomotion: Wheelchair: 2: Travels 50 - 149 ft with supervision, cueing or coaxing FIM - Locomotion: Ambulation Locomotion: Ambulation Assistive Devices: Parallel bars Ambulation/Gait Assistance: 3: Mod assist Locomotion: Ambulation: 1: Travels less than 50 ft with moderate assistance (Pt: 50 - 74%)  Comprehension Comprehension Mode: Auditory Comprehension: 6-Follows complex conversation/direction: With extra time/assistive device  Expression Expression Mode: Verbal Expression:  5-Expresses complex 90% of the time/cues < 10% of the time  Social Interaction Social Interaction: 6-Interacts appropriately with others with medication or extra time (anti-anxiety, antidepressant).  Problem Solving Problem Solving:  5-Solves complex 90% of the time/cues < 10% of the time  Memory Memory: 5-Recognizes or recalls 90% of the time/requires cueing < 10% of the time  Medical Problem List and Plan:  1. DVT Prophylaxis/Anticoagulation: Pharmaceutical: Lovenox  2. Pain Management: Continue hydrocodone prn  3. Mood: team will provide egosupport as appropriate  4. Neuropsych: This patient is capable of making decisions on his/her own behalf.  5. DM type 2: Continue AC/HS cbg checks to keep BS controlled and help wound healing. Sugars normalizing with less am hypoglycemia 6. HTN: Monitor with bid checks. Continue terazosin, ranexa, lasix, and digoxin. Lisinopril discontinued due to low BP.  7. GERD: On protonix.  8. Iron deficiency anemia: continue iron supplements. hgb around 9 currently- recheck monday 9. Hyponatremia: resolving 10. H/o angina: continue ranexa.  11. Dermatitis: hydrocortisone cream.  LOS (Days) 4 A FACE TO FACE EVALUATION WAS PERFORMED  Prosper Paff T 12/24/2011, 7:31 AM

## 2011-12-24 NOTE — Progress Notes (Signed)
Occupational Therapy Note  Patient Details  Name: Jamie Romero MRN: [id number] Date of Birth: [birth year] Today's Date: 12/24/2011  Time: 1130-1200 Pt denies pain Individual therapy Pt in bed resting upon arrival but agreeable to practicing bed mobility and squat pivot transfers bed<>w/c.  Pt performed squat pivot transfers with mod A.  Practiced sit><stand from EOB to facilitate increased lift with squat pivot transfers.  Pt remained in w/c to eat lunch at end of session.   Lavone Neri [facility] 12/24/2011, 12:01 PM

## 2011-12-24 NOTE — Progress Notes (Signed)
Physical Therapy Session Note  Patient Details  Name: Jamie Romero MRN: [id number] Date of Birth: June 08, 1922  Today's Date: 12/24/2011 Time: 0920-1005 Time Calculation (min): 45 min  Short Term Goals: Week 1:  PT Short Term Goal 1 (Week 1): Pt will be able to perform functional transfers with min A PT Short Term Goal 2 (Week 1): Pt will be able to complete sit to stand with mod A PT Short Term Goal 3 (Week 1): Pt will be able to prople w/c in controlled environment x 150' with S PT Short Term Goal 4 (Week 1): Pt will be able to complete HEP for LE ROM/STrengthenig with S  Skilled Therapeutic Interventions/Progress Updates:    Scoot pivot transfer training to/from mat with steady A on level surface. Using push up blocks completed 2 sets of 10 reps to aid with transfer technique and UE strengthening. Supine on mat modified bridging and mini crunches 10 reps x 3 sets each for hip and core strengthening. Rest breaks needed between sets. Bed mobility retraining with mod A for supine to sit, pt tends to rock and encouraged rolling onto side and then sitting up. W/c propulsion back to room for endurance and strengthening.   Therapy Documentation Precautions:  Precautions Precautions: Fall Restrictions Weight Bearing Restrictions: Yes RLE Weight Bearing: Non weight bearing    Pain: c/o "almost 10/10" pain in L foot - medication already received.  See FIM for current functional status  Therapy/Group: Individual Therapy  [name] [facility] 12/24/2011, 10:10 AM

## 2011-12-25 ENCOUNTER — Inpatient Hospital Stay (HOSPITAL_COMMUNITY): Payer: Medicare Other | Admitting: Physical Therapy

## 2011-12-25 ENCOUNTER — Inpatient Hospital Stay (HOSPITAL_COMMUNITY): Payer: Medicare Other | Admitting: Occupational Therapy

## 2011-12-25 LAB — GLUCOSE, CAPILLARY
Glucose-Capillary: 100 mg/dL — ABNORMAL HIGH (ref 70–99)
Glucose-Capillary: 196 mg/dL — ABNORMAL HIGH (ref 70–99)
Glucose-Capillary: 210 mg/dL — ABNORMAL HIGH (ref 70–99)
Glucose-Capillary: 134 mg/dL — ABNORMAL HIGH (ref 70–99)

## 2011-12-25 NOTE — Progress Notes (Signed)
Subjective/Complaints: No new issues reported  . A 12 point review of systems has been performed and if not noted above is otherwise negative.    Objective: Vital Signs: Blood pressure 118/51, pulse 63, temperature 97.9 F (36.6 C), temperature source Oral, resp. rate 17, height 5\' 6"  (1.676 m), weight 76 kg (167 lb 8.8 oz), SpO2 98.00%. No results found. No results found for this basename: WBC:2,HGB:2,HCT:2,PLT:2 in the last 72 hours No results found for this basename: NA:2,K:2,CL:2,CO2:2,GLUCOSE:2,BUN:2,CREATININE:2,CALCIUM:2 in the last 72 hours CBG (last 3)   Basename 12/25/11 0739 12/24/11 2104 12/24/11 1629  GLUCAP 100* 136* 92    Wt Readings from Last 3 Encounters:  12/24/11 76 kg (167 lb 8.8 oz)  12/16/11 69.446 kg (153 lb 1.6 oz)  12/16/11 69.446 kg (153 lb 1.6 oz)    Physical Exam:  Constitutional: He is oriented to person, place, and time.  Pleasant thin elderly male, NAD.  HENT:  Head: Normocephalic and atraumatic.  edentulous  Eyes: Pupils are equal, round, and reactive to light.  Neck: Normal range of motion.  Cardiovascular: Normal rate and regular rhythm.  Pulmonary/Chest: Effort normal and breath sounds normal.  Abdominal: Soft. Bowel sounds are normal. He exhibits no distension. There is no tenderness.  Musculoskeletal: He exhibits edema (2+ left pedal.).  R_BKA with compressive dressing in place.  Neurological: He is alert and oriented to person, place, and time.  Decreased sensation left foot/leg in a stocking glove distribution.. Left foot drop and weakness with eversion as well. LLE is grossly 4/5 except for affected areas which are grossly 1/5.  Skin: Skin is warm and dry. Rash gradually improving. Incision in tact   Assessment/Plan: 1. Functional deficits secondary to right BKA  which require 3+ hours per day of interdisciplinary therapy in a comprehensive inpatient rehab setting. Physiatrist is providing close team supervision and 24 hour  management of active medical problems listed below. Physiatrist and rehab team continue to assess barriers to discharge/monitor patient progress toward functional and medical goals. FIM: FIM - Bathing Bathing Steps Patient Completed: Chest;Right Arm;Left Arm;Abdomen;Front perineal area;Buttocks;Right upper leg;Left upper leg;Left lower leg (including foot) Bathing: 5: Supervision: Safety issues/verbal cues  FIM - Upper Body Dressing/Undressing Upper body dressing/undressing steps patient completed: Thread/unthread right sleeve of front closure shirt/dress;Thread/unthread left sleeve of front closure shirt/dress;Pull shirt around back of front closure shirt/dress;Button/unbutton shirt Upper body dressing/undressing: 5: Set-up assist to: Obtain clothing/put away FIM - Lower Body Dressing/Undressing Lower body dressing/undressing steps patient completed: Thread/unthread right underwear leg;Thread/unthread left underwear leg;Pull underwear up/down;Thread/unthread right pants leg;Thread/unthread left pants leg;Pull pants up/down Lower body dressing/undressing: 4: Min-Patient completed 75 plus % of tasks (for sock and shoe secondary to pain)  FIM - Toileting Toileting: 0: Activity did not occur  FIM - Diplomatic Services operational officer Devices: Bedside commode (drop arm) Toilet Transfers: 0-Activity did not occur  FIM - Banker Devices: Sliding board;Arm rests Bed/Chair Transfer: 4: Supine > Sit: Min A (steadying Pt. > 75%/lift 1 leg);3: Bed > Chair or W/C: Mod A (lift or lower assist) (squat pivot, no slideboard)  FIM - Locomotion: Wheelchair Locomotion: Wheelchair: 2: Travels 50 - 149 ft with supervision, cueing or coaxing FIM - Locomotion: Ambulation Locomotion: Ambulation Assistive Devices: Parallel bars Ambulation/Gait Assistance: 3: Mod assist Locomotion: Ambulation: 1: Travels less than 50 ft with moderate assistance (Pt: 50 -  74%)  Comprehension Comprehension Mode: Auditory Comprehension: 6-Follows complex conversation/direction: With extra time/assistive device  Expression Expression Mode: Verbal Expression: 6-Expresses complex  ideas: With extra time/assistive device  Social Interaction Social Interaction: 6-Interacts appropriately with others with medication or extra time (anti-anxiety, antidepressant).  Problem Solving Problem Solving: 5-Solves complex 90% of the time/cues < 10% of the time  Memory Memory: 6-More than reasonable amt of time  Medical Problem List and Plan:  1. DVT Prophylaxis/Anticoagulation: Pharmaceutical: Lovenox  2. Pain Management: Continue hydrocodone prn  3. Mood: team will provide egosupport as appropriate  4. Neuropsych: This patient is capable of making decisions on his/her own behalf.  5. DM type 2: Continue AC/HS cbg checks to keep BS controlled and help wound healing. Sugars normalizing with less am hypoglycemia 6. HTN: Monitor with bid checks. Continue terazosin, ranexa, lasix, and digoxin. Lisinopril discontinued due to low BP.  7. GERD: On protonix.  8. Iron deficiency anemia: continue iron supplements. hgb around 9 currently- recheck monday 9. Hyponatremia: resolving 10. H/o angina: continue ranexa.  11. Dermatitis: hydrocortisone cream.  LOS (Days) 5 A FACE TO FACE EVALUATION WAS PERFORMED  SWARTZ,ZACHARY T 12/25/2011, 9:07 AM

## 2011-12-25 NOTE — Progress Notes (Signed)
Physical Therapy Note  Patient Details  Name: Jamie Romero MRN: [id number] Date of Birth: [birth year] Today's Date: 12/25/2011  [id number] Pain: minimal ( 3/10) RT BKA/premedicated Focus of treatment: wc mobility training/endurance; transfer training; sit to stand to RW Treatment: wc mobility - pt propels wc approximately 75 feet x 2 (1/2 distance to or from gym) before c/o fatigue; transfers - level scoot to squat/pivot wc > mat vcs for setup + close SBA ; sit to stand from raised mat (24 ") mod/max assist with decreased use of RT Knee extension during sit to stand; standing to RW X 3 for 1 minutes min assist ; seated LAQs on right with 4#  2X 10; LT knee extension 2 X 10; transfer to unlevel height mat to wc ( approximately 1 ") scoot to squat/pivot min assist with difficulty.   [name],JIM 12/25/2011, 7:46 AM

## 2011-12-25 NOTE — Progress Notes (Signed)
Occupational Therapy Session Note  Patient Details  Name: Jamie Romero MRN: [id number] Date of Birth: 04/14/1922  Today's Date: 12/25/2011 Time: 4098-1191 and [id number] Time Calculation (min): 45 min and 45 minutes = 90 minutes  Skilled Therapeutic Interventions/Progress Updates: AM session:   ADL in w/c at sink with focus on bed to w/c transfer and attempts for sit to stand at sink or lateral leans in w/c for periarea bathing/dressing  PM session:  Toilet transfers squat pivot/side scoot pivot and attempts at sink to stand for pericleansing, donning brief and pulling up pants;  Also focused on UE strengthening to compensate for loss of R LE for safety, standing balance and transfers    Therapy Documentation Precautions:  Precautions Precautions: Fall Restrictions Weight Bearing Restrictions: Yes RLE Weight Bearing: Non weight bearing Pain:   denied See FIM for current functional status  Therapy/Group: Individual Therapy  Bud Face [facility] 12/25/2011, 3:07 PM

## 2011-12-25 NOTE — Progress Notes (Signed)
Physical Therapy Session Note  Patient Details  Name: Jamie Romero MRN: [id number] Date of Birth: 05-01-1922  Today's Date: 12/25/2011 Time: 4098-1191 Time Calculation (min): 57 min  Short Term Goals: Week 1:  PT Short Term Goal 1 (Week 1): Pt will be able to perform functional transfers with min A PT Short Term Goal 2 (Week 1): Pt will be able to complete sit to stand with mod A PT Short Term Goal 3 (Week 1): Pt will be able to prople w/c in controlled environment x 150' with S PT Short Term Goal 4 (Week 1): Pt will be able to complete HEP for LE ROM/STrengthenig with S  Skilled Therapeutic Interventions/Progress Updates:   Patient demonstrated squat scooting w/c > mat with poor ability to maintain L foot on floor to push through and required mod-max A to complete safe transfer to R to mat.  Performed sit > supine with min A.  Patient performed supine bilat LE strengthening and ROM exercises: 10-20 reps each SAQ, hip IR and ADD, isometric hip ABD against belt, glute and quad sets, open chain hip ABD, L ankle pumps,  L heel slides, bilat bridges with R thigh resting on bolster to ensure NWB through end of limb, attempted forward leans with LLE extension to come to squat position but unable with max A and 3 attempts; changed to UE tricep and lat extensions with push up blocks x 5 reps x 2 with mod A to maintain position of L foot and to facilitate increased forward lean of trunk to bring COG over BOS (patient attempts to extend trunk and keep COG posterior to BOS.)  Attempted to stand from mat with bilat UE support on back of w/c but unable with total A secondary to fatigue and LE weakness.  Returned to w/c squat scoots with max A.  W/c on unit x 100' with supervision with bilat UE propulsion for UE strengthening.  Fatigued quickly and therapist propelled w/c rest of way to room.   Therapy Documentation Precautions:  Precautions Precautions: Fall Restrictions Weight Bearing Restrictions:  Yes RLE Weight Bearing: Non weight bearing Pain:  No c/o pain   See FIM for current functional status  Therapy/Group: Individual Therapy  Edman Circle [facility] 12/25/2011, 3:33 PM

## 2011-12-26 ENCOUNTER — Inpatient Hospital Stay (HOSPITAL_COMMUNITY): Payer: Medicare Other | Admitting: *Deleted

## 2011-12-26 LAB — GLUCOSE, CAPILLARY
Glucose-Capillary: 80 mg/dL (ref 70–99)
Glucose-Capillary: 151 mg/dL — ABNORMAL HIGH (ref 70–99)

## 2011-12-26 NOTE — Progress Notes (Signed)
Occupational Therapy Session Note  Patient Details  Name: Jamie Romero MRN: [id number] Date of Birth: [birth year]  Today's Date: 12/26/2011 Time:  - 1500-1600  (60 min) Pain:  2/10  Individual therapy  Short Term Goals: Week 1:  OT Short Term Goal 1 (Week 1): Patient will perform UB dressing sitting edge of bed with min/steady assist OT Short Term Goal 2 (Week 1): Patient will perform LB dressing at bed level with moderate assistance OT Short Term Goal 3 (Week 1): Patient will perform toilet transfer with minimal assistance OT Short Term Goal 4 (Week 1): UE HEP will be implemented Week 2:     Skilled Therapeutic Interventions/Progress Updates:    Addressed transfers with lateral slide without sliding board, scooting down surface, sit to half stand.  Pt reports he has limited dorsiflexion on LLE PTA due to injury.  RLE was doing better than left before the amputation.  Addressed triceps , rhomboids, shoulders in EOB and supine exercises.  Pt. Tolerated well.  Used push up blocks to facilitate sit to half stand with min assist x5.      Precautions:  Precautions Precautions: Fall Restrictions Weight Bearing Restrictions: Yes RLE Weight Bearing: Non weight bearing   See FIM for current functional status  Therapy/Group: Individual Therapy  Jamie Romero 12/26/2011, 3:34 PM

## 2011-12-26 NOTE — Progress Notes (Signed)
Subjective/Complaints: No new problems   . A 12 point review of systems has been performed and if not noted above is otherwise negative.    Objective: Vital Signs: Blood pressure 115/56, pulse 62, temperature 97.8 F (36.6 C), temperature source Oral, resp. rate 20, height 5\' 6"  (1.676 m), weight 76 kg (167 lb 8.8 oz), SpO2 100.00%. No results found. No results found for this basename: WBC:2,HGB:2,HCT:2,PLT:2 in the last 72 hours No results found for this basename: NA:2,K:2,CL:2,CO2:2,GLUCOSE:2,BUN:2,CREATININE:2,CALCIUM:2 in the last 72 hours CBG (last 3)   Basename 12/25/11 2108 12/25/11 1629 12/25/11 1141  GLUCAP 134* 210* 196*    Wt Readings from Last 3 Encounters:  12/24/11 76 kg (167 lb 8.8 oz)  12/16/11 69.446 kg (153 lb 1.6 oz)  12/16/11 69.446 kg (153 lb 1.6 oz)    Physical Exam:  Constitutional: He is oriented to person, place, and time.  Pleasant thin elderly male, NAD.  HENT:  Head: Normocephalic and atraumatic.  edentulous  Eyes: Pupils are equal, round, and reactive to light.  Neck: Normal range of motion.  Cardiovascular: Normal rate and regular rhythm.  Pulmonary/Chest: Effort normal and breath sounds normal.  Abdominal: Soft. Bowel sounds are normal. He exhibits no distension. There is no tenderness.  Musculoskeletal: He exhibits edema (2+ left pedal.).  R_BKA with compressive dressing in place.  Neurological: He is alert and oriented to person, place, and time.  Decreased sensation left foot/leg in a stocking glove distribution.. Left foot drop and weakness with eversion as well. LLE is grossly 4/5 except for affected areas which are grossly 1/5.  Skin: Skin is warm and dry. Rash gradually improving. Incision in tact   Assessment/Plan: 1. Functional deficits secondary to right BKA  which require 3+ hours per day of interdisciplinary therapy in a comprehensive inpatient rehab setting. Physiatrist is providing close team supervision and 24 hour  management of active medical problems listed below. Physiatrist and rehab team continue to assess barriers to discharge/monitor patient progress toward functional and medical goals. FIM: FIM - Bathing Bathing Steps Patient Completed: Chest;Right Arm;Left Arm;Abdomen;Front perineal area;Right upper leg;Left upper leg;Left lower leg (including foot) (lateral leans for buttocks as pt unable to maintainstandbala) Bathing: 4: Min-Patient completes 8-9 12f 10 parts or 75+ percent  FIM - Upper Body Dressing/Undressing Upper body dressing/undressing steps patient completed: Thread/unthread right sleeve of pullover shirt/dresss;Thread/unthread left sleeve of pullover shirt/dress;Put head through opening of pull over shirt/dress;Pull shirt over trunk Upper body dressing/undressing: 5: Set-up assist to: Obtain clothing/put away FIM - Lower Body Dressing/Undressing Lower body dressing/undressing steps patient completed: Thread/unthread right pants leg;Thread/unthread left pants leg;Fasten/unfasten pants;Don/Doff left sock (wore brief, pants and tredded left sock) Lower body dressing/undressing: 4: Min-Patient completed 75 plus % of tasks  FIM - Toileting Toileting: 1: Total-Patient completed zero steps, helper did all 3  FIM - Diplomatic Services operational officer Devices: Sliding board;Grab bars Toilet Transfers: 4-To toilet/BSC: Min A (steadying Pt. > 75%);4-From toilet/BSC: Min A (steadying Pt. > 75%)  FIM - Bed/Chair Transfer Bed/Chair Transfer Assistive Devices: Arm rests Bed/Chair Transfer: 5: Supine > Sit: Supervision (verbal cues/safety issues);5: Sit > Supine: Supervision (verbal cues/safety issues);2: Bed > Chair or W/C: Max A (lift and lower assist);2: Chair or W/C > Bed: Max A (lift and lower assist)  FIM - Locomotion: Wheelchair Distance: 100 Locomotion: Wheelchair: 2: Travels 50 - 149 ft with supervision, cueing or coaxing FIM - Locomotion: Ambulation Locomotion: Ambulation  Assistive Devices: Parallel bars Ambulation/Gait Assistance: 3: Mod assist Locomotion: Ambulation: 0: Activity  did not occur  Comprehension Comprehension Mode: Auditory Comprehension: 7-Follows complex conversation/direction: With no assist  Expression Expression Mode: Verbal Expression: 7-Expresses complex ideas: With no assist  Social Interaction Social Interaction: 6-Interacts appropriately with others with medication or extra time (anti-anxiety, antidepressant).  Problem Solving Problem Solving: 5-Solves complex 90% of the time/cues < 10% of the time  Memory Memory: 7-Complete Independence: No helper  Medical Problem List and Plan:  1. DVT Prophylaxis/Anticoagulation: Pharmaceutical: Lovenox  2. Pain Management: Continue hydrocodone prn  3. Mood: team will provide egosupport as appropriate  4. Neuropsych: This patient is capable of making decisions on his/her own behalf.  5. DM type 2: Continue AC/HS cbg checks to keep BS controlled and help wound healing. Sugars normalizing with less am hypoglycemia 6. HTN: Monitor with bid checks. Continue terazosin, ranexa, lasix, and digoxin. Lisinopril discontinued due to low BP.  7. GERD: On protonix.  8. Iron deficiency anemia: continue iron supplements. hgb around 9 currently- recheck monday 9. Hyponatremia: resolving 10. H/o angina: continue ranexa.  11. Dermatitis: hydrocortisone cream.  LOS (Days) 6 A FACE TO FACE EVALUATION WAS PERFORMED  Cornelis Kluver T 12/26/2011, 8:50 AM

## 2011-12-27 ENCOUNTER — Inpatient Hospital Stay (HOSPITAL_COMMUNITY): Payer: Medicare Other | Admitting: Occupational Therapy

## 2011-12-27 ENCOUNTER — Inpatient Hospital Stay (HOSPITAL_COMMUNITY): Payer: Medicare Other

## 2011-12-27 DIAGNOSIS — S88119A Complete traumatic amputation at level between knee and ankle, unspecified lower leg, initial encounter: Secondary | ICD-10-CM

## 2011-12-27 DIAGNOSIS — L98499 Non-pressure chronic ulcer of skin of other sites with unspecified severity: Secondary | ICD-10-CM

## 2011-12-27 DIAGNOSIS — Z5189 Encounter for other specified aftercare: Secondary | ICD-10-CM

## 2011-12-27 DIAGNOSIS — D62 Acute posthemorrhagic anemia: Secondary | ICD-10-CM

## 2011-12-27 DIAGNOSIS — I739 Peripheral vascular disease, unspecified: Secondary | ICD-10-CM

## 2011-12-27 LAB — GLUCOSE, CAPILLARY: Glucose-Capillary: 159 mg/dL — ABNORMAL HIGH (ref 70–99)

## 2011-12-27 NOTE — Progress Notes (Signed)
Nutrition Follow-up  Intervention:  Discontinue Ensure Pudding po TID. Pt is eating well at this time, continue current interventions.  Assessment:   RN reports that pt is refusing Ensure Pudding. Pt is now consuming 100% of meals.  Diet Order:  Dysphagia 3 with thin liquids Supplements: Ensure Pudding PO TID; snacks in between meals  Meds: Scheduled Meds:   . aspirin  81 mg Oral Daily  . atorvastatin  10 mg Oral Daily  . digoxin  125 mcg Oral Daily  . docusate sodium  200 mg Oral QHS  . enoxaparin  40 mg Subcutaneous Q24H  . feeding supplement  1 Container Oral TID BM  . ferrous sulfate  325 mg Oral BID  . furosemide  20 mg Oral Daily  . glipiZIDE  5 mg Oral Daily  . insulin aspart  0-9 Units Subcutaneous TID WC  . multivitamin with minerals  1 tablet Oral Daily  . pantoprazole  40 mg Oral BID AC  . ranolazine  500 mg Oral BID  . terazosin  10 mg Oral QHS  . traZODone  25 mg Oral QHS  . vitamin C  500 mg Oral Daily  . vitamin E  400 Units Oral Daily  . zinc sulfate  220 mg Oral Daily   Continuous Infusions:  PRN Meds:.acetaminophen, alum & mag hydroxide-simeth, bisacodyl, diphenhydrAMINE, guaiFENesin-dextromethorphan, HYDROcodone-acetaminophen, methocarbamol, polyethylene glycol, prochlorperazine, prochlorperazine, prochlorperazine  Labs:  CMP     Component Value Date/Time   NA 136 12/21/2011 0601   K 4.3 12/21/2011 0601   CL 106 12/21/2011 0601   CO2 26 12/21/2011 0601   GLUCOSE 55* 12/21/2011 0601   GLUCOSE 115* 03/04/2006 1152   BUN 20 12/21/2011 0601   CREATININE 1.09 12/21/2011 0601   CALCIUM 7.6* 12/21/2011 0601   PROT 4.8* 12/21/2011 0601   ALBUMIN 1.5* 12/21/2011 0601   AST 27 12/21/2011 0601   ALT 14 12/21/2011 0601   ALKPHOS 126* 12/21/2011 0601   BILITOT 0.5 12/21/2011 0601   GFRNONAA 59* 12/21/2011 0601   GFRAA 68* 12/21/2011 0601   CBG (last 3)   Basename 12/27/11 0715 12/26/11 2058 12/26/11 1610  GLUCAP 95 204* 140*    Intake/Output Summary (Last 24 hours)  at 12/27/11 1016 Last data filed at 12/27/11 0800  Gross per 24 hour  Intake   1525 ml  Output    300 ml  Net   1225 ml  BM 10/13  Weight Status:  167 lb - stable (usual wt is 170 lb)  Body mass index is 27.01 kg/(m^2). Overweight  Re-estimated needs:  1900 - 2080 kcal, 82 - 96 grams protein  Nutrition Dx:  Increased nutrient needs r/t healing AEB recent BKA. Ongoing.  Goal:  Pt to meet >/= 90% of their estimated nutrition needs - met.  Monitor:  weight trends, lab trends, I/O's, PO intake, supplement tolerance  Jarold Motto MS, RD, LDN Pager: [phone] After-hours pager: [phone]

## 2011-12-27 NOTE — Progress Notes (Signed)
Physical Therapy Session Note  Patient Details  Name: Jamie Romero MRN: [id number] Date of Birth: 21-Jun-1922  Today's Date: 12/27/2011 Time: 1015-1110 Time Calculation (min): 55 min  Short Term Goals: Week 1:  PT Short Term Goal 1 (Week 1): Pt will be able to perform functional transfers with min A PT Short Term Goal 2 (Week 1): Pt will be able to complete sit to stand with mod A PT Short Term Goal 3 (Week 1): Pt will be able to prople w/c in controlled environment x 150' with S PT Short Term Goal 4 (Week 1): Pt will be able to complete HEP for LE ROM/STrengthenig with S  Skilled Therapeutic Interventions/Progress Updates:    Treatment focused on functional transfers using slideboard (recommending this at this time for safety and increased independence especially since pt fatigues very easily and quickly becomes total A if fatigued). Pt able to complete even and slightly uphill transfer w/c <-> mat with steady A, cueing to push through LLE to aid with transfer and lift up through bottom. Practice lateral scooting with emphasis on lifting bottom off of mat to improve transfer technique. Also discussed options for bed at home due to pt reporting it being a very high bed. Will also need to address with family but pt thinks may be able to use a different bed that is level with the w/c. Supine modified bridging and mini crunches to increase strength and assist with transfers and bed mobility. Using momentum for bed mobility, pt able to complete with S. W/c propulsion on unit with S for overall endurance and strengthening to/from therapy gym.  Therapy Documentation Precautions:  Precautions Precautions: Fall Restrictions Weight Bearing Restrictions: No RLE Weight Bearing: Non weight bearing Pain: Pain Assessment Pain Assessment: No/denies pain  See FIM for current functional status  Therapy/Group: Individual Therapy  [name] [facility] 12/27/2011, 12:05 PM

## 2011-12-27 NOTE — Progress Notes (Signed)
Physical Therapy Session Note  Patient Details  Name: Jamie Romero MRN: [id number] Date of Birth: Feb 03, 1923  Today's Date: 12/27/2011 Time: 1540-1610 Time Calculation (min): 30 min  Short Term Goals: Week 1:  PT Short Term Goal 1 (Week 1): Pt will be able to perform functional transfers with min A PT Short Term Goal 2 (Week 1): Pt will be able to complete sit to stand with mod A PT Short Term Goal 3 (Week 1): Pt will be able to prople w/c in controlled environment x 150' with S PT Short Term Goal 4 (Week 1): Pt will be able to complete HEP for LE ROM/STrengthenig with S  Skilled Therapeutic Interventions/Progress Updates:    Treatment focused on functional transfers with slideboard managing w/c parts with transfers on uneven surfaces and to elevated toilet seat (pt overall steady A with all transfers). Seated edge of mat completed PNF diagonals with yellow weighted medicine ball and trunk rotation x 10 reps each direction x 3 sets for functional strengthening.   Therapy Documentation Precautions:  Precautions Precautions: Fall Restrictions Weight Bearing Restrictions: No RLE Weight Bearing: Non weight bearing   Pain:  Denies pain.  See FIM for current functional status  Therapy/Group: Individual Therapy  [name] [facility] 12/27/2011, 4:15 PM

## 2011-12-27 NOTE — Progress Notes (Signed)
Subjective/Complaints: No new problems. Slept well. Pain under control   . A 12 point review of systems has been performed and if not noted above is otherwise negative.    Objective: Vital Signs: Blood pressure 114/55, pulse 69, temperature 98.1 F (36.7 C), temperature source Oral, resp. rate 18, height 5\' 6"  (1.676 m), weight 75.9 kg (167 lb 5.3 oz), SpO2 96.00%. No results found. No results found for this basename: WBC:2,HGB:2,HCT:2,PLT:2 in the last 72 hours No results found for this basename: NA:2,K:2,CL:2,CO2:2,GLUCOSE:2,BUN:2,CREATININE:2,CALCIUM:2 in the last 72 hours CBG (last 3)   Basename 12/27/11 0715 12/26/11 2058 12/26/11 1610  GLUCAP 95 204* 140*    Wt Readings from Last 3 Encounters:  12/27/11 75.9 kg (167 lb 5.3 oz)  12/16/11 69.446 kg (153 lb 1.6 oz)  12/16/11 69.446 kg (153 lb 1.6 oz)    Physical Exam:  Constitutional: He is oriented to person, place, and time.  Pleasant thin elderly male, NAD.  HENT:  Head: Normocephalic and atraumatic.  edentulous  Eyes: Pupils are equal, round, and reactive to light.  Neck: Normal range of motion.  Cardiovascular: Normal rate and regular rhythm.  Pulmonary/Chest: Effort normal and breath sounds normal.  Abdominal: Soft. Bowel sounds are normal. He exhibits no distension. There is no tenderness.  Musculoskeletal: He exhibits edema (2+ left pedal.).  R_BKA with compressive dressing in place.  Neurological: He is alert and oriented to person, place, and time.  Decreased sensation left foot/leg in a stocking glove distribution.. Left foot drop and weakness with eversion as well. LLE is grossly 4/5 except for affected areas which are grossly 1/5.  Skin: Skin is warm and dry. Rash gradually improving. Incision in tactwith minimal drainage   Assessment/Plan: 1. Functional deficits secondary to right BKA  which require 3+ hours per day of interdisciplinary therapy in a comprehensive inpatient rehab setting. Physiatrist is  providing close team supervision and 24 hour management of active medical problems listed below. Physiatrist and rehab team continue to assess barriers to discharge/monitor patient progress toward functional and medical goals. FIM: FIM - Bathing Bathing Steps Patient Completed: Right Arm;Chest;Left Arm;Abdomen;Front perineal area Bathing: 3: Mod-Patient completes 5-7 64f 10 parts or 50-74%  FIM - Upper Body Dressing/Undressing Upper body dressing/undressing steps patient completed: Thread/unthread right sleeve of front closure shirt/dress;Thread/unthread left sleeve of front closure shirt/dress;Pull shirt around back of front closure shirt/dress Upper body dressing/undressing: 4: Min-Patient completed 75 plus % of tasks FIM - Lower Body Dressing/Undressing Lower body dressing/undressing steps patient completed: Thread/unthread right pants leg;Thread/unthread left pants leg Lower body dressing/undressing: 4: Min-Patient completed 75 plus % of tasks  FIM - Toileting Toileting: 0: Activity did not occur  FIM - Diplomatic Services operational officer Devices: Sliding board;Grab bars Toilet Transfers: 0-Activity did not occur  FIM - Banker Devices: Arm rests Bed/Chair Transfer: 3: Bed > Chair or W/C: Mod A (lift or lower assist);3: Chair or W/C > Bed: Mod A (lift or lower assist)  FIM - Locomotion: Wheelchair Distance: 100 Locomotion: Wheelchair: 2: Travels 50 - 149 ft with supervision, cueing or coaxing FIM - Locomotion: Ambulation Locomotion: Ambulation Assistive Devices: Parallel bars Ambulation/Gait Assistance: 3: Mod assist Locomotion: Ambulation: 0: Activity did not occur  Comprehension Comprehension Mode: Auditory Comprehension: 7-Follows complex conversation/direction: With no assist  Expression Expression Mode: Verbal Expression: 7-Expresses complex ideas: With no assist  Social Interaction Social Interaction: 7-Interacts  appropriately with others - No medications needed.  Problem Solving Problem Solving: 5-Solves basic 90% of the  time/requires cueing < 10% of the time  Memory Memory: 6-More than reasonable amt of time  Medical Problem List and Plan:  1. DVT Prophylaxis/Anticoagulation: Pharmaceutical: Lovenox  2. Pain Management: Continue hydrocodone prn  3. Mood: team will provide egosupport as appropriate  4. Neuropsych: This patient is capable of making decisions on his/her own behalf.  5. DM type 2: Continue AC/HS cbg checks to keep BS controlled and help wound healing. Sugars normalizing with less am hypoglycemia--monitor for now 6. HTN: Monitor with bid checks. Continue terazosin, ranexa, lasix, and digoxin. Lisinopril discontinued due to low BP.  7. GERD: On protonix.  8. Iron deficiency anemia: continue iron supplements. hgb around 9 currently- recheck tomorrow 9. Hyponatremia: resolving 10. H/o angina: continue ranexa.  11. Dermatitis: hydrocortisone cream.  LOS (Days) 7 A FACE TO FACE EVALUATION WAS PERFORMED  Marthena Whitmyer T 12/27/2011, 8:37 AM

## 2011-12-27 NOTE — Progress Notes (Signed)
Occupational Therapy Session Note  Patient Details  Name: Jamie Romero MRN: [id number] Date of Birth: [birth year]  Today's Date: 12/27/2011 Time: 0803-0900 Time Calculation (min): 57 min  Short Term Goals: Week 1:  OT Short Term Goal 1 (Week 1): Patient will perform UB dressing sitting edge of bed with min/steady assist OT Short Term Goal 2 (Week 1): Patient will perform LB dressing at bed level with moderate assistance OT Short Term Goal 3 (Week 1): Patient will perform toilet transfer with minimal assistance OT Short Term Goal 4 (Week 1): UE HEP will be implemented  Skilled Therapeutic Interventions/Progress Updates:    Bathing and dressing session in sitting EOB.  Pt able to perform lateral leans side to side with min assist when washing peri area or pulling pants over hips.  Able to bring his LLE up and cross over the right for dressing tasks to donn his pants and shoe.  Performed scooting pivot transfer to wheelchair with mod assist without use of sliding board.  Needed to increase height of the hospital bed to even it with the surface of the wheelchair.  Finished grooming activities at the sink from the wheelchair.  Therapy Documentation Precautions:  Precautions Precautions: Fall Restrictions Weight Bearing Restrictions: No RLE Weight Bearing: Non weight bearing  Pain: Pain Assessment Pain Assessment: No/denies pain ADL: See FIM for current functional status  Therapy/Group: Individual Therapy  Triniti Gruetzmacher OTR/L 12/27/2011, 9:09 AM

## 2011-12-27 NOTE — Progress Notes (Signed)
Occupational Therapy Session Note  Patient Details  Name: Jamie Romero MRN: [id number] Date of Birth: 11-19-1922  Today's Date: 12/27/2011 Time: 1345-1430 Time Calculation (min): 45 min  Short Term Goals: Week 1:  OT Short Term Goal 1 (Week 1): Patient will perform UB dressing sitting edge of bed with min/steady assist OT Short Term Goal 2 (Week 1): Patient will perform LB dressing at bed level with moderate assistance OT Short Term Goal 3 (Week 1): Patient will perform toilet transfer with minimal assistance OT Short Term Goal 4 (Week 1): UE HEP will be implemented  Skilled Therapeutic Interventions/Progress Updates:  Patient found seated in w/c beside bed. Therapist requested patient propel outside of room with supervision, patient just required min verbal cues secondary to environmental obstacles (pillow in floor). Patient propelled self from room -> therapy gym with supervision and transferred onto therapy mat with moderate assistance (squat pivot transfer). Patient then engaged in various UE exercises using tricep/push blocks, weighted ball, and  AROM in supine position. Patient then requested to transfer back to w/c using slide board secondary to being tired. Patient able to transfer back with steady/min assist. Therapist then propelled patient back to room and left him seated in w/c with call bell & phone within reach.   Precautions:  Precautions Precautions: Fall Restrictions Weight Bearing Restrictions: No RLE Weight Bearing: Non weight bearing  See FIM for current functional status  Therapy/Group: Individual Therapy  Rose Hippler 12/27/2011, 3:11 PM

## 2011-12-28 ENCOUNTER — Inpatient Hospital Stay (HOSPITAL_COMMUNITY): Payer: Medicare Other

## 2011-12-28 ENCOUNTER — Inpatient Hospital Stay (HOSPITAL_COMMUNITY): Payer: Medicare Other | Admitting: Occupational Therapy

## 2011-12-28 DIAGNOSIS — S88119A Complete traumatic amputation at level between knee and ankle, unspecified lower leg, initial encounter: Secondary | ICD-10-CM

## 2011-12-28 DIAGNOSIS — Z5189 Encounter for other specified aftercare: Secondary | ICD-10-CM

## 2011-12-28 DIAGNOSIS — L98499 Non-pressure chronic ulcer of skin of other sites with unspecified severity: Secondary | ICD-10-CM

## 2011-12-28 DIAGNOSIS — I739 Peripheral vascular disease, unspecified: Secondary | ICD-10-CM

## 2011-12-28 DIAGNOSIS — D62 Acute posthemorrhagic anemia: Secondary | ICD-10-CM

## 2011-12-28 LAB — GLUCOSE, CAPILLARY
Glucose-Capillary: 83 mg/dL (ref 70–99)
Glucose-Capillary: 218 mg/dL — ABNORMAL HIGH (ref 70–99)
Glucose-Capillary: 89 mg/dL (ref 70–99)

## 2011-12-28 LAB — CBC
HCT: 25 % — ABNORMAL LOW (ref 39.0–52.0)
MCHC: 32.4 g/dL (ref 30.0–36.0)
Platelets: 106 10*3/uL — ABNORMAL LOW (ref 150–400)
RDW: 18.4 % — ABNORMAL HIGH (ref 11.5–15.5)
WBC: 4.4 10*3/uL (ref 4.0–10.5)

## 2011-12-28 NOTE — Progress Notes (Signed)
Subjective/Complaints: No new problems. Slept well. Pain under control   . A 12 point review of systems has been performed and if not noted above is otherwise negative.    Objective: Vital Signs: Blood pressure 148/62, pulse 66, temperature 98.2 F (36.8 C), temperature source Oral, resp. rate 19, height 5\' 6"  (1.676 m), weight 75.9 kg (167 lb 5.3 oz), SpO2 97.00%. No results found.  Basename 12/28/11 0625  WBC 4.4  HGB 8.1*  HCT 25.0*  PLT PENDING   No results found for this basename: NA:2,K:2,CL:2,CO2:2,GLUCOSE:2,BUN:2,CREATININE:2,CALCIUM:2 in the last 72 hours CBG (last 3)   Basename 12/28/11 0710 12/27/11 2028 12/27/11 1634  GLUCAP 83 154* 136*    Wt Readings from Last 3 Encounters:  12/27/11 75.9 kg (167 lb 5.3 oz)  12/16/11 69.446 kg (153 lb 1.6 oz)  12/16/11 69.446 kg (153 lb 1.6 oz)    Physical Exam:  Constitutional: He is oriented to person, place, and time.  Pleasant thin elderly male, NAD.  HENT:  Head: Normocephalic and atraumatic.  edentulous  Eyes: Pupils are equal, round, and reactive to light.  Neck: Normal range of motion.  Cardiovascular: Normal rate and regular rhythm.  Pulmonary/Chest: Effort normal and breath sounds normal.  Abdominal: Soft. Bowel sounds are normal. He exhibits no distension. There is no tenderness.  Musculoskeletal: He exhibits edema (2+ left pedal.).  R_BKA with compressive dressing in place.  Neurological: He is alert and oriented to person, place, and time.  Decreased sensation left foot/leg in a stocking glove distribution.. Left foot drop and weakness with eversion as well. LLE is grossly 4/5 except for affected areas which are grossly 1/5.  Skin: Skin is warm and dry. Rash gradually improving. Incision in tactwith minimal drainage   Assessment/Plan: 1. Functional deficits secondary to right BKA  which require 3+ hours per day of interdisciplinary therapy in a comprehensive inpatient rehab setting. Physiatrist is  providing close team supervision and 24 hour management of active medical problems listed below. Physiatrist and rehab team continue to assess barriers to discharge/monitor patient progress toward functional and medical goals. FIM: FIM - Bathing Bathing Steps Patient Completed: Chest;Right Arm;Left Arm;Abdomen;Front perineal area;Buttocks;Right upper leg;Left upper leg;Left lower leg (including foot) Bathing: 4: Min-Patient completes 8-9 60f 10 parts or 75+ percent  FIM - Upper Body Dressing/Undressing Upper body dressing/undressing steps patient completed: Thread/unthread right sleeve of front closure shirt/dress;Thread/unthread left sleeve of front closure shirt/dress;Pull shirt around back of front closure shirt/dress;Button/unbutton shirt Upper body dressing/undressing: 5: Set-up assist to: Obtain clothing/put away FIM - Lower Body Dressing/Undressing Lower body dressing/undressing steps patient completed: Thread/unthread right pants leg;Thread/unthread left pants leg;Don/Doff left shoe Lower body dressing/undressing: 4: Min-Patient completed 75 plus % of tasks  FIM - Toileting Toileting: 0: Activity did not occur  FIM - Diplomatic Services operational officer Devices: Sliding board;Grab bars Toilet Transfers: 0-Activity did not occur  FIM - Architectural technologist Transfer: 5: Supine > Sit: Supervision (verbal cues/safety issues);5: Bed > Chair or W/C: Supervision (verbal cues/safety issues);4: Bed > Chair or W/C: Min A (steadying Pt. > 75%);4: Chair or W/C > Bed: Min A (steadying Pt. > 75%)  FIM - Locomotion: Wheelchair Distance: 100 Locomotion: Wheelchair: 5: Travels 150 ft or more: maneuvers on rugs and over door sills with supervision, cueing or coaxing FIM - Locomotion: Ambulation Locomotion: Ambulation Assistive Devices: Parallel bars Ambulation/Gait Assistance: 3: Mod assist Locomotion: Ambulation: 0: Activity did  not occur  Comprehension Comprehension Mode: Auditory Comprehension: 5-Understands  complex 90% of the time/Cues < 10% of the time  Expression Expression Mode: Verbal Expression: 7-Expresses complex ideas: With no assist  Social Interaction Social Interaction: 7-Interacts appropriately with others - No medications needed.  Problem Solving Problem Solving: 5-Solves complex 90% of the time/cues < 10% of the time  Memory Memory: 6-More than reasonable amt of time  Medical Problem List and Plan:  1. DVT Prophylaxis/Anticoagulation: Pharmaceutical: Lovenox  2. Pain Management: Continue hydrocodone prn  3. Mood: team will provide egosupport as appropriate  4. Neuropsych: This patient is capable of making decisions on his/her own behalf.  5. DM type 2: Continue AC/HS cbg checks to keep BS controlled and help wound healing. Sugars normalizing with less am hypoglycemia--monitor for now 6. HTN: Monitor with bid checks. Continue terazosin, ranexa, lasix, and digoxin. Lisinopril discontinued due to low BP.  7. GERD: On protonix.  8. Iron deficiency anemia: continue iron supplements. hgb around down to 8.1---heme check stools  Recheck tomorrow 9. Hyponatremia: resolving 10. H/o angina: continue ranexa.  11. Dermatitis: hydrocortisone cream.  LOS (Days) 8 A FACE TO FACE EVALUATION WAS PERFORMED  Lavender Stanke T 12/28/2011, 7:24 AM

## 2011-12-28 NOTE — Progress Notes (Signed)
Social Work Patient ID: Jamie Romero, male   DOB: Mar 28, 1922, 78 y.o.   MRN: [id number]  Have reviewed team conference with patient and have left voice mail for daughter-in-law to schedule family education and request that family bring in patient's wheelchair to be evaluated.  Pt is aware that targeted d/c has been moved to 01/03/12 after therapies.  Hope to meet with family tomorrow during family ed time.  Heinz Eckert

## 2011-12-28 NOTE — Progress Notes (Signed)
Occupational Therapy Weekly Progress Note & Session Notes  Patient Details  Name: MARKEY DEADY MRN: [id number] Date of Birth: [birth year]  Today's Date: 12/28/2011  WEEKLY PROGRESS NOTE Patient has met 3 of 4 short term goals.  Patient is making steady progress on CIR. Patient currently functioning at an overall supervision -> min assist level for BADLs, except patient requires moderate assistance for toilet transfers. Patient did not meet STG of min assist for toilet transfers. Patient is slightly inconsistent with squat pivot transfers, but is able to verbalize need for slide board if he feels he is fatigued/fatiguing. Over the next week family education does need to be initiated.   Patient continues to demonstrate the following deficits: decreased independence with BADLs, decreased overall activity tolerance/endurance, decreased overall functional strength, decreased independence with BADL transfers, decreased independence with functional mobility. Therefore, patient will continue to benefit from skilled OT intervention to enhance overall performance with BADL and Reduce care partner burden.  Patient progressing toward long term goals..  Continue plan of care.  OT Short Term Goals Week 1:  OT Short Term Goal 1 (Week 1): Patient will perform UB dressing sitting edge of bed with min/steady assist OT Short Term Goal 1 - Progress (Week 1): Met OT Short Term Goal 2 (Week 1): Patient will perform LB dressing at bed level with moderate assistance OT Short Term Goal 2 - Progress (Week 1): Met OT Short Term Goal 3 (Week 1): Patient will perform toilet transfer with minimal assistance OT Short Term Goal 3 - Progress (Week 1): Progressing toward goal OT Short Term Goal 4 (Week 1): UE HEP will be implemented OT Short Term Goal 4 - Progress (Week 1): Met  Week 2:  OT Short Term Goal 1 (Week 2): Short Term Goals = Long Term Goals  Skilled Therapeutic Interventions/Progress Updates:    Balance/vestibular training;Community reintegration;Discharge planning;DME/adaptive equipment instruction;Functional mobility training;Neuromuscular re-education;Pain management;Patient/family education;Psychosocial support;Self Care/advanced ADL retraining;Splinting/orthotics;Skin care/wound managment;Therapeutic Exercise;Therapeutic Activities;UE/LE Strength taining/ROM;UE/LE Coordination activities;Wheelchair propulsion/positioning   Precautions:  Precautions Precautions: Fall Restrictions Weight Bearing Restrictions: Yes RLE Weight Bearing: Non weight bearing  See FIM for current functional status  -----------------------------------------------------------------------------------------------------------------------  SESSION NOTES  Session #1 [id number] - 55 Minutes Individual Therapy No complaints of pain Patient found supine in bed. Patient verbalized need to use restroom. Patient engaged in bed mobility and sat edge of bed for squat pivot transfer onto drop arm BSC with moderate assistance from therapist. After patient used restroom, patient sat on BSC and engaged in UB/LB bathing & dressing (performing lateral leans to pull pants up to waist). Worked on patient sitting up in chair for BADL, but recommend patient complete ADL at bed level when at home to increase overall independence. Patient transferred into w/c from [facility] after ADL using slide board with minimal assistance. Patient then sat at sink in w/c to brush hair. At end of session left patient seated in w/c beside bed with call bell & phone within reach.   Session #2 [id number] - 30 Minutes Individual Therapy No complaints of pain Treatment emphasis on therapeutic exercise focusing on w/c mobility throughout hospital hallways and outside on uneven surfaces, overall activity tolerance/endurance, and overall functional strength. At end of session patient left seated in w/c beside bed with call bell & phone within reach.    Bee Marchiano 12/28/2011, 11:20 AM

## 2011-12-28 NOTE — Progress Notes (Signed)
Physical Therapy Session Note  Patient Details  Name: Jamie Romero MRN: [id number] Date of Birth: 05/31/1922  Today's Date: 12/28/2011 Time: 4098-1191 Time Calculation (min): 45 min  Short Term Goals: Week 1:  PT Short Term Goal 1 (Week 1): Pt will be able to perform functional transfers with min A PT Short Term Goal 2 (Week 1): Pt will be able to complete sit to stand with mod A PT Short Term Goal 3 (Week 1): Pt will be able to prople w/c in controlled environment x 150' with S PT Short Term Goal 4 (Week 1): Pt will be able to complete HEP for LE ROM/STrengthenig with S  Skilled Therapeutic Interventions/Progress Updates:    Treatment focused on functional transfers from various surfaces with overall steady A (foot blocked in front of LLE to prevent it from slipping on tile) using slide board. Simulated car transfer with min A overall and discussed differences in real car transfer. Wii Tennis for dynamic sitting balance edge of mat and UE activity for endurance. W/c propulsion through obstacle course to simulate home environment with S and then picking up cones off of floor from w/c level.  Therapy Documentation Precautions:  Precautions Precautions: Fall Restrictions Weight Bearing Restrictions: Yes RLE Weight Bearing: Non weight bearing   Pain:  Denies pain.  See FIM for current functional status  Therapy/Group: Individual Therapy  [name] [facility] 12/28/2011, 4:41 PM

## 2011-12-28 NOTE — Progress Notes (Signed)
Physical Therapy Session Note  Patient Details  Name: Jamie Romero MRN: [id number] Date of Birth: [birth year]  Today's Date: 12/28/2011 Time: 1300-1355 Time Calculation (min): 55 min  Short Term Goals: Week 1:  PT Short Term Goal 1 (Week 1): Pt will be able to perform functional transfers with min A PT Short Term Goal 2 (Week 1): Pt will be able to complete sit to stand with mod A PT Short Term Goal 3 (Week 1): Pt will be able to prople w/c in controlled environment x 150' with S PT Short Term Goal 4 (Week 1): Pt will be able to complete HEP for LE ROM/STrengthenig with S  Skilled Therapeutic Interventions/Progress Updates:    Bed mobility with S to sit EOB and required mod A for slideboard transfer due to decreased WB through LLE and increased fatigue per pt report. W/c propulsion on unit with S and extra time for endurance and strengthening to/from therapy. Slide board transfers on level surface w/c <-> mat with steady A. Sit to stands with RW and elevated surface with mod A but unable to maintain longer than greater than 30 seconds, L knee unstable. Supine therex for functional strengthening to aid with transfers with 2# ankle weight on LLE. Completed exercises on both sides for SAQ, quad sets, hip flexion, and modified bridging x 2 sets of 10 reps each LE. Discussed again with pt changing his bed at home due to height and having family bring in w/c from home as well - pt in agreement.   Therapy Documentation Precautions:  Precautions Precautions: Fall Restrictions Weight Bearing Restrictions: Yes RLE Weight Bearing: Non weight bearing Pain:  Denies pain.  See FIM for current functional status  Therapy/Group: Individual Therapy  [name] [facility] 12/28/2011, 1:58 PM

## 2011-12-29 ENCOUNTER — Inpatient Hospital Stay (HOSPITAL_COMMUNITY): Payer: Medicare Other

## 2011-12-29 LAB — BASIC METABOLIC PANEL
CO2: 25 mEq/L (ref 19–32)
Chloride: 106 mEq/L (ref 96–112)
GFR calc Af Amer: 88 mL/min — ABNORMAL LOW (ref >90)
Potassium: 4.8 mEq/L (ref 3.5–5.1)

## 2011-12-29 LAB — GLUCOSE, CAPILLARY
Glucose-Capillary: 83 mg/dL (ref 70–99)
Glucose-Capillary: 121 mg/dL — ABNORMAL HIGH (ref 70–99)
Glucose-Capillary: 77 mg/dL (ref 70–99)

## 2011-12-29 LAB — CBC
HCT: 25.4 % — ABNORMAL LOW (ref 39.0–52.0)
MCV: 83.6 fL (ref 78.0–100.0)
Platelets: 108 10*3/uL — ABNORMAL LOW (ref 150–400)
RBC: 3.04 MIL/uL — ABNORMAL LOW (ref 4.22–5.81)
RDW: 18.7 % — ABNORMAL HIGH (ref 11.5–15.5)
WBC: 3.7 10*3/uL — ABNORMAL LOW (ref 4.0–10.5)

## 2011-12-29 NOTE — Progress Notes (Signed)
Physical Therapy Weekly Progress Note  Patient Details  Name: Jamie Romero MRN: [id number] Date of Birth: 1922/10/16  Today's Date: 12/29/2011 Time: 4098-1191 Time Calculation (min): 60 min Individual therapy Denies pain. Focused treatment on bed mobility, sliding board transfers (recommend using dysum under L foot to prevent it from sliding) including board and w/c parts management, w/c propulsion on unit for endurance and strengthening, sit to stands and gait in the parallel bars with mod A multiple reps with emphasis on scapular pattern (noted L knee varus in standing). Push up block push ups to increased strength x 10 reps x 2 sets to aid with transfers.   Patient has met 4 of 4 short term goals.  Pt is making good progress on CIR and continues to work hard. Planning for family education to occur this week.  Patient continues to demonstrate the following deficits: impaired balance, decreased strength, difficulty with gait, decreased endurance and therefore will continue to benefit from skilled PT intervention to enhance overall performance with activity tolerance and balance.  Patient progressing toward long term goals..  Continue plan of care.  PT Short Term Goals Week 1:  PT Short Term Goal 1 (Week 1): Pt will be able to perform functional transfers with min A PT Short Term Goal 1 - Progress (Week 1): Met PT Short Term Goal 2 (Week 1): Pt will be able to complete sit to stand with mod A PT Short Term Goal 2 - Progress (Week 1): Met PT Short Term Goal 3 (Week 1): Pt will be able to prople w/c in controlled environment x 150' with S PT Short Term Goal 3 - Progress (Week 1): Met PT Short Term Goal 4 (Week 1): Pt will be able to complete HEP for LE ROM/STrengthenig with S PT Short Term Goal 4 - Progress (Week 1): Met Week 2:  PT Short Term Goal 1 (Week 2): = LTGs  Skilled Therapeutic Interventions/Progress Updates:  Ambulation/gait training;Balance/vestibular training;Disease  management/prevention;Discharge planning;Community reintegration;DME/adaptive equipment instruction;Functional mobility training;Psychosocial support;Patient/family education;Pain management;Neuromuscular re-education;Skin care/wound management;Splinting/orthotics;Stair training;Therapeutic Activities;UE/LE Coordination activities;UE/LE Strength taining/ROM;Therapeutic Exercise;Wheelchair propulsion/positioning   Therapy Documentation Precautions:  Precautions Precautions: Fall Restrictions Weight Bearing Restrictions: Yes RLE Weight Bearing: Non weight bearing General:   Pain: Denies pain   Locomotion : Ambulation Ambulation/Gait Assistance: 3: Mod assist   See FIM for current functional status  Therapy/Group: Individual Therapy  [name] [facility] 12/29/2011, 9:49 AM

## 2011-12-29 NOTE — Progress Notes (Signed)
Physical Therapy Session Note  Patient Details  Name: Jamie Romero MRN: [id number] Date of Birth: [birth year]  Today's Date: 12/29/2011 Time: 1530-1600 Time Calculation (min): 30 min  Short Term Goals: Week 2:  PT Short Term Goal 1 (Week 2): = LTGs  Skilled Therapeutic Interventions/Progress Updates:   UE strengthening and endurance on UE ergometer on random program level 5 x 5 minutes forward, 5 minutes backwards. W/c mobility on unit and in pt room S. Transfer to commode with mod A at end of session using slideboard with call bell in reach.   Therapy Documentation Precautions:  Precautions Precautions: Fall Restrictions Weight Bearing Restrictions: Yes RLE Weight Bearing: Non weight bearing   Pain:  Denies pain.  See FIM for current functional status  Therapy/Group: Individual Therapy  [name] [facility] 12/29/2011, 4:03 PM

## 2011-12-29 NOTE — Progress Notes (Signed)
Physical Therapy Session Note  Patient Details  Name: STORY VANVRANKEN MRN: [id number] Date of Birth: [birth year]  Today's Date: 12/29/2011 Time: 1300-1355 Time Calculation (min): 55 min  Short Term Goals: Week 1:  PT Short Term Goal 1 (Week 1): Pt will be able to perform functional transfers with min A PT Short Term Goal 1 - Progress (Week 1): Met PT Short Term Goal 2 (Week 1): Pt will be able to complete sit to stand with mod A PT Short Term Goal 2 - Progress (Week 1): Met PT Short Term Goal 3 (Week 1): Pt will be able to prople w/c in controlled environment x 150' with S PT Short Term Goal 3 - Progress (Week 1): Met PT Short Term Goal 4 (Week 1): Pt will be able to complete HEP for LE ROM/STrengthenig with S PT Short Term Goal 4 - Progress (Week 1): Met  Skilled Therapeutic Interventions/Progress Updates:  W/c propulsion on unit for endurance and strengthening. Therex for functional strengthening bilateral LE's (2# ankle weight on L) to complete: LAQs, heel slides, hip abduction, modified bridging, mini crunches, and sidelying hip extension on L all 3 sets of 10 reps with rest breaks as needed. Completed slide board transfers with S using dysum under L foot to prevent slipping.   Therapy Documentation Precautions:  Precautions Precautions: Fall Restrictions Weight Bearing Restrictions: Yes RLE Weight Bearing: Non weight bearing    Pain: Pain Assessment Pain Assessment: No/denies pain  See FIM for current functional status  Therapy/Group: Individual Therapy  [name] [facility] 12/29/2011, 1:55 PM

## 2011-12-29 NOTE — Progress Notes (Signed)
Inpatient RehabilitationTeam Conference Note Date: 12/28/2011   Time: 2:25 PM    Patient Name: Jamie Romero      Medical Record Number: [id number]  Date of Birth: 09/13/1922 Sex: Male         Room/Bed: 4006/4006-01 Payor Info: Payor: MEDICARE  Plan: MEDICARE PART A AND B  Product Type: *No Product type*     Admitting Diagnosis: RT BKA,MRSA  Admit Date/Time:  12/20/2011  3:01 PM Admission Comments: No comment available   Primary Diagnosis:  Unilateral complete BKA Principal Problem: Unilateral complete BKA  Patient Active Problem List   Diagnosis Date Noted  . Unilateral complete BKA 12/20/2011  . Diabetic foot ulcer with osteomyelitis 12/15/2011  . Anemia 12/15/2011  . Primary osteoarthritis of both knees 06/01/2010  . HYPERLIPIDEMIA 02/26/2008  . GERD 01/29/2007  . DIABETES MELLITUS, TYPE II 01/25/2007  . HYPERTENSION 01/25/2007  . AORTIC VALVE REPLACEMENT, HX OF 01/25/2007    Expected Discharge Date: Expected Discharge Date: 01/03/12  Team Members Present: Physician: Dr. Faith Rogue Social Worker Present: Amada Jupiter, LCSW Nurse Present: Other (comment) (Melissa Ramgeet, RN) PT Present: [name], PT OT Present: Mackie Pai, OT;Patricia Mat Carne, OT     Current Status/Progress Goal Weekly Team Focus  Medical   wound heaing, anemic  see prior  stool for ob   Bowel/Bladder   Able to use urinal during the day. Spills at noc, per report, using brief at hs. Continent of bowel. LBM 12/27/11  Continent of bowel and bladder  Offer urinal during the noc   Swallow/Nutrition/ Hydration             ADL's   overall supervision -> min assist, mod assist for toilet transfers  overall supervision  ADL retraining, ADL transfers, overall activity tolerance/endurance, UB strengthening   Mobility   min A with slideboard transfers, S w/c propulsion, max/total A for sit to stand  S transfers, mod I w/c mobility  family education, endurance, uneven transfers, sit to stands, and  HEP   Communication             Safety/Cognition/ Behavioral Observations            Pain   No c/o pain  <3  Monitor   Skin   Skin tear to R forearm and L wrist with tegaderm intact. LLE edemaous. R AKA incision with staples and small amount of red blood to R distal end, Bruising to BUE  No additional skin breakdown  Monitor skin for healing. Assist with turn q 2hrs    Rehab Goals Patient on target to meet rehab goals: Yes *See Interdisciplinary Assessment and Plan and progress notes for long and short-term goals  Barriers to Discharge: fatigue, see prior    Possible Resolutions to Barriers:  family ed    Discharge Planning/Teaching Needs:  home with family to provide 24/7 assisstance      Team Discussion:  Slowly rebuilding strength and anticipate will need sliding board for transfers upon d/c.  Goals remain at close supervision.  Need to begin family education ASAP.  Revisions to Treatment Plan:  None   Continued Need for Acute Rehabilitation Level of Care: The patient requires daily medical management by a physician with specialized training in physical medicine and rehabilitation for the following conditions: Daily direction of a multidisciplinary physical rehabilitation program to ensure safe treatment while eliciting the highest outcome that is of practical value to the patient.: Yes Daily medical management of patient stability for increased activity during participation  in an intensive rehabilitation regime.: Yes Daily analysis of laboratory values and/or radiology reports with any subsequent need for medication adjustment of medical intervention for : Post surgical problems;Neurological problems;Other  Manish Ruggiero 12/29/2011, 2:20 PM

## 2011-12-29 NOTE — Progress Notes (Signed)
Occupational Therapy Session Note  Patient Details  Name: Jamie Romero MRN: [id number] Date of Birth: 11-26-1922  Today's Date: 12/29/2011 Time: 4098-1191 Time Calculation (min): 45 min  Short Term Goals: Week 1:  OT Short Term Goal 1 (Week 1): Patient will perform UB dressing sitting edge of bed with min/steady assist OT Short Term Goal 1 - Progress (Week 1): Met OT Short Term Goal 2 (Week 1): Patient will perform LB dressing at bed level with moderate assistance OT Short Term Goal 2 - Progress (Week 1): Met OT Short Term Goal 3 (Week 1): Patient will perform toilet transfer with minimal assistance OT Short Term Goal 3 - Progress (Week 1): Progressing toward goal OT Short Term Goal 4 (Week 1): UE HEP will be implemented OT Short Term Goal 4 - Progress (Week 1): Met  Skilled Therapeutic Interventions/Progress Updates:    Pt on toilet upon arrival.  Pt completed toileitng requiring assistance with pulling up pants and with squat pivot transfer from toilet to w/c.  Pt engaged in bathing and UB dressing tasks seated in w/c at sink.  Pt transferred to bed to complete bathing buttocks and LB dressing at bed level.  Pt required mod A for squat pivot transfer to bed but completed sliding board transfer bed>wc/c with supervision after placement of board.  Pt exhibits difficulty with pushing through LLE to successfully complete effective sit>stand at sink to allow therapist to pull up pants.  Focus on transfers, activity tolerance, and safety awareness.  Therapy Documentation Precautions:  Precautions Precautions: Fall Restrictions Weight Bearing Restrictions: Yes RLE Weight Bearing: Non weight bearing   Pain: Pain Assessment Pain Assessment: No/denies pain  See FIM for current functional status  Therapy/Group: Individual Therapy  Rich Brave 12/29/2011, 11:32 AM

## 2011-12-29 NOTE — Progress Notes (Signed)
Subjective/Complaints: No new problems. Slept well. Pain under control   . A 12 point review of systems has been performed and if not noted above is otherwise negative.    Objective: Vital Signs: Blood pressure 102/59, pulse 62, temperature 98.1 F (36.7 C), temperature source Oral, resp. rate 19, height 5\' 6"  (1.676 m), weight 75.9 kg (167 lb 5.3 oz), SpO2 94.00%. No results found.  Basename 12/29/11 0505 12/28/11 0625  WBC 3.7* 4.4  HGB 8.0* 8.1*  HCT 25.4* 25.0*  PLT 108* 106*    Basename 12/29/11 0505  NA 134*  K 4.8  CL 106  CO2 25  GLUCOSE 94  BUN 18  CREATININE 0.86  CALCIUM 8.2*   CBG (last 3)   Basename 12/29/11 0710 12/28/11 2043 12/28/11 1629  GLUCAP 83 89 138*    Wt Readings from Last 3 Encounters:  12/27/11 75.9 kg (167 lb 5.3 oz)  12/16/11 69.446 kg (153 lb 1.6 oz)  12/16/11 69.446 kg (153 lb 1.6 oz)    Physical Exam:  Constitutional: He is oriented to person, place, and time.  Pleasant thin elderly male, NAD.  HENT:  Head: Normocephalic and atraumatic.  edentulous  Eyes: Pupils are equal, round, and reactive to light.  Neck: Normal range of motion.  Cardiovascular: Normal rate and regular rhythm.  Pulmonary/Chest: Effort normal and breath sounds normal.  Abdominal: Soft. Bowel sounds are normal. He exhibits no distension. There is no tenderness.  Musculoskeletal: He exhibits edema (2+ left pedal.).  R_BKA with ACE Neurological: He is alert and oriented to person, place, and time.  Decreased sensation left foot/leg in a stocking glove distribution.. Left foot drop and weakness with eversion as well. LLE is grossly 4/5 except for affected areas which are grossly 1/5.  Skin: Skin is warm and dry. Rash gradually improving. Incision in tactwith minimal drainage serosanginous   Assessment/Plan: 1. Functional deficits secondary to right BKA  which require 3+ hours per day of interdisciplinary therapy in a comprehensive inpatient rehab  setting. Physiatrist is providing close team supervision and 24 hour management of active medical problems listed below. Physiatrist and rehab team continue to assess barriers to discharge/monitor patient progress toward functional and medical goals. FIM: FIM - Bathing Bathing Steps Patient Completed: Chest;Right Arm;Left Arm;Abdomen;Front perineal area;Buttocks;Right upper leg;Left upper leg;Left lower leg (including foot) Bathing: 4: Steadying assist (seated on BSC)  FIM - Upper Body Dressing/Undressing Upper body dressing/undressing steps patient completed: Thread/unthread right sleeve of front closure shirt/dress;Thread/unthread left sleeve of front closure shirt/dress;Pull shirt around back of front closure shirt/dress;Button/unbutton shirt Upper body dressing/undressing: 5: Set-up assist to: Obtain clothing/put away FIM - Lower Body Dressing/Undressing Lower body dressing/undressing steps patient completed: Thread/unthread right underwear leg;Thread/unthread left underwear leg;Thread/unthread right pants leg;Thread/unthread left pants leg;Don/Doff left sock;Don/Doff left shoe;Fasten/unfasten left shoe Lower body dressing/undressing: 4: Min-Patient completed 75 plus % of tasks  FIM - Toileting Toileting steps completed by patient: Adjust clothing prior to toileting;Performs perineal hygiene Toileting: 3: Mod-Patient completed 2 of 3 steps  FIM - Diplomatic Services operational officer Devices: Bedside commode;Sliding board (board from [facility] -> w/c) Toilet Transfers: 3-To toilet/BSC: Mod A (lift or lower assist);4-From toilet/BSC: Min A (steadying Pt. > 75%)  FIM - Banker Devices: Sliding board Bed/Chair Transfer: 5: Supine > Sit: Supervision (verbal cues/safety issues);3: Bed > Chair or W/C: Mod A (lift or lower assist)  FIM - Locomotion: Wheelchair Distance: 100 Locomotion: Wheelchair: 5: Travels 150 ft or more: maneuvers on rugs and over  door sills with supervision, cueing or coaxing FIM - Locomotion: Ambulation Locomotion: Ambulation Assistive Devices: Parallel bars Ambulation/Gait Assistance: 3: Mod assist Locomotion: Ambulation: 0: Activity did not occur  Comprehension Comprehension Mode: Auditory Comprehension: 6-Follows complex conversation/direction: With extra time/assistive device  Expression Expression Mode: Verbal Expression: 6-Expresses complex ideas: With extra time/assistive device  Social Interaction Social Interaction: 6-Interacts appropriately with others with medication or extra time (anti-anxiety, antidepressant).  Problem Solving Problem Solving: 6-Solves complex problems: With extra time  Memory Memory: 6-More than reasonable amt of time  Medical Problem List and Plan:  1. DVT Prophylaxis/Anticoagulation: Pharmaceutical: Lovenox  2. Pain Management: Continue hydrocodone prn  3. Mood: team will provide egosupport as appropriate  4. Neuropsych: This patient is capable of making decisions on his/her own behalf.  5. DM type 2: Continue AC/HS cbg checks to keep BS controlled and help wound healing. Sugars normalizing with less am hypoglycemia--monitor for now 6. HTN: Monitor with bid checks. Continue terazosin, ranexa, lasix, and digoxin. Lisinopril discontinued due to low BP.  7. GERD: On protonix.  8. Iron deficiency anemia: continue iron supplements. hgb around down to 8.1---heme check stools (not done yet)  Recheck today 8.0, check serially, hopefully reaching his nadir 9. Hyponatremia: resolving 10. H/o angina: continue ranexa.  11. Dermatitis: hydrocortisone cream.  LOS (Days) 9 A FACE TO FACE EVALUATION WAS PERFORMED  Jamie Romero T 12/29/2011, 7:19 AM

## 2011-12-29 NOTE — Progress Notes (Signed)
Social Work Patient ID: [name], male   DOB: 1923-01-02, 78 y.o.   MRN: [id number]  Have spoken with pt's son and daughter-in-law to review team conference.  Both aware and agreeable with change in d/c date to 10/21 at minimal assistance to supervision goals w/c level.  Family education has been scheduled for tomorrow morning.  Will follow up with them as well when they are here. (pt's grandson to bring pt's w/c to hospital tonight).  Arine Foley

## 2011-12-30 ENCOUNTER — Inpatient Hospital Stay (HOSPITAL_COMMUNITY): Payer: Medicare Other | Admitting: Physical Therapy

## 2011-12-30 ENCOUNTER — Inpatient Hospital Stay (HOSPITAL_COMMUNITY): Payer: Medicare Other

## 2011-12-30 ENCOUNTER — Inpatient Hospital Stay (HOSPITAL_COMMUNITY): Payer: Medicare Other | Admitting: Occupational Therapy

## 2011-12-30 LAB — GLUCOSE, CAPILLARY
Glucose-Capillary: 83 mg/dL (ref 70–99)
Glucose-Capillary: 143 mg/dL — ABNORMAL HIGH (ref 70–99)
Glucose-Capillary: 122 mg/dL — ABNORMAL HIGH (ref 70–99)
Glucose-Capillary: 150 mg/dL — ABNORMAL HIGH (ref 70–99)

## 2011-12-30 NOTE — Progress Notes (Signed)
Physical Therapy Session Note  Patient Details  Name: Jamie Romero MRN: [id number] Date of Birth: 08/25/1922  Today's Date: 12/30/2011 Time: 0815-0910 Time Calculation (min): 55 min  Short Term Goals: Week 1:  PT Short Term Goal 1 (Week 1): Pt will be able to perform functional transfers with min A PT Short Term Goal 1 - Progress (Week 1): Met PT Short Term Goal 2 (Week 1): Pt will be able to complete sit to stand with mod A PT Short Term Goal 2 - Progress (Week 1): Met PT Short Term Goal 3 (Week 1): Pt will be able to prople w/c in controlled environment x 150' with S PT Short Term Goal 3 - Progress (Week 1): Met PT Short Term Goal 4 (Week 1): Pt will be able to complete HEP for LE ROM/STrengthenig with S PT Short Term Goal 4 - Progress (Week 1): Met  Skilled Therapeutic Interventions/Progress Updates:    Bed mobility retraining including rolling,  supine <-> sit , and lateral leans EOB to put on and pull up pants with S. Steady A transfer OOB (uneven surface) with dysum under L foot, pt managing w/c parts with extra time. W/c propulsion on unit for endurance and strengthening to/from therapy. S transfers w/c <-> mat with slideboard, min cues needed to remember to remove legrest and place dysum under L foot. Pushup blocks x 10 reps to increase UE strength for transfers.  LE therex for functional strengthening to increase mobility: LAQ, heel slides on L, SLR on R, modified bridging x 10 reps x 2 sets with 2 # ankle weight on LLE.   Therapy Documentation Precautions:  Precautions Precautions: Fall Restrictions Weight Bearing Restrictions: Yes RLE Weight Bearing: Non weight bearing Pain:  Denies pain.   See FIM for current functional status  Therapy/Group: Individual Therapy  [name] [facility] 12/30/2011, 9:10 AM

## 2011-12-30 NOTE — Progress Notes (Signed)
Physical Therapy Session Note  Patient Details  Name: [name] MRN: [id number] Date of Birth: 1922/08/09  Today's Date: 12/30/2011 Time: 1331-1400 Time Calculation (min): 29 min  Short Term Goals: Week 2:  PT Short Term Goal 1 (Week 2): = LTGs  Skilled Therapeutic Interventions/Progress Updates:   See below for details.  Pt reported that his L ankle is weak and he has very little dorsiflexion which makes it difficult for him to maintain his balance.  Also, reported weak UEs.  Fixed brakes on pt's w/c from home. Therapy Documentation Precautions:  Precautions Precautions: Fall Restrictions Weight Bearing Restrictions: Yes RLE Weight Bearing: Non weight bearing Pain:  No pain Mobility:  Sit to stand in parallel bars, pulling up on bars with max@.  Standing in bars with mod@ for less than 1 min. X 2.  Asked pt if he wanted to take some steps but he declined due to weakness.  Sliding board transfer with close supervision w/c to mat. Exercises: Performed UE ergometer for 4 min for UE strengthening exercise. See FIM for current functional status  Therapy/Group: Individual Therapy  Georges Mouse 12/30/2011, 3:28 PM

## 2011-12-30 NOTE — Plan of Care (Signed)
Problem: RH BOWEL ELIMINATION Goal: RH STG MANAGE BOWEL WITH ASSISTANCE STG Manage Bowel with Assistance. Mod I Outcome: Progressing Calls appropriately for toileting assistance

## 2011-12-30 NOTE — Progress Notes (Addendum)
Physical Therapy Session Note  Patient Details  Name: Jamie Romero MRN: [id number] Date of Birth: [birth year]  Today's Date: 12/30/2011 Time: 1107-1210 Time Calculation (min): 63 min  Short Term Goals: Week 2:  PT Short Term Goal 1 (Week 2): = LTGs  Skilled Therapeutic Interventions/Progress Updates:    This session focused on family education and primarily on slide board transfers from different height surfaces.  Car, drop arm BSC, bed.  Family brought pt's WC from home which needs a basic cushion and an amputee support pad.  Case management working on getting these ordered.  Family actively transferred in each scenario listed above with car (because they will be getting into an SUV) being the most difficult requiring +2 total assist from his son (who is also an amputee) and daughter in law.  HEP was given and reviewed with pt and family per family request.  Pt will also likely need a hospital bed due to his bed is so high it would be a very difficult transfer for him.  Family provided with dysom sticky pads to help with transfers at home.    Therapy Documentation Precautions:  Precautions Precautions: Fall Restrictions Weight Bearing Restrictions: Yes RLE Weight Bearing: Non weight bearing   Mobility: Bed Mobility Supine to Sit: 6: Modified independent (Device/Increase time);With rails;HOB flat Sitting - Scoot to Edge of Bed: 6: Modified independent (Device/Increase time);With rail Sit to Supine: 6: Modified independent (Device/Increase time);With rail;HOB flat Transfers Lateral/Scoot Transfers: With slide board;4: Min assist;3: Mod assist Lateral/Scoot Transfer Details: Verbal cues for technique;Verbal cues for sequencing;Verbal cues for precautions/safety;Manual facilitation for weight shifting Lateral/Scoot Transfer Details (indicate cue type and reason): On level surfaces pt can get up to min assist (close supervision) and with low to high transfers he needs mod aassist to  stabilize the board and keep him from sliding back down to the lower surface.      Exercises: General Exercises - Lower Extremity Straight Leg Raises: AROM;Right;10 reps;Supine Total Joint Exercises Quad Sets: AROM;Right;10 reps;Supine Gluteal Sets: AROM;Both;10 reps;Supine Hip ABduction/ADduction: AROM;Right;10 reps;Supine;Sidelying Other Exercises Other Exercises: left sidelying hip extension x 10 reps AROM right leg.   Other Exercises: bridge x 10 reps supine with right leg straight.   Other Exercises: HEP (see exercises above) printed and reviewed with pt and family.  Copy left in paper chart.   Other Treatments: Treatments Therapeutic Activity: This session was family ed focused.  We practiced transfers from bed <-> WC, WC <-> drop arm BSC, and WC<-> car.  Family was able to perfrom transfers with patient from his own WC.  He will need a basic cushion and an amputee pad since he does not have these on his current chair.  Case management is working on this.  He went up to total assist +2 for car transfer once family told me how high the car is he is getting into.  Son and his wife were able to demonstrate a safe transfer into and out of the car.  Pt's son is also an amputee and walks independently with a prosthesis on his left leg.    See FIM for current functional status  Therapy/Group: Individual Therapy  Jamie Romero B. Jamie Romero, PT, DPT [phone]   12/30/2011, 3:57 PM

## 2011-12-30 NOTE — Progress Notes (Signed)
Occupational Therapy Session Note  Patient Details  Name: Jamie Romero MRN: [id number] Date of Birth: 04-Nov-1922  Today's Date: 12/30/2011 Time: 1000-1100 Time Calculation (min): 60 min  Short Term Goals: Week 1:  OT Short Term Goal 1 (Week 1): Patient will perform UB dressing sitting edge of bed with min/steady assist OT Short Term Goal 1 - Progress (Week 1): Met OT Short Term Goal 2 (Week 1): Patient will perform LB dressing at bed level with moderate assistance OT Short Term Goal 2 - Progress (Week 1): Met OT Short Term Goal 3 (Week 1): Patient will perform toilet transfer with minimal assistance OT Short Term Goal 3 - Progress (Week 1): Progressing toward goal OT Short Term Goal 4 (Week 1): UE HEP will be implemented OT Short Term Goal 4 - Progress (Week 1): Met  Week 2:  OT Short Term Goal 1 (Week 2): Short Term Goals = Long Term Goals  Skilled Therapeutic Interventions/Progress Updates:  Patient found seated in w/c. Patient's son and daughter-in-law present in room and during entire therapy session. Focused skilled intervention on family education regarding slide board transfers, discussion on [facility] slide board transfers & toileting, discussion and demonstration on tub/shower transfers on tub transfer bench using slide board, UB/LB bathing & dressing, bed mobility, slide board transfers w/c <-> bed. Recommend patient perform slide board for all transfers at this time. Son and daughter-in-law demonstrated hands-on during slide board transfers. At end of session left patient in therapy gym for next therapy session.   Precautions:  Precautions Precautions: Fall Restrictions Weight Bearing Restrictions: Yes RLE Weight Bearing: Non weight bearing  See FIM for current functional status  Therapy/Group: Individual Therapy  Rufus Cypert 12/30/2011, 11:31 AM

## 2011-12-30 NOTE — Progress Notes (Signed)
Subjective/Complaints: No new problems. Slept well.eating breakfast l   . A 12 point review of systems has been performed and if not noted above is otherwise negative.    Objective: Vital Signs: Blood pressure 124/45, pulse 61, temperature 98 F (36.7 C), temperature source Oral, resp. rate 16, height 5\' 6"  (1.676 m), weight 75.9 kg (167 lb 5.3 oz), SpO2 98.00%. No results found.  Basename 12/29/11 0505 12/28/11 0625  WBC 3.7* 4.4  HGB 8.0* 8.1*  HCT 25.4* 25.0*  PLT 108* 106*    Basename 12/29/11 0505  NA 134*  K 4.8  CL 106  CO2 25  GLUCOSE 94  BUN 18  CREATININE 0.86  CALCIUM 8.2*   CBG (last 3)   Basename 12/30/11 0710 12/29/11 1639 12/29/11 1118  GLUCAP 83 77 100*    Wt Readings from Last 3 Encounters:  12/27/11 75.9 kg (167 lb 5.3 oz)  12/16/11 69.446 kg (153 lb 1.6 oz)  12/16/11 69.446 kg (153 lb 1.6 oz)    Physical Exam:  Constitutional: He is oriented to person, place, and time.  Pleasant thin elderly male, NAD.  HENT:  Head: Normocephalic and atraumatic.  edentulous  Eyes: Pupils are equal, round, and reactive to light.  Neck: Normal range of motion.  Cardiovascular: Normal rate and regular rhythm.  Pulmonary/Chest: Effort normal and breath sounds normal.  Abdominal: Soft. Bowel sounds are normal. He exhibits no distension. There is no tenderness.  Musculoskeletal: He exhibits edema (2+ left pedal.).  R_BKA with ACE Neurological: He is alert and oriented to person, place, and time.  Decreased sensation left foot/leg in a stocking glove distribution.. Left foot drop and weakness with eversion as well. LLE is grossly 4/5 except for affected areas which are grossly 1/5.  Skin: Skin is warm and dry. Rash gradually improving. Incision in tactwith minimal drainage serosanginous   Assessment/Plan: 1. Functional deficits secondary to right BKA  which require 3+ hours per day of interdisciplinary therapy in a comprehensive inpatient rehab  setting. Physiatrist is providing close team supervision and 24 hour management of active medical problems listed below. Physiatrist and rehab team continue to assess barriers to discharge/monitor patient progress toward functional and medical goals. FIM: FIM - Bathing Bathing Steps Patient Completed: Chest;Right Arm;Left Arm;Abdomen;Front perineal area;Buttocks;Right upper leg;Left upper leg;Left lower leg (including foot) Bathing: 4: Steadying assist (seated on BSC)  FIM - Upper Body Dressing/Undressing Upper body dressing/undressing steps patient completed: Thread/unthread right sleeve of front closure shirt/dress;Thread/unthread left sleeve of front closure shirt/dress;Pull shirt around back of front closure shirt/dress;Button/unbutton shirt Upper body dressing/undressing: 5: Set-up assist to: Obtain clothing/put away FIM - Lower Body Dressing/Undressing Lower body dressing/undressing steps patient completed: Thread/unthread right underwear leg;Thread/unthread left underwear leg;Thread/unthread right pants leg;Thread/unthread left pants leg;Don/Doff left sock;Don/Doff left shoe;Fasten/unfasten left shoe Lower body dressing/undressing: 4: Min-Patient completed 75 plus % of tasks  FIM - Toileting Toileting steps completed by patient: Adjust clothing prior to toileting;Performs perineal hygiene Toileting: 3: Mod-Patient completed 2 of 3 steps  FIM - Diplomatic Services operational officer Devices: Psychiatrist Transfers: 3-To toilet/BSC: Mod A (lift or lower assist)  FIM - Banker Devices: Sliding board;Arm rests Bed/Chair Transfer: 5: Bed > Chair or W/C: Supervision (verbal cues/safety issues);5: Chair or W/C > Bed: Supervision (verbal cues/safety issues)  FIM - Locomotion: Wheelchair Distance: 100 Locomotion: Wheelchair: 5: Travels 150 ft or more: maneuvers on rugs and over door sills with supervision, cueing or coaxing FIM -  Locomotion: Ambulation Locomotion:  Ambulation Assistive Devices: Parallel bars Ambulation/Gait Assistance: 3: Mod assist Locomotion: Ambulation: 1: Travels less than 50 ft with moderate assistance (Pt: 50 - 74%)  Comprehension Comprehension Mode: Auditory Comprehension: 6-Follows complex conversation/direction: With extra time/assistive device  Expression Expression Mode: Verbal Expression: 6-Expresses complex ideas: With extra time/assistive device  Social Interaction Social Interaction: 6-Interacts appropriately with others with medication or extra time (anti-anxiety, antidepressant).  Problem Solving Problem Solving: 6-Solves complex problems: With extra time  Memory Memory: 6-More than reasonable amt of time  Medical Problem List and Plan:  1. DVT Prophylaxis/Anticoagulation: Pharmaceutical: Lovenox  2. Pain Management: Continue hydrocodone prn  3. Mood: team will provide egosupport as appropriate  4. Neuropsych: This patient is capable of making decisions on his/her own behalf.  5. DM type 2: Continue AC/HS cbg checks to keep BS controlled and help wound healing. Sugars normalizing with less am hypoglycemia in general--monitor for now 6. HTN: Monitor with bid checks. Continue terazosin, ranexa, lasix, and digoxin. Lisinopril discontinued due to low BP.  7. GERD: On protonix.  8. Iron deficiency anemia: continue iron supplements. hgb around down to 8.1---heme check stools (not done yet)  Recheck today 8.0, check serially, hopefully reaching his nadir 9. Hyponatremia: resolving 10. H/o angina: continue ranexa.  11. Dermatitis: hydrocortisone cream.  LOS (Days) 10 A FACE TO FACE EVALUATION WAS PERFORMED  SWARTZ,ZACHARY T 12/30/2011, 7:38 AM

## 2011-12-31 ENCOUNTER — Inpatient Hospital Stay (HOSPITAL_COMMUNITY): Payer: Medicare Other | Admitting: Physical Therapy

## 2011-12-31 ENCOUNTER — Inpatient Hospital Stay (HOSPITAL_COMMUNITY): Payer: Medicare Other | Admitting: Occupational Therapy

## 2011-12-31 DIAGNOSIS — S88119A Complete traumatic amputation at level between knee and ankle, unspecified lower leg, initial encounter: Secondary | ICD-10-CM

## 2011-12-31 DIAGNOSIS — D62 Acute posthemorrhagic anemia: Secondary | ICD-10-CM

## 2011-12-31 DIAGNOSIS — L98499 Non-pressure chronic ulcer of skin of other sites with unspecified severity: Secondary | ICD-10-CM

## 2011-12-31 DIAGNOSIS — Z5189 Encounter for other specified aftercare: Secondary | ICD-10-CM

## 2011-12-31 DIAGNOSIS — I739 Peripheral vascular disease, unspecified: Secondary | ICD-10-CM

## 2011-12-31 LAB — GLUCOSE, CAPILLARY
Glucose-Capillary: 123 mg/dL — ABNORMAL HIGH (ref 70–99)
Glucose-Capillary: 195 mg/dL — ABNORMAL HIGH (ref 70–99)

## 2011-12-31 LAB — CBC
Hemoglobin: 8.1 g/dL — ABNORMAL LOW (ref 13.0–17.0)
MCH: 27.5 pg (ref 26.0–34.0)
MCHC: 32.8 g/dL (ref 30.0–36.0)

## 2011-12-31 LAB — OCCULT BLOOD X 1 CARD TO LAB, STOOL: Fecal Occult Bld: POSITIVE

## 2011-12-31 NOTE — Progress Notes (Signed)
Physical Therapy Session Note  Patient Details  Name: Jamie Romero MRN: [id number] Date of Birth: 1923/02/14  Today's Date: 12/31/2011 Time: 4098-1191 Time Calculation (min): 47 min  Short Term Goals: Week 2:  PT Short Term Goal 1 (Week 2): = LTGs  Skilled Therapeutic Interventions/Progress Updates:    Session #1: This session focused on transfers from diffierent height surfaces , WC mobility and amputee exercises as well as endurance training on the NuStep.  See details below.     Session #2: This session focused on WC mobility over different flat surface (carpet and tile), standing endurance with the Huntley Dec Plus (x4 stands ranging from 1-3 mins), standing exercises while in the [place] Plus (hip abduction, extension and knee flexion), and gait with RW 2 person assist needed to get to standing with RW and to follow with chair for safety and to encourage increased gait distance.  Pt. Was really proud that he was able to take hop steps today.  Re-wrapped his right residual limb due to wrap was coming undone.    Therapy Documentation Precautions:  Precautions Precautions: Fall Restrictions Weight Bearing Restrictions: Yes RLE Weight Bearing: Non weight bearing    Vital Signs: Therapy Vitals Temp: 97.6 F (36.4 C) Temp src: Oral Pulse Rate: 59  Resp: 18  BP: 121/51 mmHg Patient Position, if appropriate: Sitting Oxygen Therapy SpO2: 100 % Pain: Pain Assessment Pain Assessment: No/denies pain Pain Score: 0-No pain Mobility: Bed Mobility Supine to Sit: 6: Modified independent (Device/Increase time);HOB flat;With rails Sitting - Scoot to Edge of Bed: 6: Modified independent (Device/Increase time);With rail Transfers Sit to Stand: 1: +2 Total assist;With upper extremity assist;With armrests;From chair/3-in-1 Sit to Stand Details: Verbal cues for technique;Verbal cues for precautions/safety;Verbal cues for safe use of DME/AE;Manual facilitation for weight shifting Sit to Stand  Details (indicate cue type and reason): sit to stand x 4 from [facility] with Huntley Dec Plus, then sit to stand to RW with toatl +2 x 2 (pt 75%) Stand to Sit: 3: Mod assist;With upper extremity assist;With armrests;To chair/3-in-1 Stand to Sit Details (indicate cue type and reason): Verbal cues for precautions/safety;Verbal cues for technique;Manual facilitation for weight shifting Stand to Sit Details: verbal cues to reach back for armrests, sat x4 with sara plus and x2 after gait with RW to [facility] Lateral/Scoot Transfers: With slide board;5: Supervision;With armrests removed Lateral/Scoot Transfer Details: Verbal cues for precautions/safety Lateral/Scoot Transfer Details (indicate cue type and reason): verbal cues to make sure board is placed correctly and to remind pt to remove armrest towards the side he is transferring.   Locomotion : Ambulation Ambulation: Yes Ambulation/Gait Assistance: 1: +2 Total assist Ambulation Distance (Feet): 20 Feet (10' x2) Assistive device: Rolling walker Ambulation/Gait Assistance Details: Verbal cues for precautions/safety;Verbal cues for gait pattern;Verbal cues for safe use of DME/AE;Manual facilitation for weight shifting Ambulation/Gait Assistance Details: =2 only for chair to follow and to get pt on his feet initally.  Acutal gait is mod assist, second person for [facility] to follow to encourage increased gait distance.  Hop to gait pattern.   Corporate treasurer: Yes Wheelchair Assistance: 5: Financial planner Details: Verbal cues for Engineer, drilling: Both upper extremities;Left lower extremity Wheelchair Parts Management: Supervision/cueing Distance: 150     Exercises: General Exercises - Lower Extremity Standing Knee Flexion: AROM;10 reps;Right Total Joint Exercises Quad Sets: AROM;Both;10 reps;Supine Towel Squeeze: AROM;Both;10 reps;Supine Short Arc Quad: AROM;Right;10 reps;Supine Hip ABduction/ADduction:  AROM;Both;10 reps;Supine;Standing (standing right hip abduction in sara plus) Long Arc Quad:  AROM;Right;10 reps;Seated Knee Flexion: AROM;Right;10 reps;Seated;Standing Standing Hip Extension: AROM;Right;10 reps;Standing Bridges: AROM;Left;Supine;10 reps Other Exercises Other Exercises: left sidelying hip extension x 10 reps AROM right leg.   Other Exercises: Nu Step level 3 x3 mins,  level 0 x 5 mins (pt was too fatigued at level 3), both arms and left leg.    See FIM for current functional status  Therapy/Group: Individual Therapy  Lurena Joiner B. Emiliya Chretien, PT, DPT [phone]   12/31/2011, 4:51 PM

## 2011-12-31 NOTE — Progress Notes (Signed)
Occupational Therapy Session Note  Patient Details  Name: Jamie Romero MRN: [id number] Date of Birth: June 25, 1922  Today's Date: 12/31/2011 Time: 7829-5621 Time Calculation (min): 27 min  Short Term Goals: Week 2:  OT Short Term Goal 1 (Week 2): Short Term Goals = Long Term Goals  Skilled Therapeutic Interventions/Progress Updates:  Patient up in w/c upon arrival.  Practiced w/c><drop arm commode via scoot method with mod assist and then via slide board method with min assist just to stabilize DME and slide board and vcs.  Patient performed all 3 toileting steps with supervision and vcs via lateral lean onto bed and/or onto w/c.  Therapy Documentation Precautions:  Precautions Precautions: Fall Restrictions Weight Bearing Restrictions: Yes RLE Weight Bearing: Non weight bearing Pain: Denies pain   See FIM for current functional status  Therapy/Group: Individual Therapy  Shevawn Langenberg 12/31/2011, 3:06 PM

## 2011-12-31 NOTE — Progress Notes (Signed)
Occupational Therapy Session Note  Patient Details  Name: Jamie Romero MRN: [id number] Date of Birth: 02-16-1923  Today's Date: 12/31/2011 Time: 1000-1055 Time Calculation (min): 55 min  Short Term Goals: Week 1:  OT Short Term Goal 1 (Week 1): Patient will perform UB dressing sitting edge of bed with min/steady assist OT Short Term Goal 1 - Progress (Week 1): Met OT Short Term Goal 2 (Week 1): Patient will perform LB dressing at bed level with moderate assistance OT Short Term Goal 2 - Progress (Week 1): Met OT Short Term Goal 3 (Week 1): Patient will perform toilet transfer with minimal assistance OT Short Term Goal 3 - Progress (Week 1): Progressing toward goal OT Short Term Goal 4 (Week 1): UE HEP will be implemented OT Short Term Goal 4 - Progress (Week 1): Met  Week 2:  OT Short Term Goal 1 (Week 2): Short Term Goals = Long Term Goals  Skilled Therapeutic Interventions/Progress Updates:  Patient found seated in w/c with no complaints of pain. Patient propelled self into bathroom for shower stall transfer onto shower seat. Patient requested to perform squat pivot transfer secondary to placement of w/c and shower seat, patient with good awareness regarding safe and effective transfers. Patient then performed UB/LB bathing at shower level in seated position, performing lateral leans for peri care. Patient transferred out of shower with mod assist secondary to uphill transfer then performed UB/LB dressing seated in w/c and patient needed assistance with pulling underwear and pants up to waist. Patient's daughter present during entire session. Focuses skilled intervention on family education, overall activity tolerance/endurance, transfers, ADL retraining, and skin care/wound management with nursing.   Precautions:  Precautions Precautions: Fall Restrictions Weight Bearing Restrictions: Yes RLE Weight Bearing: Non weight bearing  See FIM for current functional  status  Therapy/Group: Individual Therapy  Charita Lindenberger 12/31/2011, 11:12 AM

## 2011-12-31 NOTE — Progress Notes (Signed)
Subjective/Complaints: No new problems. Slept well.eating breakfast. Pain under control. l   . A 12 point review of systems has been performed and if not noted above is otherwise negative.    Objective: Vital Signs: Blood pressure 120/57, pulse 84, temperature 98.3 F (36.8 C), temperature source Oral, resp. rate 18, height 5\' 6"  (1.676 m), weight 75.9 kg (167 lb 5.3 oz), SpO2 95.00%. No results found.  Basename 12/29/11 0505  WBC 3.7*  HGB 8.0*  HCT 25.4*  PLT 108*    Basename 12/29/11 0505  NA 134*  K 4.8  CL 106  CO2 25  GLUCOSE 94  BUN 18  CREATININE 0.86  CALCIUM 8.2*   CBG (last 3)   Basename 12/30/11 2101 12/30/11 1642 12/30/11 1141  GLUCAP 150* 122* 143*    Wt Readings from Last 3 Encounters:  12/27/11 75.9 kg (167 lb 5.3 oz)  12/16/11 69.446 kg (153 lb 1.6 oz)  12/16/11 69.446 kg (153 lb 1.6 oz)    Physical Exam:  Constitutional: He is oriented to person, place, and time.  Pleasant thin elderly male, NAD.  HENT:  Head: Normocephalic and atraumatic.  edentulous  Eyes: Pupils are equal, round, and reactive to light.  Neck: Normal range of motion.  Cardiovascular: Normal rate and regular rhythm.  Pulmonary/Chest: Effort normal and breath sounds normal.  Abdominal: Soft. Bowel sounds are normal. He exhibits no distension. There is no tenderness.  Musculoskeletal: He exhibits edema (2+ left pedal.).  R_BKA with ACE Neurological: He is alert and oriented to person, place, and time.  Decreased sensation left foot/leg in a stocking glove distribution.. Left foot drop and weakness with eversion as well. LLE is grossly 4/5 except for affected areas which are grossly 1/5.  Skin: Skin is warm and dry. Rash gradually improving. Incision in tactwith minimal drainage serosanginous   Assessment/Plan: 1. Functional deficits secondary to right BKA  which require 3+ hours per day of interdisciplinary therapy in a comprehensive inpatient rehab setting. Physiatrist  is providing close team supervision and 24 hour management of active medical problems listed below. Physiatrist and rehab team continue to assess barriers to discharge/monitor patient progress toward functional and medical goals. FIM: FIM - Bathing Bathing Steps Patient Completed: Chest;Right Arm;Left Arm;Abdomen;Front perineal area;Buttocks;Right upper leg;Left upper leg;Left lower leg (including foot) Bathing: 5: Supervision: Safety issues/verbal cues (at bed level)  FIM - Upper Body Dressing/Undressing Upper body dressing/undressing steps patient completed: Thread/unthread right sleeve of front closure shirt/dress;Thread/unthread left sleeve of front closure shirt/dress;Pull shirt around back of front closure shirt/dress;Button/unbutton shirt Upper body dressing/undressing: 5: Set-up assist to: Obtain clothing/put away FIM - Lower Body Dressing/Undressing Lower body dressing/undressing steps patient completed: Thread/unthread right pants leg;Thread/unthread left pants leg;Pull pants up/down;Don/Doff left sock;Don/Doff left shoe;Fasten/unfasten left shoe Lower body dressing/undressing: 5: Supervision: Safety issues/verbal cues  FIM - Toileting Toileting steps completed by patient: Adjust clothing prior to toileting;Performs perineal hygiene Toileting: 0: Activity did not occur  FIM - Diplomatic Services operational officer Devices: Human resources officer Transfers: 3-To toilet/BSC: Mod A (lift or lower assist);4-From toilet/BSC: Min A (steadying Pt. > 75%)  FIM - Banker Devices: Sliding board Bed/Chair Transfer: 4: Bed > Chair or W/C: Min A (steadying Pt. > 75%);4: Chair or W/C > Bed: Min A (steadying Pt. > 75%)  FIM - Locomotion: Wheelchair Distance: 100 Locomotion: Wheelchair: 5: Travels 150 ft or more: maneuvers on rugs and over door sills with supervision, cueing or coaxing FIM - Locomotion: Ambulation Locomotion:  Ambulation Assistive Devices: Parallel bars Ambulation/Gait Assistance: 3: Mod assist Locomotion: Ambulation: 0: Activity did not occur  Comprehension Comprehension Mode: Auditory Comprehension: 6-Follows complex conversation/direction: With extra time/assistive device  Expression Expression Mode: Verbal Expression: 6-Expresses complex ideas: With extra time/assistive device  Social Interaction Social Interaction: 6-Interacts appropriately with others with medication or extra time (anti-anxiety, antidepressant).  Problem Solving Problem Solving: 6-Solves complex problems: With extra time  Memory Memory: 6-More than reasonable amt of time  Medical Problem List and Plan:  1. DVT Prophylaxis/Anticoagulation: Pharmaceutical: Lovenox  2. Pain Management: Continue hydrocodone prn  3. Mood: team will provide egosupport as appropriate  4. Neuropsych: This patient is capable of making decisions on his/her own behalf.  5. DM type 2: Continue AC/HS cbg checks to keep BS controlled and help wound healing. Sugars normalizing with less am hypoglycemia in general--monitor for now 6. HTN: Monitor with bid checks. Continue terazosin, ranexa, lasix, and digoxin. Lisinopril discontinued due to low BP.  7. GERD: On protonix.  8. Iron deficiency anemia: continue iron supplements. Stool heme positive  Recheck today , and repeat check of stool 9. Hyponatremia: resolving 10. H/o angina: continue ranexa.  11. Dermatitis: hydrocortisone cream.  LOS (Days) 11 A FACE TO FACE EVALUATION WAS PERFORMED  Alvira Hecht T 12/31/2011, 7:21 AM

## 2011-12-31 NOTE — Progress Notes (Signed)
Nutrition Follow-up  Intervention:  Pt is eating well at this time, continue current interventions.  Assessment:   Pt planning to d/c on 10/21. Intake of meals varies, but mostly >50%. Nurse tech reports that pt continues to eat well at this time, denies any concerns with his nutrition.  Diet Order:  Dysphagia 3 with thin liquids Supplements: snacks in between meals  Meds: Scheduled Meds:    . aspirin  81 mg Oral Daily  . atorvastatin  10 mg Oral Daily  . digoxin  125 mcg Oral Daily  . docusate sodium  200 mg Oral QHS  . enoxaparin  40 mg Subcutaneous Q24H  . ferrous sulfate  325 mg Oral BID  . furosemide  20 mg Oral Daily  . glipiZIDE  5 mg Oral Daily  . insulin aspart  0-9 Units Subcutaneous TID WC  . multivitamin with minerals  1 tablet Oral Daily  . pantoprazole  40 mg Oral BID AC  . ranolazine  500 mg Oral BID  . terazosin  10 mg Oral QHS  . traZODone  25 mg Oral QHS  . vitamin C  500 mg Oral Daily  . vitamin E  400 Units Oral Daily  . zinc sulfate  220 mg Oral Daily   Continuous Infusions:  PRN Meds:.acetaminophen, alum & mag hydroxide-simeth, bisacodyl, diphenhydrAMINE, guaiFENesin-dextromethorphan, HYDROcodone-acetaminophen, methocarbamol, polyethylene glycol, prochlorperazine, prochlorperazine, prochlorperazine  Labs:  CMP     Component Value Date/Time   NA 134* 12/29/2011 0505   K 4.8 12/29/2011 0505   CL 106 12/29/2011 0505   CO2 25 12/29/2011 0505   GLUCOSE 94 12/29/2011 0505   GLUCOSE 115* 03/04/2006 1152   BUN 18 12/29/2011 0505   CREATININE 0.86 12/29/2011 0505   CALCIUM 8.2* 12/29/2011 0505   PROT 4.8* 12/21/2011 0601   ALBUMIN 1.5* 12/21/2011 0601   AST 27 12/21/2011 0601   ALT 14 12/21/2011 0601   ALKPHOS 126* 12/21/2011 0601   BILITOT 0.5 12/21/2011 0601   GFRNONAA 76* 12/29/2011 0505   GFRAA 88* 12/29/2011 0505   CBG (last 3)   Basename 12/31/11 0718 12/30/11 2101 12/30/11 1642  GLUCAP 82 150* 122*    Intake/Output Summary (Last 24 hours) at  12/31/11 1048 Last data filed at 12/31/11 0700  Gross per 24 hour  Intake    660 ml  Output    780 ml  Net   -120 ml  BM 10/17  Weight Status:  167 lb - stable (usual wt is 170 lb) - no new wt (10/14)  Body mass index is 27.01 kg/(m^2). Overweight  Re-estimated needs:  1900 - 2080 kcal, 82 - 96 grams protein  Nutrition Dx:  Increased nutrient needs r/t healing AEB recent BKA. Ongoing.  Goal:  Pt to meet >/= 90% of their estimated nutrition needs - met.  Monitor:  weight trends, lab trends, I/O's, PO intake, supplement tolerance  Jarold Motto MS, RD, LDN Pager: [phone] After-hours pager: [phone]

## 2012-01-01 ENCOUNTER — Inpatient Hospital Stay (HOSPITAL_COMMUNITY): Payer: Medicare Other | Admitting: *Deleted

## 2012-01-01 LAB — GLUCOSE, CAPILLARY
Glucose-Capillary: 103 mg/dL — ABNORMAL HIGH (ref 70–99)
Glucose-Capillary: 119 mg/dL — ABNORMAL HIGH (ref 70–99)

## 2012-01-01 NOTE — Progress Notes (Signed)
Physical Therapy Note  Patient Details  Name: Jamie Romero MRN: [id number] Date of Birth: April 15, 1922 Today's Date: 01/01/2012  1100-1155 (55 minutes) group Pain: no complaint of pain Pt participated in PT group session focused on bilateral LE AROM/strengthening. Transfers sliding board  wc >< mat close SBA with vcs for wc placement ; LE exercises- X 20 - Left heel slides, right hip/knee flexion,bilateral  hip abduction ; bilateral SAQs, ankle pumps , hip extension; attempted sit to stand to RW - pt reports he is too tired to stand.   Dorann Davidson,JIM 01/01/2012, 8:15 AM

## 2012-01-01 NOTE — Progress Notes (Signed)
Patient ID: Jamie Romero, male   DOB: 12-01-1922, 78 y.o.   MRN: [id number] Subjective/Complaints: 10/19.  Slept well.eating breakfast. Pain under control. HEENT- neg; Chest- remains clear;CV- ni heart sounds; Abd- benign; Extr- L heel pad in place; s/p R BKA Nice glycemic and BP control  CBG (last 3)   Basename 01/01/12 0739 12/31/11 2039 12/31/11 1123  GLUCAP 83 195* 123*    BP Readings from Last 3 Encounters:  01/01/12 118/51  12/20/11 111/56  12/20/11 111/56   l   . A 12 point review of systems has been performed and if not noted above is otherwise negative.    Objective: Vital Signs: Blood pressure 118/51, pulse 63, temperature 98.2 F (36.8 C), temperature source Oral, resp. rate 18, height 5\' 6"  (1.676 m), weight 75.9 kg (167 lb 5.3 oz), SpO2 99.00%. No results found.  Basename 12/31/11 0640  WBC 3.4*  HGB 8.1*  HCT 24.7*  PLT 102*   No results found for this basename: NA:2,K:2,CL:2,CO2:2,GLUCOSE:2,BUN:2,CREATININE:2,CALCIUM:2 in the last 72 hours CBG (last 3)   Basename 01/01/12 0739 12/31/11 2039 12/31/11 1123  GLUCAP 83 195* 123*    Wt Readings from Last 3 Encounters:  12/27/11 75.9 kg (167 lb 5.3 oz)  12/16/11 69.446 kg (153 lb 1.6 oz)  12/16/11 69.446 kg (153 lb 1.6 oz)    Physical Exam:  Constitutional: He is oriented to person, place, and time.  Pleasant thin elderly male, NAD.  HENT:  Head: Normocephalic and atraumatic.  edentulous  Eyes: Pupils are equal, round, and reactive to light.  Neck: Normal range of motion.  Cardiovascular: Normal rate and regular rhythm.  Pulmonary/Chest: Effort normal and breath sounds normal.  Abdominal: Soft. Bowel sounds are normal. He exhibits no distension. There is no tenderness.  Musculoskeletal: He exhibits edema (2+ left pedal.).  R_BKA with ACE Neurological: He is alert and oriented to person, place, and time.  Decreased sensation left foot/leg in a stocking glove distribution.. Left foot drop and  weakness with eversion as well. LLE is grossly 4/5 except for affected areas which are grossly 1/5.  Skin: Skin is warm and dry. Rash gradually improving. Incision in tactwith minimal drainage serosanginous   Assessment/Plan: 1. Functional deficits secondary to right BKA  which require 3+ hours per day of interdisciplinary therapy in a comprehensive inpatient rehab setting. Physiatrist is providing close team supervision and 24 hour management of active medical problems listed below. Physiatrist and rehab team continue to assess barriers to discharge/monitor patient progress toward functional and medical goals. FIM: FIM - Bathing Bathing Steps Patient Completed: Chest;Right Arm;Left Arm;Abdomen;Front perineal area;Buttocks;Right upper leg;Left upper leg;Left lower leg (including foot) Bathing: 5: Supervision: Safety issues/verbal cues  FIM - Upper Body Dressing/Undressing Upper body dressing/undressing steps patient completed: Thread/unthread right sleeve of front closure shirt/dress;Thread/unthread left sleeve of front closure shirt/dress;Pull shirt around back of front closure shirt/dress;Button/unbutton shirt Upper body dressing/undressing: 5: Set-up assist to: Obtain clothing/put away FIM - Lower Body Dressing/Undressing Lower body dressing/undressing steps patient completed: Thread/unthread right underwear leg;Thread/unthread left underwear leg;Thread/unthread right pants leg;Thread/unthread left pants leg Lower body dressing/undressing: 3: Mod-Patient completed 50-74% of tasks (seated in w/c, at bed level independence increases)  FIM - Toileting Toileting steps completed by patient: Adjust clothing prior to toileting;Performs perineal hygiene;Adjust clothing after toileting Toileting: 0: Activity did not occur  FIM - Diplomatic Services operational officer Devices: Bedside commode;Sliding board (drop arm) Toilet Transfers: 0-Activity did not occur  FIM - Physiological scientist Devices: Sliding  board;Bed rails;Arm rests Bed/Chair Transfer: 6: Assistive device: no helper;6: Supine > Sit: No assist;5: Bed > Chair or W/C: Supervision (verbal cues/safety issues);5: Chair or W/C > Bed: Supervision (verbal cues/safety issues)  FIM - Locomotion: Wheelchair Distance: 150 Locomotion: Wheelchair: 5: Travels 150 ft or more: maneuvers on rugs and over door sills with supervision, cueing or coaxing FIM - Locomotion: Ambulation Locomotion: Ambulation Assistive Devices: Designer, industrial/product Ambulation/Gait Assistance: 1: +2 Total assist Locomotion: Ambulation: 1: Two helpers  Comprehension Comprehension Mode: Auditory Comprehension: 6-Follows complex conversation/direction: With extra time/assistive device  Expression Expression Mode: Verbal Expression: 6-Expresses complex ideas: With extra time/assistive device  Social Interaction Social Interaction: 6-Interacts appropriately with others with medication or extra time (anti-anxiety, antidepressant).  Problem Solving Problem Solving: 6-Solves complex problems: With extra time  Memory Memory: 6-More than reasonable amt of time  Medical Problem List and Plan:  1. DVT Prophylaxis/Anticoagulation: Pharmaceutical: Lovenox  2. Pain Management: Continue hydrocodone prn  3. Mood: team will provide egosupport as appropriate  4. Neuropsych: This patient is capable of making decisions on his/her own behalf.  5. DM type 2: Continue AC/HS cbg checks to keep BS controlled and help wound healing. Sugars normalizing with less am hypoglycemia in general--monitor for now 6. HTN: Monitor with bid checks. Continue terazosin, ranexa, lasix, and digoxin. Lisinopril discontinued due to low BP.  7. GERD: On protonix.  8. Iron deficiency anemia: continue iron supplements. Stool heme positive  Recheck today , and repeat check of stool 9. Hyponatremia: resolving 10. H/o angina: continue ranexa.  11.  Dermatitis: hydrocortisone cream.  LOS (Days) 12 A FACE TO FACE EVALUATION WAS PERFORMED  Rogelia Boga 01/01/2012, 9:16 AM

## 2012-01-02 ENCOUNTER — Inpatient Hospital Stay (HOSPITAL_COMMUNITY): Payer: Medicare Other

## 2012-01-02 LAB — GLUCOSE, CAPILLARY
Glucose-Capillary: 87 mg/dL (ref 70–99)
Glucose-Capillary: 117 mg/dL — ABNORMAL HIGH (ref 70–99)
Glucose-Capillary: 144 mg/dL — ABNORMAL HIGH (ref 70–99)

## 2012-01-02 NOTE — Progress Notes (Signed)
Occupational Therapy Session Note  Patient Details  Name: Jamie Romero MRN: [id number] Date of Birth: Nov 20, 1922  Today's Date: 01/02/2012 Time: 0800-0900 Time Calculation (min): 60 min  Short Term Goals: Week 2:  OT Short Term Goal 1 (Week 2): Short Term Goals = Long Term Goals  Skilled Therapeutic Interventions: ADL-retraining at walk-in shower with emphasis on sliding board transfers, weight-shifting, dynamic sitting balance, and effective use of DME.   Patient able to complete transfers with min assist to position sliding board and provide steadying assist as patient transferred out of bed and on/off shower seat.   Patient demonstrates good use of shower seat and grab bars for positioning on shower chair.   Bib overalls placed in drawer (not recommended for dressing activities).     Therapy Documentation Precautions:  Precautions Precautions: Fall Restrictions Weight Bearing Restrictions: Yes RLE Weight Bearing: Non weight bearing  Pain: Pain Assessment Pain Assessment: No/denies pain  See FIM for current functional status  Therapy/Group: Individual Therapy  Georgeanne Nim 01/02/2012, 10:56 AM

## 2012-01-02 NOTE — Progress Notes (Signed)
Patient ID: Jamie Romero, male   DOB: [birth year], 78 y.o.   MRN: [id number] Patient ID: Jamie Romero, male   DOB: 1923/03/13, 78 y.o.   MRN: [id number] Subjective/Complaints:  10/20.  Slept well.eating breakfast. Pain under control. No concerns  HEENT- neg; Chest- few rhonchi; quiet unlabored respirations; CV- ni heart sounds; Abd- benign; Extr- L heel pad in place; s/p R BKA Nice glycemic and BP control  CBG (last 3)   Basename 01/02/12 0737 01/01/12 2105 01/01/12 1709  GLUCAP 87 119* 81    BP Readings from Last 3 Encounters:  01/02/12 130/65  12/20/11 111/56  12/20/11 111/56   l   . A 12 point review of systems has been performed and if not noted above is otherwise negative.    Objective: Vital Signs: Blood pressure 130/65, pulse 67, temperature 98 F (36.7 C), temperature source Oral, resp. rate 18, height 5\' 6"  (1.676 m), weight 75.9 kg (167 lb 5.3 oz), SpO2 97.00%. No results found.  Basename 12/31/11 0640  WBC 3.4*  HGB 8.1*  HCT 24.7*  PLT 102*   No results found for this basename: NA:2,K:2,CL:2,CO2:2,GLUCOSE:2,BUN:2,CREATININE:2,CALCIUM:2 in the last 72 hours CBG (last 3)   Basename 01/02/12 0737 01/01/12 2105 01/01/12 1709  GLUCAP 87 119* 81    Wt Readings from Last 3 Encounters:  12/27/11 75.9 kg (167 lb 5.3 oz)  12/16/11 69.446 kg (153 lb 1.6 oz)  12/16/11 69.446 kg (153 lb 1.6 oz)    Physical Exam:  Constitutional: He is oriented to person, place, and time.  Pleasant thin elderly male, NAD.  HENT:  Head: Normocephalic and atraumatic.  edentulous  Eyes: Pupils are equal, round, and reactive to light.  Neck: Normal range of motion.  Cardiovascular: Normal rate and regular rhythm.  Pulmonary/Chest: Effort normal and breath sounds normal.  Abdominal: Soft. Bowel sounds are normal. He exhibits no distension. There is no tenderness.  Musculoskeletal: He exhibits edema (2+ left pedal.).  R_BKA with ACE Neurological: He is alert and oriented  to person, place, and time.  Decreased sensation left foot/leg in a stocking glove distribution.. Left foot drop and weakness with eversion as well. LLE is grossly 4/5 except for affected areas which are grossly 1/5.  Skin: Skin is warm and dry. Rash gradually improving. Incision in tactwith minimal drainage serosanginous   Assessment/Plan: 1. Functional deficits secondary to right BKA  which require 3+ hours per day of interdisciplinary therapy in a comprehensive inpatient rehab setting. Physiatrist is providing close team supervision and 24 hour management of active medical problems listed below. Physiatrist and rehab team continue to assess barriers to discharge/monitor patient progress toward functional and medical goals. FIM: FIM - Bathing Bathing Steps Patient Completed: Chest;Right Arm;Left Arm;Abdomen;Front perineal area;Buttocks;Right upper leg;Left upper leg;Left lower leg (including foot) Bathing: 5: Supervision: Safety issues/verbal cues  FIM - Upper Body Dressing/Undressing Upper body dressing/undressing steps patient completed: Thread/unthread right sleeve of front closure shirt/dress;Thread/unthread left sleeve of front closure shirt/dress;Pull shirt around back of front closure shirt/dress;Button/unbutton shirt Upper body dressing/undressing: 5: Set-up assist to: Obtain clothing/put away FIM - Lower Body Dressing/Undressing Lower body dressing/undressing steps patient completed: Thread/unthread right underwear leg;Thread/unthread left underwear leg;Thread/unthread right pants leg;Thread/unthread left pants leg Lower body dressing/undressing: 3: Mod-Patient completed 50-74% of tasks  FIM - Toileting Toileting steps completed by patient: Performs perineal hygiene Toileting Assistive Devices: Grab bar or rail for support Toileting: 2: Max-Patient completed 1 of 3 steps  FIM - Diplomatic Services operational officer Devices: Grab bars  Toilet Transfers: 3-To toilet/BSC: Mod  A (lift or lower assist)  FIM - Banker Devices: Sliding board Bed/Chair Transfer: 4: Chair or W/C > Bed: Min A (steadying Pt. > 75%)  FIM - Locomotion: Wheelchair Distance: 150 Locomotion: Wheelchair: 5: Travels 150 ft or more: maneuvers on rugs and over door sills with supervision, cueing or coaxing FIM - Locomotion: Ambulation Locomotion: Ambulation Assistive Devices: Designer, industrial/product Ambulation/Gait Assistance: 1: +2 Total assist Locomotion: Ambulation: 1: Two helpers  Comprehension Comprehension Mode: Auditory Comprehension: 6-Follows complex conversation/direction: With extra time/assistive device  Expression Expression Mode: Verbal Expression: 6-Expresses complex ideas: With extra time/assistive device  Social Interaction Social Interaction: 6-Interacts appropriately with others with medication or extra time (anti-anxiety, antidepressant).  Problem Solving Problem Solving: 6-Solves complex problems: With extra time  Memory Memory: 6-More than reasonable amt of time  Medical Problem List and Plan:  1. DVT Prophylaxis/Anticoagulation: Pharmaceutical: Lovenox  2. Pain Management: Continue hydrocodone prn  3. Mood: team will provide egosupport as appropriate  4. Neuropsych: This patient is capable of making decisions on his/her own behalf.  5. DM type 2: Continue AC/HS cbg checks to keep BS controlled and help wound healing. Sugars normalizing with less am hypoglycemia in general--monitor for now 6. HTN: Monitor with bid checks. Continue terazosin, ranexa, lasix, and digoxin. Lisinopril discontinued due to low BP.  7. GERD: On protonix.  8. Iron deficiency anemia: continue iron supplements. Stool heme positive  Recheck today , and repeat check of stool 9. Hyponatremia: resolving 10. H/o angina: continue ranexa.  11. Dermatitis: hydrocortisone cream.  LOS (Days) 13 A FACE TO FACE EVALUATION WAS PERFORMED  Rogelia Boga 01/02/2012, 8:35 AM

## 2012-01-03 ENCOUNTER — Encounter (HOSPITAL_COMMUNITY): Payer: Medicare Other | Admitting: Occupational Therapy

## 2012-01-03 ENCOUNTER — Inpatient Hospital Stay (HOSPITAL_COMMUNITY): Payer: Medicare Other

## 2012-01-03 LAB — CBC
MCHC: 31.9 g/dL (ref 30.0–36.0)
RDW: 19.7 % — ABNORMAL HIGH (ref 11.5–15.5)

## 2012-01-03 LAB — GLUCOSE, CAPILLARY
Glucose-Capillary: 128 mg/dL — ABNORMAL HIGH (ref 70–99)
Glucose-Capillary: 97 mg/dL (ref 70–99)

## 2012-01-03 MED ORDER — METHOCARBAMOL 500 MG PO TABS: 500.0000 mg | ORAL_TABLET | Freq: Four times a day (QID) | ORAL | Status: AC | PRN

## 2012-01-03 MED ORDER — GLIPIZIDE 10 MG PO TABS: 5.0000 mg | ORAL_TABLET | Freq: Every day | ORAL | Status: DC

## 2012-01-03 MED ORDER — TRAZODONE HCL 50 MG PO TABS: 25.0000 mg | ORAL_TABLET | Freq: Every day | ORAL | Status: DC

## 2012-01-03 MED ORDER — ASPIRIN EC 81 MG PO TBEC: 81.0000 mg | DELAYED_RELEASE_TABLET | Freq: Every day | ORAL | Status: AC

## 2012-01-03 MED ORDER — ACETAMINOPHEN 325 MG PO TABS: 325.0000 mg | ORAL_TABLET | ORAL | Status: AC | PRN

## 2012-01-03 NOTE — Progress Notes (Signed)
Physical Therapy Discharge Summary  Patient Details  Name: Jamie Romero MRN: [id number] Date of Birth: [birth year]  Today's Date: 01/03/2012 [phone] Time Calculation (min): 60 min Individual therapy Denies pain. Modified independent w/c mobility on unit and in home environment for functional mobility and endurance. Transfers with slide board with overall S on multiple surfaces (even and uneven) and bed mobility modified independent. Sit to stand attempted from w/c but too low of a surface; from elevated mat able to complete with min A. Mod A gait with RW x 10' x 2 with seated rest break between each gait trial. UE therex for strength and endurance on UE ergometer random program level 5 x 10 min (5 min forward and 5 min backwards). Pt denies any further questions and feels prepared for d/c later today.   Patient has met 7 of 7 long term goals due to improved activity tolerance, improved balance, increased strength and ability to compensate for deficits.  Patient to discharge at a wheelchair level Supervision using a slide board at this time to transfer.  Patient's son and daughter in law/family is independent to provide the necessary physical assistance at discharge. Both successfully completed family education regarding all aspects of mobility.  Reasons goals not met: All goals met at this time.  Recommendation:  Patient will benefit from ongoing skilled PT services in home health setting to continue to advance safe functional mobility, address ongoing impairments in gait, balance, activity tolerance, muscular endurance, and minimize fall risk.  Equipment: cushion and R amputee pad for personal w/c  Reasons for discharge: treatment goals met and discharge from hospital  Patient/family agrees with progress made and goals achieved: Yes  PT Discharge Precautions/Restrictions Precautions Precautions: Fall Restrictions Weight Bearing Restrictions: Yes RLE Weight Bearing: Non  weight bearing Pain Denies pain. Vision/Perception  Vision - History Baseline Vision: No visual deficits Patient Visual Report: No change from baseline Vision - Assessment Eye Alignment: Within Functional Limits Perception Perception: Within Functional Limits Praxis Praxis: Intact  Cognition Overall Cognitive Status: Appears within functional limits for tasks assessed Arousal/Alertness: Awake/alert Orientation Level: Oriented X4 Memory: Appears intact Awareness: Appears intact Problem Solving: Appears intact Safety/Judgment: Appears intact Sensation Sensation Light Touch: Impaired Detail Light Touch Impaired Details: Impaired RLE;Impaired LLE Additional Comments: patient continues to complain of minimal numbness & tingling throughout bilateral fingertips Coordination Gross Motor Movements are Fluid and Coordinated: Yes Fine Motor Movements are Fluid and Coordinated: Yes Motor  Motor Motor: Within Functional Limits (R BKA)  Ambulation Ambulation/Gait Assistance: 3: Mod assist  Trunk/Postural Assessment  Cervical Assessment Cervical Assessment: Within Functional Limits Thoracic Assessment Thoracic Assessment: Within Functional Limits (kyphosis) Lumbar Assessment Lumbar Assessment: Exceptions to [facility] (same as admission) Postural Control Postural Control: Within Functional Limits  Balance Balance Balance Assessed: Yes Static Sitting Balance Static Sitting - Level of Assistance: 6: Modified independent (Device/Increase time) Dynamic Sitting Balance Dynamic Sitting - Level of Assistance: 6: Modified independent (Device/Increase time) Static Standing Balance Static Standing - Level of Assistance: 4: Min assist (with bilateral UE support) Extremity Assessment  RUE Assessment RUE Assessment: Within Functional Limits (shoulder flexion ~100 degrees; pt reports wreak when young) LUE Assessment LUE Assessment: Within Functional Limits RLE Assessment RLE Assessment:  Exceptions to WFL (R BKA; ROM WFL and strength grossly 3+/5) LLE Assessment LLE Assessment: Exceptions to [facility] (decreased active DF; 3+/5 otherwise )  See FIM for current functional status  [name] [facility] 01/03/2012, 10:31 AM

## 2012-01-03 NOTE — Plan of Care (Signed)
Problem: RH BLADDER ELIMINATION Goal: RH STG MANAGE BLADDER WITH ASSISTANCE STG Manage Bladder With Assistance. Min A (urinal during the day. Condom cath at noc due to spillage)  Outcome: Not Met (add Reason) Pt has frequent incontinent episodes in brief.

## 2012-01-03 NOTE — Plan of Care (Signed)
Problem: RH Car Transfers Goal: LTG Patient will perform car transfers with assist (PT) LTG: Patient will perform car transfers with assistance (PT).  Outcome: Completed/Met Date Met:  01/03/12 Goal was for car (min A); SUV requires +2 assist

## 2012-01-03 NOTE — Progress Notes (Signed)
Patient ID: Jamie Romero, male   DOB: May 25, 1922, 78 y.o.   MRN: [id number] Patient ID: Jamie Romero, male   DOB: 07/15/1922, 78 y.o.   MRN: [id number] Subjective/Complaints: l   . A 12 point review of systems has been performed and if not noted above is otherwise negative.    Objective: Vital Signs: Blood pressure 114/59, pulse 70, temperature 98.2 F (36.8 C), temperature source Oral, resp. rate 20, height 5\' 6"  (1.676 m), weight 75.9 kg (167 lb 5.3 oz), SpO2 98.00%. No results found. No results found for this basename: WBC:2,HGB:2,HCT:2,PLT:2 in the last 72 hours No results found for this basename: NA:2,K:2,CL:2,CO2:2,GLUCOSE:2,BUN:2,CREATININE:2,CALCIUM:2 in the last 72 hours CBG (last 3)   Basename 01/02/12 2052 01/02/12 1637 01/02/12 1127  GLUCAP 144* 117* 132*    Wt Readings from Last 3 Encounters:  12/27/11 75.9 kg (167 lb 5.3 oz)  12/16/11 69.446 kg (153 lb 1.6 oz)  12/16/11 69.446 kg (153 lb 1.6 oz)    Physical Exam:  Constitutional: He is oriented to person, place, and time.  Pleasant thin elderly male, NAD.  HENT:  Head: Normocephalic and atraumatic.  edentulous  Eyes: Pupils are equal, round, and reactive to light.  Neck: Normal range of motion.  Cardiovascular: Normal rate and regular rhythm.  Pulmonary/Chest: Effort normal and breath sounds normal.  Abdominal: Soft. Bowel sounds are normal. He exhibits no distension. There is no tenderness.  Musculoskeletal: He exhibits edema (2+ left pedal.).  R_BKA with ACE Neurological: He is alert and oriented to person, place, and time.  Decreased sensation left foot/leg in a stocking glove distribution.. Left foot drop and weakness with eversion as well. LLE is grossly 4/5 except for affected areas which are grossly 1/5.  Skin: Skin is warm and dry. Rash gradually improving. Incision in tactwith minimal drainage serosanginous   Assessment/Plan: 1. Functional deficits secondary to right BKA  which require 3+  hours per day of interdisciplinary therapy in a comprehensive inpatient rehab setting. Physiatrist is providing close team supervision and 24 hour management of active medical problems listed below. Physiatrist and rehab team continue to assess barriers to discharge/monitor patient progress toward functional and medical goals. FIM: FIM - Bathing Bathing Steps Patient Completed: Chest;Right Arm;Left Arm;Abdomen;Front perineal area;Buttocks;Right upper leg;Left upper leg;Left lower leg (including foot) Bathing: 5: Supervision: Safety issues/verbal cues  FIM - Upper Body Dressing/Undressing Upper body dressing/undressing steps patient completed: Thread/unthread right sleeve of front closure shirt/dress;Thread/unthread left sleeve of front closure shirt/dress;Pull shirt around back of front closure shirt/dress Upper body dressing/undressing: 4: Min-Patient completed 75 plus % of tasks FIM - Lower Body Dressing/Undressing Lower body dressing/undressing steps patient completed: Pull underwear up/down Lower body dressing/undressing: 3: Mod-Patient completed 50-74% of tasks  FIM - Toileting Toileting steps completed by patient: Performs perineal hygiene Toileting Assistive Devices: Grab bar or rail for support Toileting: 2: Max-Patient completed 1 of 3 steps  FIM - Diplomatic Services operational officer Devices: Therapist, music Transfers: 3-To toilet/BSC: Mod A (lift or lower assist)  FIM - Banker Devices: Sliding board Bed/Chair Transfer: 4: Bed > Chair or W/C: Min A (steadying Pt. > 75%)  FIM - Locomotion: Wheelchair Distance: 150 Locomotion: Wheelchair: 5: Travels 150 ft or more: maneuvers on rugs and over door sills with supervision, cueing or coaxing FIM - Locomotion: Ambulation Locomotion: Ambulation Assistive Devices: Designer, industrial/product Ambulation/Gait Assistance: 1: +2 Total assist Locomotion: Ambulation: 1: Two  helpers  Comprehension Comprehension Mode: Auditory Comprehension: 6-Follows complex conversation/direction:  With extra time/assistive device  Expression Expression Mode: Verbal Expression: 6-Expresses complex ideas: With extra time/assistive device  Social Interaction Social Interaction: 6-Interacts appropriately with others with medication or extra time (anti-anxiety, antidepressant).  Problem Solving Problem Solving: 6-Solves complex problems: With extra time  Memory Memory: 6-More than reasonable amt of time  Medical Problem List and Plan:  1. DVT Prophylaxis/Anticoagulation: Pharmaceutical: Lovenox  2. Pain Management: Continue hydrocodone prn  3. Mood: team will provide egosupport as appropriate  4. Neuropsych: This patient is capable of making decisions on his/her own behalf.  5. DM type 2: Continue AC/HS cbg checks to keep BS controlled and help wound healing. Sugars normalizing with less am hypoglycemia in general--monitor for now 6. HTN: Monitor with bid checks. Continue terazosin, ranexa, lasix, and digoxin. Lisinopril discontinued due to low BP.  7. GERD: On protonix.  8. Iron deficiency anemia: continue iron supplements. Stool heme positive x2,but hgb stable on Friday  -recheck hgb today  -consider GI consult 9. Hyponatremia: resolving 10. H/o angina: continue ranexa.  11. Dermatitis: hydrocortisone cream.  LOS (Days) 14 A FACE TO FACE EVALUATION WAS PERFORMED  Shirl Weir T 01/03/2012, 7:23 AM

## 2012-01-03 NOTE — Progress Notes (Signed)
Pt discharged at 1315 with family to home. Belongings with pt. Discharge instructions given by Marissa Nestle, PA and RN. All questions answered at this time.

## 2012-01-03 NOTE — Progress Notes (Signed)
Social Work Discharge Note  The overall goal for the admission was met for:   Discharge location: Yes - home with son and daughter-in-law  Length of Stay: Yes - 14 days  Discharge activity level: Yes - minimal assistance overall  Home/community participation: Yes  Services provided included: MD, RD, PT, OT, RN, TR, Pharmacy and SW  Financial Services: Medicare  Follow-up services arranged: Home Health: RN, PT, OT, CNA via [facility], DME: hospital bed, drop arm commode, tub bench, transfer board via [facility] plus, via [facility], amputee support pad and cushion added to pt's existing w/c and Patient/Family request agency HH: [facility], DME: NA  Comments (or additional information): also provided information on our local Amputee Support Group (closest meeting to pt's home)  Patient/Family verbalized understanding of follow-up arrangements: Yes  Individual responsible for coordination of the follow-up plan: patient  Confirmed correct DME delivered: Jamie Romero 01/03/2012    Jamie Romero

## 2012-01-03 NOTE — Progress Notes (Addendum)
Occupational Therapy Discharge Summary & Session Notes  Patient Details  Name: SHAUNAK KREIS MRN: [id number] Date of Birth: [birth year]  Today's Date: 01/03/2012  DISCHARGE SUMMARY Patient has met 6 of 8 long term goals due to improved activity tolerance, improved balance, postural control, ability to compensate for deficits, improved attention, improved awareness and improved coordination.  Patient to discharge at overall Supervision-min assist level.  Patient's care partners are independent to provide the necessary physical and supervision assistance  needed at discharge.    Reasons goals not met: Patient did not meet supervision toileting goal or supervision toilet transfer goal at this time. Currently, patient requires minimal assistance for toilet transfers and moderate assistance with toileting. Patient uses slide board for transfers and requires steady assist for safety and to hold LLE on floor.   Recommendation:  Patient will benefit from ongoing skilled OT services in home health setting to continue to advance functional skills in the area of BADL, iADL and Reduce care partner burden.  Equipment: tub transfer bench and drop arm BSC  Reasons for discharge: treatment goals met and discharge from hospital  Patient/family agrees with progress made and goals achieved: Yes  Precautions/Restrictions  Precautions Precautions: Fall Restrictions Weight Bearing Restrictions: Yes RLE Weight Bearing: Non weight bearing  Vital Signs Therapy Vitals Temp: 98.2 F (36.8 C) Temp src: Oral Pulse Rate: 70  Resp: 20  BP: 114/59 mmHg Patient Position, if appropriate: Lying Oxygen Therapy SpO2: 98 % O2 Device: None (Room air)  Pain Pain Assessment Pain Assessment: 0-10 Pain Score:   5 Pain Type: Acute pain Pain Location: Foot Pain Orientation: Left Pain Descriptors: Aching ("feels like you put a cigarette to it every once in awhile") Pain Intervention(s): RN made  aware;Repositioned (patient refused pain meds)  ADL - See FIM  Vision/Perception  Vision - History Baseline Vision: No visual deficits Patient Visual Report: No change from baseline Vision - Assessment Eye Alignment: Within Functional Limits Perception Perception: Within Functional Limits Praxis Praxis: Intact   Cognition Overall Cognitive Status: Appears within functional limits for tasks assessed Arousal/Alertness: Awake/alert Orientation Level: Oriented X4 Memory: Appears intact Awareness: Appears intact Problem Solving: Appears intact Safety/Judgment: Appears intact  Sensation Sensation Additional Comments: patient continues to complain of minimal numbness & tingling throughout bilateral fingertips Coordination Gross Motor Movements are Fluid and Coordinated: Yes Fine Motor Movements are Fluid and Coordinated: Yes  Sensation  Sensation  Light Touch: Impaired Detail  Light Touch Impaired Details: Impaired RLE;Impaired LLE  Additional Comments: patient continues to complain of minimal numbness & tingling throughout bilateral fingertips  Coordination  Gross Motor Movements are Fluid and Coordinated: Yes  Fine Motor Movements are Fluid and Coordinated: Yes   Motor  Motor  Motor: Within Functional Limits (R BKA)  Ambulation  Ambulation/Gait Assistance: 3: Mod assist   Trunk/Postural Assessment  Cervical Assessment  Cervical Assessment: Within Functional Limits  Thoracic Assessment  Thoracic Assessment: Within Functional Limits (kyphosis)  Lumbar Assessment  Lumbar Assessment: Exceptions to [facility] (same as admission)  Postural Control  Postural Control: Within Functional Limits   Balance  Balance  Balance Assessed: Yes  Static Sitting Balance  Static Sitting - Level of Assistance: 6: Modified independent (Device/Increase time)  Dynamic Sitting Balance  Dynamic Sitting - Level of Assistance: 6: Modified independent (Device/Increase time)  Static Standing  Balance  Static Standing - Level of Assistance: 4: Min assist (with bilateral UE support)  Extremity/Trunk Assessment RUE Assessment RUE Assessment: Within Functional Limits (shoulder flexion ~100 degrees; pt reports  wreak when young) LUE Assessment LUE Assessment: Within Functional Limits  See FIM for current functional status  ----------------------------------------------------------------------------------------  SESSION NOTES  Session #1 [id number] - 55 Minutes Individual Therapy Patient with 5/10 complaints of pain in left foot; RN aware, but patient refused medication at this time.  Patient found supine in bed. Patient engaged in UB/LB bathing & dressing at bed level. Focused skilled intervention on bed mobility, lateral leans to pull pants up to waist, overall activity tolerance/endurance, and transfer from edge of bed -> w/c. Patient requested to perform squat pivot transfer from bed -> w/c; discussed with patient therapy recommendation of using slide board at home for transfers. Once in w/c patient sat at sink to perform grooming tasks. At end of session left patient seated in w/c beside bed with call bell & phone within reach.   Session #2 [id number] - 25 Minutes Individual Therapy No complaints pain Patient found seated in w/c beside bed. Patient propelled self out of room and mentioned bleeding on hand; notified RN. Patient propelled self -> ADL apartment for simulated drop arm BSC transfer using slide board with minimal assistance from therapist. Patient performed w/c management of doffing & donning leg rests during transfer. Patient then engaged in w/c pushups; 3 sets of 10 reps. At end of session therapist propelled patient back to room and left seated in w/c with call bell & phone within reach.   Jones Viviani 01/03/2012, 7:50 AM

## 2012-01-05 NOTE — Progress Notes (Signed)
Discharge summary [phone]

## 2012-01-06 NOTE — Discharge Summary (Signed)
Jamie Romero, [name] NO.:  [id number]  MEDICAL RECORD NO.:  [id number]  LOCATION:  4006                         FACILITY:  [facility]  PHYSICIAN:  Delle Reining, P.A.      DATE OF BIRTH:  [birth year]  DATE OF ADMISSION:  12/20/2011 DATE OF DISCHARGE:  01/03/2012                              DISCHARGE SUMMARY   DISCHARGE DIAGNOSES: 1. Peripheral vascular disease with right foot wet gangrene and     osteomyelitis requiring right below-knee amputation. 2. Acute blood loss anemia. 3. Diabetes mellitus type 2. 4. Hypertension. 5. Rash, resolved.  HISTORY OF PRESENT ILLNESS:  Jamie Romero is an 78 year old male with history of diabetes mellitus, peripheral vascular disease with diabetic foot ulcers, who was admitted on December 15, 2011, with fever, chills, and nausea due to right foot infection.  He was noted to have wet gangrene with calcaneal osteomyelitis and Charcot foot.  He was  noted to be anemic with hemoglobin at 6.8 and was transfused with 3 units packed red blood cells.  He was started on IV vanc in addition to his doxycycline and underwent right BKA on December 16, 2011, by Dr. Victorino Dike.  Postop, he developed rash likely antibiotic related. Cefepime, vancomycin, and doxycycline have been discontinued.  Therapies were initiated and the patient was noted to have limitations in mobility and self-care.  Therapy team recommended CIR for progression.  PAST MEDICAL HISTORY: 1. Hypertension. 2. Hypertension. 3. Valvular heart disease. 4. Pneumonia. 5. Type 2 diabetes mellitus. 6. Iron deficiency anemia. 7. Arthritis. 8. History of angina.  FUNCTIONAL HISTORY:  The patient was independent but sedentary prior to admission.  He used wheelchair for mobility.  FUNCTIONAL STATUS:  The patient is +2 total assist for sit-to-stand transfers, +2 total assist 50% for stand pivot transfers.  Gait not tested.  He required max assist for lower body dressing, +2 total  assist for toileting transfers.  RECENT LABS:  Check of lytes from December 29, 2011, revealed sodium 134, potassium 4.8, chloride 106, CO2 of 25, BUN 18, creatinine 0.86, glucose 94.  CBC from January 03, 2012, revealed hemoglobin 8.3, hematocrit 26.0, white count 3.0, platelets 92.  Stool guaiacs x2 checked were positive.  HOSPITAL COURSE:  Mr. Jamie Romero was admitted to Rehab on December 20, 2011, for inpatient therapies to consist of PT, OT at least 3 hours 5 days a week.  Past admission physiatrist, rehab, RN, and therapy team have worked together to provide customized collaborative interdisciplinary care.  The patient's pain was reasonably controlled with p.r.n. meds.  He is noted to have poor p.o. intake and nutritional supplements were added to help with protein stores and wound healing. Labs done revealed the patient with acute blood loss anemia and he was maintained on iron supplements throughout his stay.  His H and H has been stable in the 8.3 to 8.9 range.  He was noted to have 2 heme- positive stools, however, no GI symptoms reported.  The patient and family advised to follow up with primary care regarding further workup as indicated.  Daughter did report the patient has had hemorrhoids in the past and that they were not interested in any aggressive workup  at this time.  The patient's BKA site has been healing well without any signs or symptoms of infection.   During the patient's stay in rehab, weekly team conferences were held to monitor the patient's progress, set goals, and discuss barriers to discharge.  At the time of discharge, the patient was supervision for sliding board transfers.  He was mod assist for ambulating 10 feet x2 with rolling walker with seated rest breaks between the each trial.  Family education was done with son and daughter regarding assistance needed with mobility and self-care.  The patient is currently overall at supervision to min assist  level for his ADL tasks. He requires min assist for toilet transfers and mod assist for toileting.  Further followup home health therapies to continue past discharge.  DISCHARGE MEDICATIONS: 1. Tylenol 325-650 mg p.o. q.i.d. p.r.n. pain. 2. Coated aspirin 81 mg p.o. per day. 3. Flexeril 5 mg p.o. t.i.d. p.r.n. spasm. 4. Trazodone 50 mg half p.o. at bedtime. 5. Glipizide 10 mg half p.o. per day. 6. Atorvastatin 10 mg p.o. per day. 7. Lanoxin 0.125 mg p.o. per day. 8. Nutritional supplements t.i.d. 9. Ranexa 500 mg p.o. b.i.d. 10.Multivitamin 1 p.o. per day. 11.Terazosin 10 mg p.o. at bedtime. 12.Vitamin C 500 mg p.o. per day. 13.Vitamin E 400 units p.o. per day. 14.Zinc sulfate 220 mg p.o. per day.  DIET:  Carb-modified medium.  ACTIVITY LEVEL:  As tolerated with 24-hour supervision and assistance.  SPECIAL INSTRUCTIONS:  Do not use plain aspirin, baclofen, or Norco. [facility] [place] is to provide PT, OT, RN, and CNA.  Follow up with primary MD for workup for anemia and heme-positive stools.     Delle Reining, P.A.     PL/MEDQ  D:  01/05/2012  T:  01/06/2012  Job:  [id number]  cc:   Toni Arthurs, MD [name], MD

## 2012-01-10 ENCOUNTER — Ambulatory Visit (INDEPENDENT_AMBULATORY_CARE_PROVIDER_SITE_OTHER): Payer: Medicare Other | Admitting: Family

## 2012-01-10 ENCOUNTER — Encounter (HOSPITAL_BASED_OUTPATIENT_CLINIC_OR_DEPARTMENT_OTHER): Payer: Medicare Other

## 2012-01-10 ENCOUNTER — Encounter: Payer: Self-pay | Admitting: Family

## 2012-01-10 VITALS — BP 140/60 | HR 86 | Temp 97.9°F

## 2012-01-10 DIAGNOSIS — G546 Phantom limb syndrome with pain: Secondary | ICD-10-CM

## 2012-01-10 DIAGNOSIS — Z89511 Acquired absence of right leg below knee: Secondary | ICD-10-CM

## 2012-01-10 DIAGNOSIS — K219 Gastro-esophageal reflux disease without esophagitis: Secondary | ICD-10-CM

## 2012-01-10 DIAGNOSIS — S88119A Complete traumatic amputation at level between knee and ankle, unspecified lower leg, initial encounter: Secondary | ICD-10-CM

## 2012-01-10 DIAGNOSIS — G547 Phantom limb syndrome without pain: Secondary | ICD-10-CM

## 2012-01-10 DIAGNOSIS — D649 Anemia, unspecified: Secondary | ICD-10-CM

## 2012-01-10 LAB — CBC WITH DIFFERENTIAL/PLATELET
Eosinophils Relative: 5.5 % — ABNORMAL HIGH (ref 0.0–5.0)
HCT: 24.4 % — ABNORMAL LOW (ref 39.0–52.0)
Lymphs Abs: 0.8 10*3/uL (ref 0.7–4.0)
MCV: 87 fl (ref 78.0–100.0)
Monocytes Absolute: 0.3 10*3/uL (ref 0.1–1.0)
Neutro Abs: 1.7 10*3/uL (ref 1.4–7.7)
Platelets: 127 10*3/uL — ABNORMAL LOW (ref 150.0–400.0)
RDW: 22.7 % — ABNORMAL HIGH (ref 11.5–14.6)
WBC: 3.1 10*3/uL — ABNORMAL LOW (ref 4.5–10.5)

## 2012-01-10 MED ORDER — PANTOPRAZOLE SODIUM 40 MG PO TBEC: 40.0000 mg | DELAYED_RELEASE_TABLET | Freq: Two times a day (BID) | ORAL | Status: DC

## 2012-01-10 NOTE — Progress Notes (Signed)
Subjective:    Patient ID: Jamie Romero, male    DOB: April 30, 1922, 78 y.o.   MRN: [id number]  HPI 78 year old white male, nonsmoker, patient of Dr. Cato Mulligan is in post hospital follow-up after a right below the knee amputation. He is doing well physically and emotionally. Has phantom limb pain that is well controlled with methocarbamol. Pain is intermittent, 4/10, and varies in frequency. Has staples in his stump that is healing well. He had anemia in the hospital and needs to be rechecked today. No fatigue.    Review of Systems  Constitutional: Negative.   Respiratory: Negative.   Cardiovascular: Negative.   Gastrointestinal: Negative.   Musculoskeletal: Negative.        Right below the knee amputation.   Skin: Negative.   Neurological: Negative.   Hematological: Negative.   Psychiatric/Behavioral: Negative.    Past Medical History  Diagnosis Date  . Valvular heart disease   . GERD (gastroesophageal reflux disease)   . Hypertension   . Hyperlipidemia   . Complication of anesthesia     "had problem extubating him after heart surgery; don't recall why" (12/15/2011)  . Peripheral vascular disease   . Anginal pain     "used to" (12/15/2011)  . Pneumonia 11/2011    "2nd bout w/it" (12/15/2011)  . Type II diabetes mellitus   . Iron (Fe) deficiency anemia   . History of blood transfusion   . Arthritis     "joints" (12/15/2011)  . Diabetic foot ulcer     right foot (12/15/2011)    History   Social History  . Marital Status: Widowed    Spouse Name: N/A    Number of Children: N/A  . Years of Education: N/A   Occupational History  . Not on file.   Social History Main Topics  . Smoking status: Former Smoker -- 3 years    Types: Cigars  . Smokeless tobacco: Current User    Types: Chew   Comment: 12/15/2011 "quit smoking in my early 20's; smoked 50 cigars/wk"  . Alcohol Use: No  . Drug Use: No  . Sexually Active: Not Currently   Other Topics Concern  . Not on file    Social History Narrative  . No narrative on file    Past Surgical History  Procedure Date  . Aortic valve replacement ?1990's    porcine  . Trauma with skull fracture ?late 1950's    "has plate in his head"  . Cardiac valve replacement   . Knee arthroscopy     "took cartilage out; both knees"  . Penectomy 2013    "it had grown over; had to reopen"  . Amputation 12/16/2011    Procedure: AMPUTATION BELOW KNEE;  Surgeon: Toni Arthurs, MD;  Location: [facility] OR;  Service: Orthopedics;  Laterality: Right;    Family History  Problem Relation Age of Onset  . Diabetes Sister   . Cancer Sister   . Cancer Brother     No Known Allergies  Current Outpatient Prescriptions on File Prior to Visit  Medication Sig Dispense Refill  . acetaminophen (TYLENOL) 325 MG tablet Take 1-2 tablets (325-650 mg total) by mouth every 4 (four) hours as needed.      . Ascorbic Acid (VITAMIN C) 500 MG tablet Take 500 mg by mouth daily.        Marland Kitchen aspirin EC 81 MG tablet Take 1 tablet (81 mg total) by mouth daily.      Marland Kitchen atorvastatin (LIPITOR) 10  MG tablet Take 1 tablet (10 mg total) by mouth daily.  30 tablet  5  . digoxin (LANOXIN) 0.125 MG tablet Take 1 tablet (125 mcg total) by mouth daily.  30 tablet  5  . feeding supplement (GLUCERNA SHAKE) LIQD Take 237 mLs by mouth 3 (three) times daily between meals.  1 Can  10  . ferrous sulfate 325 (65 FE) MG tablet Take 1 tablet (325 mg total) by mouth 2 (two) times daily.  60 tablet  5  . furosemide (LASIX) 20 MG tablet Take 1 tablet (20 mg total) by mouth daily.  30 tablet  5  . glipiZIDE (GLUCOTROL) 10 MG tablet Take 0.5 tablets (5 mg total) by mouth daily.  30 tablet  5  . hydrocortisone cream 0.5 % Apply topically 2 (two) times daily.  30 g  1  . methocarbamol (ROBAXIN) 500 MG tablet Take 1 tablet (500 mg total) by mouth every 6 (six) hours as needed. For muscle spasms.  30 tablet  0  . Multiple Vitamin (MULTIVITAMIN WITH MINERALS) TABS Take 1 tablet by mouth  daily.      . ranolazine (RANEXA) 500 MG 12 hr tablet Take 1 tablet (500 mg total) by mouth 2 (two) times daily.  60 tablet  5  . terazosin (HYTRIN) 10 MG capsule Take 1 capsule (10 mg total) by mouth at bedtime.  30 capsule  5  . traZODone (DESYREL) 50 MG tablet Take 0.5 tablets (25 mg total) by mouth at bedtime. For insomnia.  15 tablet  1  . vitamin E 400 UNIT capsule Take 400 Units by mouth daily.        Marland Kitchen zinc sulfate (ZINCATE) 220 MG capsule Take 1 capsule (220 mg total) by mouth daily.  30 capsule  5  . DISCONTD: pantoprazole (PROTONIX) 40 MG tablet Take 1 tablet (40 mg total) by mouth 2 (two) times daily before a meal.  60 tablet  0    BP 140/60  Pulse 86  Temp 97.9 F (36.6 C) (Oral)  SpO2 99%chart    Objective:   Physical Exam  Constitutional: He is oriented to person, place, and time. He appears well-developed and well-nourished.  Neck: Normal range of motion. Neck supple.  Cardiovascular: Normal rate, regular rhythm and normal heart sounds.   Pulmonary/Chest: Effort normal and breath sounds normal.  Abdominal: Soft. Bowel sounds are normal.  Musculoskeletal: Normal range of motion.       Right below the knee amputation. Stump site is healing well. Staples intact. It is well approximated. No drainage or discharge. No signs and symptoms of infection.  Neurological: He is alert and oriented to person, place, and time.  Skin: Skin is warm and dry. No erythema.  Psychiatric: He has a normal mood and affect.          Assessment & Plan:  Assessment: Below the Knee Amputation, Anemia, Phantom Limb Pain  Plan: Continue current mediations. CBC sent. Call the office with any questions or concerns. Recheck in 4 months and sooner as needed.

## 2012-01-10 NOTE — Patient Instructions (Signed)
Phantom Limb Pain Phantom limb pain is the pain that occurs in an extremity following an amputation. It is pain in an extremity (arm or leg) that no longer exists. This pain varies with different amputees. Different activities may precipitate the pain. Some amputees experience the opposite of phantom pain - phantom pleasure.  The trouble may start in a part of the brain known as the sensory cortex. The sensory cortex is the portion of your brain which feels sensations (feelings) from the rest of your body. Perhaps when a body part is lost the corresponding part of the brain is not able to handle the loss and rewires its circuitry to make up for the signals it was no longer receiving from the missing part. The exact mechanism of how phantom limb pain occurs is not known. The severity of pain seems to be correlated with personal problems such as, stress and attitudes. It also seems to correlate with the amount of pain before the operation. Phantom limb pain can be severe and debilitating. There are a number of different therapies that may give relief. Do not be disheartened if you do not obtain immediate relief. Keep working with your caregiver or with a pain center until relief is obtained. Some treatments that may be helpful include:  Hypnosis and mental imagery - their techniques can give patients the needed impetus to recognize their ability to regain control.  Biofeedback.  Relaxation techniques are related to hypnosis techniques by using the mind and body to control pain. See your caregiver and work with them if relief is not obtained. Document Released: 05/22/2002 Document Revised: 05/24/2011 Document Reviewed: 03/01/2005 [employer] Patient Information 2013 [employer].  

## 2012-01-11 ENCOUNTER — Telehealth: Payer: Self-pay

## 2012-01-11 DIAGNOSIS — D649 Anemia, unspecified: Secondary | ICD-10-CM

## 2012-01-11 NOTE — Telephone Encounter (Signed)
Pt's daughter-in-law aware and referral ordered

## 2012-01-11 NOTE — Telephone Encounter (Signed)
Message copied by Beverely Low on Tue Jan 11, 2012 11:54 AM ------      Message from: Adline Mango B      Created: Mon Jan 10, 2012  1:33 PM       Persistent anemia. Has he seen hematology?

## 2012-01-20 ENCOUNTER — Telehealth: Payer: Self-pay | Admitting: Hematology & Oncology

## 2012-01-20 NOTE — Telephone Encounter (Signed)
Pt aware of 11-12 appointment

## 2012-01-25 ENCOUNTER — Ambulatory Visit (HOSPITAL_BASED_OUTPATIENT_CLINIC_OR_DEPARTMENT_OTHER): Payer: Medicare Other | Admitting: Hematology & Oncology

## 2012-01-25 ENCOUNTER — Other Ambulatory Visit (HOSPITAL_BASED_OUTPATIENT_CLINIC_OR_DEPARTMENT_OTHER): Payer: Medicare Other | Admitting: Lab

## 2012-01-25 ENCOUNTER — Ambulatory Visit: Payer: Medicare Other

## 2012-01-25 VITALS — BP 131/51 | HR 65 | Temp 97.6°F | Resp 18 | Ht 66.0 in | Wt 153.0 lb

## 2012-01-25 DIAGNOSIS — E119 Type 2 diabetes mellitus without complications: Secondary | ICD-10-CM

## 2012-01-25 DIAGNOSIS — D509 Iron deficiency anemia, unspecified: Secondary | ICD-10-CM

## 2012-01-25 DIAGNOSIS — D510 Vitamin B12 deficiency anemia due to intrinsic factor deficiency: Secondary | ICD-10-CM

## 2012-01-25 DIAGNOSIS — D61818 Other pancytopenia: Secondary | ICD-10-CM

## 2012-01-25 LAB — CBC WITH DIFFERENTIAL (CANCER CENTER ONLY)
BASO%: 0.3 % (ref 0.0–2.0)
EOS%: 3.1 % (ref 0.0–7.0)
LYMPH%: 21.6 % (ref 14.0–48.0)
MCHC: 31.7 g/dL — ABNORMAL LOW (ref 32.0–35.9)
MCV: 92 fL (ref 82–98)
MONO#: 0.4 10*3/uL (ref 0.1–0.9)
MONO%: 9.8 % (ref 0.0–13.0)
Platelets: 98 10*3/uL — ABNORMAL LOW (ref 145–400)
RDW: 19.8 % — ABNORMAL HIGH (ref 11.1–15.7)
WBC: 3.6 10*3/uL — ABNORMAL LOW (ref 4.0–10.0)

## 2012-01-25 LAB — CHCC SATELLITE - SMEAR

## 2012-01-25 NOTE — Progress Notes (Signed)
.  dict

## 2012-01-26 ENCOUNTER — Other Ambulatory Visit: Payer: Self-pay | Admitting: Hematology & Oncology

## 2012-01-26 ENCOUNTER — Telehealth: Payer: Self-pay | Admitting: Hematology & Oncology

## 2012-01-26 DIAGNOSIS — D509 Iron deficiency anemia, unspecified: Secondary | ICD-10-CM

## 2012-01-26 DIAGNOSIS — D61818 Other pancytopenia: Secondary | ICD-10-CM

## 2012-01-26 NOTE — Telephone Encounter (Signed)
Pt aware of 11-26 BMBX and 12-2 MD appointments

## 2012-01-26 NOTE — Progress Notes (Signed)
CC:   Jamie H. Swords, MD  DIAGNOSIS:  Pancytopenia.  HISTORY OF PRESENT ILLNESS:  Jamie Romero is an 78 year old gentleman. He has multiple medical problems.  He just had an amputation of his right lower leg.  This was because of, I think, diabetic ulcer.  He was just discharged back on October 24th.  He was hospitalized for 2 weeks.  He is being sent to the [facility] because of "pancytopenia."  He is quite frail.  His performance status probably is ECOG 3.  When he was admitted back in early October, his white cell count was 7.7, hemoglobin 9.2, hematocrit 27.7, platelet count was 132.  His BUN and creatinine were 18 and 1.0.  Apparently, there were some blood loss issues.  He really cannot give much in the way of history.  His daughter is with him.  He has had no fever.  His appetite is okay.  There has been some weight loss.  He has had a decent appetite.  He is not a vegetarian.  PAST MEDICAL HISTORY: 1. Valvular heart disease. 2. GERD. 3. Hypertension. 4. Hyperlipidemia. 5. Non-insulin-dependent diabetes, status post right lower leg     amputation. 6. Peripheral vascular disease.  ALLERGIES:  None.  MEDICATIONS:  Aspirin 81 mg p.o. daily, Lipitor 10 mg p.o. daily, digoxin 0.125 mg p.o. daily, iron 325 mg p.o. b.i.d., Lasix 20 mg p.o. daily, Glucotrol 5 mg p.o. daily, lisinopril 5 mg p.o. daily, Robaxin 500 mg p.o. q.6 hours p.r.n., Protonix 40 mg p.o. b.i.d., Ranexa 500 mg p.o. b.i.d., Hytrin 10 mg p.o. q.h.s.,  Demadex 5 mg p.o. q.h.s. p.r.n., Desyrel 25 mg p.o. q.h.s. p.r.n.  SOCIAL HISTORY:  Negative for current tobacco use.  There is rare alcohol use.  There is no obvious occupational exposure.  FAMILY HISTORY:  Remarkable for a "cancer" and diabetes.  REVIEW OF SYSTEMS:  As stated in history of present illness.  No additional findings are noted on a 12-system review.  PHYSICAL EXAMINATION:  General:  This is an elderly, frail  white gentleman in no obvious distress.  Vital signs:  97.6, pulse 65, respiratory rate 18, blood pressure 131/51.  Weight is 153.  Head and neck:  Normocephalic, atraumatic skull.  He has temporal muscle wasting. There are no oral lesions.  There is no scleral icterus.  There is no adenopathy in his neck.  Lungs:  Decreased breath sounds bilaterally. He has good air movement bilaterally.  Cardiac:  Regular rate and rhythm with a normal S1 and S2.  He has an occasional extra beat.  He has no murmurs, rubs, or bruits.  Abdomen:  Soft with good bowel sounds.  There is no palpable abdominal mass.  There is no palpable hepatosplenomegaly. Extremities:  Right BKA.  Left leg is unremarkable.  Skin:  Some scattered ecchymoses.  Neurological:  No focal neurological deficits.  LABORATORIES:  White cell count is 3.6, hemoglobin 8.3, hematocrit 26.2, platelet count 98,000.  MCV is 92.  Peripheral smear shows a normochromic, normocytic population of red blood cells.  There are no nucleated red blood cells.  He has no teardrop cells.  I see no rouleaux formation.  White cells are mildly decreased in number.  There are no immature myeloid cells.  There are no atypical lymphocytes.  He has no hypersegmented polys.  There are no blasts.  Platelets are slightly decreased in number.  IMPRESSION:  Jamie Romero is an 78 year old gentleman with pancytopenia.  This is moderate.  I  just wonder if a lot of this is not from him being in the hospital. He did have the blood loss.  He had surgery.  It just may take a while for his bone marrow to recover.  I do think, however, that we probably need to investigate a little bit more thoroughly.  He probably does need to have a bone marrow test done. I suppose he certainly could have myelodysplasia.  We are checking his erythropoietin level.  I do not see iron deficiency or B12/folate as a problem.  He is on a lot of medications.  These also could be causing  some marrow suppression.  I am sure he was probably on antibiotics while he was in the hospital. These also could be playing a role with his blood counts.  He came in with his daughter.  Jamie Romero is a real nice guy.  He is a Cytogeneticist.  I had a good time talking with him.  We will got ahead and set up a bone marrow test for 2 weeks.  Then we will get him back and see how we can help him out.  Hopefully, we will find that his blood counts will improve as he gets further away from this episode that he had in the hospital and having his right lower leg taken off.  I spent a good hour or so with Jamie Romero and his daughter.  Again, he is a real nice guy.    ______________________________ Jamie Romero, M.D. PRE/MEDQ  D:  01/25/2012  T:  01/26/2012  Job:  [id number]

## 2012-01-26 NOTE — Telephone Encounter (Signed)
I left a message for his dgtr.  I told her that we might be able to avoid doing a bone marrow bx.  I found that his iron levels are low and that we could try giving him back iron be IV and then seeing how his blood counts respond.  I told her to call our office and let us know that she got the message.  I will set up the iron for him for next week.  I want to see him back about 4 weeks after the iron is given.  Pete E.

## 2012-01-27 ENCOUNTER — Telehealth: Payer: Self-pay | Admitting: Hematology & Oncology

## 2012-01-27 LAB — PROTEIN ELECTROPHORESIS, SERUM, WITH REFLEX
Albumin ELP: 46 % — ABNORMAL LOW (ref 55.8–66.1)
Total Protein, Serum Electrophoresis: 5.6 g/dL — ABNORMAL LOW (ref 6.0–8.3)

## 2012-01-27 LAB — RETICULOCYTES (CHCC): ABS Retic: 56.6 10*3/uL (ref 19.0–186.0)

## 2012-01-27 LAB — COMPREHENSIVE METABOLIC PANEL
ALT: 10 U/L (ref 0–53)
AST: 21 U/L (ref 0–37)
Albumin: 2.7 g/dL — ABNORMAL LOW (ref 3.5–5.2)
Alkaline Phosphatase: 85 U/L (ref 39–117)
Calcium: 8.8 mg/dL (ref 8.4–10.5)
Chloride: 109 mEq/L (ref 96–112)
Potassium: 3.7 mEq/L (ref 3.5–5.3)
Sodium: 143 mEq/L (ref 135–145)

## 2012-01-27 LAB — VITAMIN B12: Vitamin B-12: 734 pg/mL (ref 211–911)

## 2012-01-27 LAB — FERRITIN: Ferritin: 140 ng/mL (ref 22–322)

## 2012-01-27 LAB — LACTATE DEHYDROGENASE: LDH: 165 U/L (ref 94–250)

## 2012-01-27 LAB — IRON AND TIBC
%SAT: 18 % — ABNORMAL LOW (ref 20–55)
TIBC: 211 ug/dL — ABNORMAL LOW (ref 215–435)

## 2012-01-27 NOTE — Telephone Encounter (Signed)
Pt aware of 11-22 iron infusion and 12-20 MD appointment

## 2012-02-02 ENCOUNTER — Encounter: Payer: Medicare Other | Admitting: Physical Medicine & Rehabilitation

## 2012-02-04 ENCOUNTER — Ambulatory Visit (HOSPITAL_BASED_OUTPATIENT_CLINIC_OR_DEPARTMENT_OTHER): Payer: Medicare Other

## 2012-02-04 VITALS — BP 144/62 | HR 68 | Temp 96.8°F | Resp 20

## 2012-02-04 DIAGNOSIS — D649 Anemia, unspecified: Secondary | ICD-10-CM

## 2012-02-04 DIAGNOSIS — D509 Iron deficiency anemia, unspecified: Secondary | ICD-10-CM

## 2012-02-04 MED ORDER — SODIUM CHLORIDE 0.9 % IV SOLN
Freq: Once | INTRAVENOUS | Status: AC
  Administered 2012-02-04: 14:00:00 via INTRAVENOUS

## 2012-02-04 MED ORDER — SODIUM CHLORIDE 0.9 % IV SOLN
1020.0000 mg | Freq: Once | INTRAVENOUS | Status: AC
  Administered 2012-02-04: 1020 mg via INTRAVENOUS
  Filled 2012-02-04: qty 34

## 2012-02-04 NOTE — Patient Instructions (Signed)
Ferumoxytol injection What is this medicine? FERUMOXYTOL is an iron complex. Iron is used to make healthy red blood cells, which carry oxygen and nutrients throughout the body. This medicine is used to treat iron deficiency anemia in people with chronic kidney disease. This medicine may be used for other purposes; ask your health care provider or pharmacist if you have questions. What should I tell my health care provider before I take this medicine? They need to know if you have any of these conditions: -anemia not caused by low iron levels -high levels of iron in the blood -magnetic resonance imaging (MRI) test scheduled -an unusual or allergic reaction to iron, other medicines, foods, dyes, or preservatives -pregnant or trying to get pregnant -breast-feeding How should I use this medicine? This medicine is for infusion into a vein. It is given by a health care professional in a hospital or clinic setting. Talk to your pediatrician regarding the use of this medicine in children. Special care may be needed. Overdosage: If you think you've taken too much of this medicine contact a poison control center or emergency room at once. Overdosage: If you think you have taken too much of this medicine contact a poison control center or emergency room at once. NOTE: This medicine is only for you. Do not share this medicine with others. What if I miss a dose? It is important not to miss your dose. Call your doctor or health care professional if you are unable to keep an appointment. What may interact with this medicine? This medicine may interact with the following medications: -other iron products This list may not describe all possible interactions. Give your health care provider a list of all the medicines, herbs, non-prescription drugs, or dietary supplements you use. Also tell them if you smoke, drink alcohol, or use illegal drugs. Some items may interact with your medicine. What should I watch  for while using this medicine? Visit your doctor or healthcare professional regularly. Tell your doctor or healthcare professional if your symptoms do not start to get better or if they get worse. You may need blood work done while you are taking this medicine. You may need to follow a special diet. Talk to your doctor. Foods that contain iron include: whole grains/cereals, dried fruits, beans, or peas, leafy green vegetables, and organ meats (liver, kidney). What side effects may I notice from receiving this medicine? Side effects that you should report to your doctor or health care professional as soon as possible: -allergic reactions like skin rash, itching or hives, swelling of the face, lips, or tongue -breathing problems -changes in blood pressure -feeling faint or lightheaded, falls -fever or chills -flushing, sweating, or hot feelings -swelling of the ankles or feet Side effects that usually do not require medical attention (Report these to your doctor or health care professional if they continue or are bothersome.): -diarrhea -headache -nausea, vomiting -stomach pain This list may not describe all possible side effects. Call your doctor for medical advice about side effects. You may report side effects to FDA at 1-800-FDA-1088. Where should I keep my medicine? This drug is given in a hospital or clinic and will not be stored at home. NOTE: This sheet is a summary. It may not cover all possible information. If you have questions about this medicine, talk to your doctor, pharmacist, or health care provider.  2012, [employer]vier/Gold Standard. (11/22/2007 9:48:25 PM) 

## 2012-02-08 ENCOUNTER — Ambulatory Visit: Payer: Medicare Other | Admitting: Hematology & Oncology

## 2012-02-08 ENCOUNTER — Other Ambulatory Visit: Payer: Medicare Other | Admitting: Lab

## 2012-02-14 ENCOUNTER — Other Ambulatory Visit: Payer: Medicare Other | Admitting: Lab

## 2012-02-14 ENCOUNTER — Ambulatory Visit: Payer: Medicare Other | Admitting: Hematology & Oncology

## 2012-03-03 ENCOUNTER — Other Ambulatory Visit (HOSPITAL_BASED_OUTPATIENT_CLINIC_OR_DEPARTMENT_OTHER): Payer: Medicare Other | Admitting: Lab

## 2012-03-03 ENCOUNTER — Ambulatory Visit (HOSPITAL_BASED_OUTPATIENT_CLINIC_OR_DEPARTMENT_OTHER): Payer: Medicare Other | Admitting: Medical

## 2012-03-03 VITALS — BP 130/52 | HR 68 | Temp 98.1°F | Resp 18 | Ht 66.0 in | Wt 150.0 lb

## 2012-03-03 DIAGNOSIS — D61818 Other pancytopenia: Secondary | ICD-10-CM

## 2012-03-03 DIAGNOSIS — D509 Iron deficiency anemia, unspecified: Secondary | ICD-10-CM

## 2012-03-03 LAB — CBC WITH DIFFERENTIAL (CANCER CENTER ONLY)
BASO#: 0 10*3/uL (ref 0.0–0.2)
Eosinophils Absolute: 0.1 10*3/uL (ref 0.0–0.5)
HGB: 9.4 g/dL — ABNORMAL LOW (ref 13.0–17.1)
LYMPH%: 29.2 % (ref 14.0–48.0)
MCH: 30.3 pg (ref 28.0–33.4)
MCV: 91 fL (ref 82–98)
MONO#: 0.4 10*3/uL (ref 0.1–0.9)
Platelets: 73 10*3/uL — ABNORMAL LOW (ref 145–400)
RBC: 3.1 10*6/uL — ABNORMAL LOW (ref 4.20–5.70)
WBC: 3.4 10*3/uL — ABNORMAL LOW (ref 4.0–10.0)

## 2012-03-03 NOTE — Progress Notes (Signed)
Diagnoses: #1.  Iron deficiency anemia.  Current therapy: #1.  IV iron as indicated.  Last dose of IV iron was 02/04/2012.  Interim history: Jamie Romero presents today for an office followup visit.  His daughter accompanies him.  He just received IV iron.  Back on 02/04/2012.  His iron was 37, with 18% saturation and ferritin was 140.  His counts did come up somewhat.  His hemoglobin was 8.3, and today.  It is 9.4.  His platelets have dropped somewhat from 98,000, down to 73,000.  His white count is 3.4.  There was a question whether he may have MDS, this is a possibility.  We are rechecking his iron panel today.  Jamie Romero is quite feeble.  He is 78 years old. His ECOG performance status is a 3.  He does not report any obvious, or abnormal bleeding.  We did check his erythropoietin level, which was 46.9.  He's not deficient in folate, or vitamin B12.  He has a decent appetite.  He denies any nausea, vomiting, diarrhea, constipation, any cough, chest pain, shortness of breath.  He denies any fevers, chills, or night sweats.  A palpable and neuropathy.  He denies any headaches, visual changes, or rashes.  He denies any lower leg swelling.  He is able to get around.  Relatively well with his rolling walker.  He does have a prosthetic leg.  He denies any recent or recurrent infections.  He denies any upper respiratory infections.  He really is asymptomatic.  Review of Systems: Constitutional:Negative for malaise/fatigue, fever, chills, weight loss, diaphoresis, activity change, appetite change, and unexpected weight change.  HEENT: Negative for double vision, blurred vision, visual loss, ear pain, tinnitus, congestion, rhinorrhea, epistaxis sore throat or sinus disease, oral pain/lesion, tongue soreness Respiratory: Negative for cough, chest tightness, shortness of breath, wheezing and stridor.  Cardiovascular: Negative for chest pain, palpitations, leg swelling, orthopnea, PND, DOE or  claudication Gastrointestinal: Negative for nausea, vomiting, abdominal pain, diarrhea, constipation, blood in stool, melena, hematochezia, abdominal distention, anal bleeding, rectal pain, anorexia and hematemesis.  Genitourinary: Negative for dysuria, frequency, hematuria,  Musculoskeletal: Negative for myalgias, back pain, joint swelling, arthralgias and gait problem.  Skin: Negative for rash, color change, pallor and wound.  Neurological:. Negative for dizziness/light-headedness, tremors, seizures, syncope, facial asymmetry, speech difficulty, weakness, numbness, headaches and paresthesias.  Hematological: Negative for adenopathy. Does not bruise/bleed easily.  Psychiatric/Behavioral:  Negative for depression, no loss of interest in normal activity or change in sleep pattern.   Physical Exam: This is an 78 year old, elderly, frail, white gentleman, in no obvious distress Vitals: Temperature 90.1 degrees, pulse 60, respirations 18, blood pressure 130/52.  Weight 150 pounds HEENT reveals a normocephalic, atraumatic skull, no scleral icterus, no oral lesions  Neck is supple without any cervical or supraclavicular adenopathy.  Lungs are clear to auscultation bilaterally. There are no wheezes, rales or rhonci Cardiac is regular rate and rhythm with a normal S1 and S2. There are no murmurs, rubs, or bruits.  Abdomen is soft with good bowel sounds, there is no palpable mass. There is no palpable hepatosplenomegaly. There is no palpable fluid wave.  Musculoskeletal no tenderness of the spine, ribs, or hips.  Extremities there are no clubbing, cyanosis, or edema.  Skin no petechia, purpura or ecchymosis Neurologic is nonfocal.  Laboratory Data: White count 3.4, hemoglobin 9.4, hematocrit 20.3, platelets 73,000, MCV 91  Current Outpatient Prescriptions on File Prior to Visit  Medication Sig Dispense Refill  . acetaminophen (TYLENOL) 325  MG tablet Take 1-2 tablets (325-650 mg total) by mouth every  4 (four) hours as needed.      . Ascorbic Acid (VITAMIN C) 500 MG tablet Take 500 mg by mouth daily.        Marland Kitchen aspirin EC 81 MG tablet Take 1 tablet (81 mg total) by mouth daily.      Marland Kitchen atorvastatin (LIPITOR) 10 MG tablet Take 1 tablet (10 mg total) by mouth daily.  30 tablet  5  . digoxin (LANOXIN) 0.125 MG tablet Take 1 tablet (125 mcg total) by mouth daily.  30 tablet  5  . ferrous sulfate 325 (65 FE) MG tablet Take 1 tablet (325 mg total) by mouth 2 (two) times daily.  60 tablet  5  . furosemide (LASIX) 20 MG tablet Take 1 tablet (20 mg total) by mouth daily.  30 tablet  5  . glipiZIDE (GLUCOTROL) 10 MG tablet Take 0.5 tablets (5 mg total) by mouth daily.  30 tablet  5  . lisinopril (PRINIVIL,ZESTRIL) 5 MG tablet Take 5 mg by mouth daily.       . methocarbamol (ROBAXIN) 500 MG tablet Take 1 tablet (500 mg total) by mouth every 6 (six) hours as needed. For muscle spasms.  30 tablet  0  . Multiple Vitamin (MULTIVITAMIN WITH MINERALS) TABS Take 1 tablet by mouth daily.      . pantoprazole (PROTONIX) 40 MG tablet Take 1 tablet (40 mg total) by mouth 2 (two) times daily before a meal.  60 tablet  4  . ranolazine (RANEXA) 500 MG 12 hr tablet Take 1 tablet (500 mg total) by mouth 2 (two) times daily.  60 tablet  5  . torsemide (DEMADEX) 5 MG tablet 5 mg at bedtime and may repeat dose one time if needed.       . vitamin E 400 UNIT capsule Take 400 Units by mouth daily.        Marland Kitchen zinc sulfate (ZINCATE) 220 MG capsule Take 1 capsule (220 mg total) by mouth daily.  30 capsule  5  . hydrocortisone cream 0.5 % Apply topically 2 (two) times daily.  30 g  1   Assessment/Plan: This is an 78 year old, with the following issues:  #1.  Iron deficiency anemia.  He last received IV iron.  Back on 02/04/2012.  His hemoglobin did come up somewhat.  We are rechecking an iron panel on him today.  In terms of his leukopenia and thrombocytopenia, he really is asymptomatic.  He's not had any recent infections.  He does  not report any obvious, bleeding.  His thrombocytopenia could also indicate iron deficiency as well.  We will continue to monitor his blood counts.  Of note, he is on a slew of medications, which could also cause bone marrow suppression.  #2.  Follow.  Will follow back up with Jamie Romero in 6 weeks, but before then should there be questions or concerns.

## 2012-03-04 LAB — FERRITIN: Ferritin: 288 ng/mL (ref 22–322)

## 2012-03-04 LAB — IRON AND TIBC
%SAT: 21 % (ref 20–55)
Iron: 42 ug/dL (ref 42–165)
UIBC: 157 ug/dL (ref 125–400)

## 2012-03-06 ENCOUNTER — Ambulatory Visit (INDEPENDENT_AMBULATORY_CARE_PROVIDER_SITE_OTHER): Payer: Medicare Other | Admitting: Internal Medicine

## 2012-03-06 ENCOUNTER — Encounter: Payer: Self-pay | Admitting: Internal Medicine

## 2012-03-06 VITALS — BP 130/62 | HR 76 | Wt 150.0 lb

## 2012-03-06 DIAGNOSIS — D509 Iron deficiency anemia, unspecified: Secondary | ICD-10-CM

## 2012-03-06 DIAGNOSIS — E119 Type 2 diabetes mellitus without complications: Secondary | ICD-10-CM

## 2012-03-06 DIAGNOSIS — E785 Hyperlipidemia, unspecified: Secondary | ICD-10-CM

## 2012-03-06 NOTE — Progress Notes (Signed)
Patient ID: Jamie Romero, male   DOB: [birth year], 77 y.o.   MRN: [id number]  patient comes in for followup of multiple medical problems including type 2 diabetes, hyperlipidemia, hypertension. The patient does not check blood sugar or blood pressure at home. The patetient does not follow an exercise or diet program. The patient denies any polyuria, polydipsia.  In the past the patient has gone to diabetic treatment center. Has had recent BKA  Past Medical History  Diagnosis Date  . Valvular heart disease   . GERD (gastroesophageal reflux disease)   . Hypertension   . Hyperlipidemia   . Complication of anesthesia     "had problem extubating him after heart surgery; don't recall why" (12/15/2011)  . Peripheral vascular disease   . Anginal pain     "used to" (12/15/2011)  . Pneumonia 11/2011    "2nd bout w/it" (12/15/2011)  . Type II diabetes mellitus   . Iron (Fe) deficiency anemia   . History of blood transfusion   . Arthritis     "joints" (12/15/2011)  . Diabetic foot ulcer     right foot (12/15/2011)    History   Social History  . Marital Status: Widowed    Spouse Name: N/A    Number of Children: N/A  . Years of Education: N/A   Occupational History  . Not on file.   Social History Main Topics  . Smoking status: Former Smoker -- 3 years    Types: Cigars  . Smokeless tobacco: Current User    Types: Chew     Comment: 12/15/2011 "quit smoking in my early 20's; smoked 50 cigars/wk"  . Alcohol Use: No  . Drug Use: No  . Sexually Active: Not Currently   Other Topics Concern  . Not on file   Social History Narrative  . No narrative on file    Past Surgical History  Procedure Date  . Aortic valve replacement ?1990's    porcine  . Trauma with skull fracture ?late 1950's    "has plate in his head"  . Cardiac valve replacement   . Knee arthroscopy     "took cartilage out; both knees"  . Penectomy 2013    "it had grown over; had to reopen"  . Amputation 12/16/2011   Procedure: AMPUTATION BELOW KNEE;  Surgeon: Toni Arthurs, MD;  Location: [facility] OR;  Service: Orthopedics;  Laterality: Right;    Family History  Problem Relation Age of Onset  . Diabetes Sister   . Cancer Sister   . Cancer Brother     No Known Allergies  Current Outpatient Prescriptions on File Prior to Visit  Medication Sig Dispense Refill  . acetaminophen (TYLENOL) 325 MG tablet Take 1-2 tablets (325-650 mg total) by mouth every 4 (four) hours as needed.      . Ascorbic Acid (VITAMIN C) 500 MG tablet Take 500 mg by mouth daily.        Marland Kitchen aspirin EC 81 MG tablet Take 1 tablet (81 mg total) by mouth daily.      Marland Kitchen atorvastatin (LIPITOR) 10 MG tablet Take 1 tablet (10 mg total) by mouth daily.  30 tablet  5  . digoxin (LANOXIN) 0.125 MG tablet Take 1 tablet (125 mcg total) by mouth daily.  30 tablet  5  . ferrous sulfate 325 (65 FE) MG tablet Take 1 tablet (325 mg total) by mouth 2 (two) times daily.  60 tablet  5  . furosemide (LASIX) 20 MG tablet Take 1  tablet (20 mg total) by mouth daily.  30 tablet  5  . glipiZIDE (GLUCOTROL) 10 MG tablet Take 0.5 tablets (5 mg total) by mouth daily.  30 tablet  5  . hydrocortisone cream 0.5 % Apply topically 2 (two) times daily.  30 g  1  . lisinopril (PRINIVIL,ZESTRIL) 5 MG tablet Take 5 mg by mouth daily.       . methocarbamol (ROBAXIN) 500 MG tablet Take 1 tablet (500 mg total) by mouth every 6 (six) hours as needed. For muscle spasms.  30 tablet  0  . Multiple Vitamin (MULTIVITAMIN WITH MINERALS) TABS Take 1 tablet by mouth daily.      . pantoprazole (PROTONIX) 40 MG tablet Take 1 tablet (40 mg total) by mouth 2 (two) times daily before a meal.  60 tablet  4  . ranolazine (RANEXA) 500 MG 12 hr tablet Take 1 tablet (500 mg total) by mouth 2 (two) times daily.  60 tablet  5  . torsemide (DEMADEX) 5 MG tablet 5 mg at bedtime and may repeat dose one time if needed.       . vitamin E 400 UNIT capsule Take 400 Units by mouth daily.        Marland Kitchen zinc sulfate  (ZINCATE) 220 MG capsule Take 1 capsule (220 mg total) by mouth daily.  30 capsule  5     patient denies chest pain, shortness of breath, orthopnea. Denies lower extremity edema, abdominal pain, change in appetite, change in bowel movements. Patient denies rashes, musculoskeletal complaints. No other specific complaints in a complete review of systems.   BP 130/62  Pulse 76  Wt 150 lb (68.04 kg)  elderly male in no acute distress. HEENT exam atraumatic, normocephalic, neck supple without jugular venous distention. Chest clear to auscultation cardiac exam S1-S2 are regular. Abdominal exam t with bowel sounds, soft and nontender. BKA

## 2012-03-06 NOTE — Assessment & Plan Note (Signed)
Followed by dr. Myna Hidalgo

## 2012-03-08 NOTE — Assessment & Plan Note (Signed)
Stay on meds  goal ldl <100

## 2012-03-08 NOTE — Assessment & Plan Note (Signed)
With PVD Now s/p amputation for gangrene Check labs

## 2012-03-09 ENCOUNTER — Telehealth: Payer: Self-pay | Admitting: *Deleted

## 2012-03-09 NOTE — Telephone Encounter (Addendum)
Message copied by Mirian Capuchin on Thu Mar 09, 2012  3:45 PM ------      Message from: Josph Macho      Created: Tue Mar 07, 2012  7:28 AM       Please call his daughter. Is is better but still borderline. I think one more dose of Feraheme at 1020mg  will help. Please set this up at her in his convenience. Thanks. Cindee Lame This message given to daughter.  Voiced understanding.  Will call back later and schedule an infusion.

## 2012-03-14 ENCOUNTER — Other Ambulatory Visit: Payer: Self-pay | Admitting: *Deleted

## 2012-03-14 ENCOUNTER — Telehealth: Payer: Self-pay | Admitting: Hematology & Oncology

## 2012-03-14 NOTE — Telephone Encounter (Signed)
Asher Muir (daughter in Social worker) called and spoke with RN Amy.  Per RN to sch Iron apt.  Apt was sch for 03/24/12.

## 2012-03-24 ENCOUNTER — Ambulatory Visit: Payer: Medicare Other

## 2012-03-28 ENCOUNTER — Telehealth: Payer: Self-pay | Admitting: Hematology & Oncology

## 2012-03-28 NOTE — Telephone Encounter (Signed)
Pt missed 03-24-12 appointment, per Dr. Myna Hidalgo just add infusion to 1-31 appointment

## 2012-04-06 ENCOUNTER — Ambulatory Visit: Payer: Medicare Other

## 2012-04-06 ENCOUNTER — Ambulatory Visit: Payer: Medicare Other | Admitting: Hematology & Oncology

## 2012-04-06 ENCOUNTER — Other Ambulatory Visit: Payer: Medicare Other | Admitting: Lab

## 2012-04-07 DIAGNOSIS — I739 Peripheral vascular disease, unspecified: Secondary | ICD-10-CM

## 2012-04-07 DIAGNOSIS — Z4789 Encounter for other orthopedic aftercare: Secondary | ICD-10-CM

## 2012-04-07 DIAGNOSIS — IMO0001 Reserved for inherently not codable concepts without codable children: Secondary | ICD-10-CM

## 2012-04-14 ENCOUNTER — Telehealth: Payer: Self-pay | Admitting: Hematology & Oncology

## 2012-04-14 ENCOUNTER — Ambulatory Visit: Payer: Medicare Other

## 2012-04-14 ENCOUNTER — Other Ambulatory Visit: Payer: Medicare Other | Admitting: Lab

## 2012-04-14 ENCOUNTER — Ambulatory Visit: Payer: Medicare Other | Admitting: Hematology & Oncology

## 2012-04-14 NOTE — Telephone Encounter (Signed)
Patient's daughter in law Asher Muir called and cx 04/14/12 apt due to not being able to get patient her and she resch for 05/01/12.  Darl Pikes was notified of cx apt

## 2012-04-15 ENCOUNTER — Other Ambulatory Visit: Payer: Self-pay | Admitting: Internal Medicine

## 2012-05-01 ENCOUNTER — Ambulatory Visit (HOSPITAL_BASED_OUTPATIENT_CLINIC_OR_DEPARTMENT_OTHER): Payer: Medicare Other | Admitting: Hematology & Oncology

## 2012-05-01 ENCOUNTER — Ambulatory Visit: Payer: Medicare Other

## 2012-05-01 ENCOUNTER — Other Ambulatory Visit (HOSPITAL_BASED_OUTPATIENT_CLINIC_OR_DEPARTMENT_OTHER): Payer: Medicare Other | Admitting: Lab

## 2012-05-01 VITALS — BP 130/55 | HR 63 | Temp 97.3°F | Resp 18 | Ht 66.0 in | Wt 146.0 lb

## 2012-05-01 DIAGNOSIS — D509 Iron deficiency anemia, unspecified: Secondary | ICD-10-CM

## 2012-05-01 DIAGNOSIS — D696 Thrombocytopenia, unspecified: Secondary | ICD-10-CM

## 2012-05-01 DIAGNOSIS — D4620 Refractory anemia with excess of blasts, unspecified: Secondary | ICD-10-CM

## 2012-05-01 DIAGNOSIS — L989 Disorder of the skin and subcutaneous tissue, unspecified: Secondary | ICD-10-CM

## 2012-05-01 LAB — IRON AND TIBC
%SAT: 30 % (ref 20–55)
Iron: 66 ug/dL (ref 42–165)
TIBC: 219 ug/dL (ref 215–435)
UIBC: 153 ug/dL (ref 125–400)

## 2012-05-01 LAB — CBC WITH DIFFERENTIAL (CANCER CENTER ONLY)
BASO#: 0 10*3/uL (ref 0.0–0.2)
Eosinophils Absolute: 0.1 10*3/uL (ref 0.0–0.5)
HGB: 9.7 g/dL — ABNORMAL LOW (ref 13.0–17.1)
LYMPH#: 1.2 10*3/uL (ref 0.9–3.3)
MCH: 30.6 pg (ref 28.0–33.4)
MONO#: 0.4 10*3/uL (ref 0.1–0.9)
NEUT#: 2.4 10*3/uL (ref 1.5–6.5)
Platelets: 88 10*3/uL — ABNORMAL LOW (ref 145–400)
RBC: 3.17 10*6/uL — ABNORMAL LOW (ref 4.20–5.70)
WBC: 4 10*3/uL (ref 4.0–10.0)

## 2012-05-01 LAB — RETICULOCYTES (CHCC)
RBC.: 3.21 MIL/uL — ABNORMAL LOW (ref 4.22–5.81)
Retic Ct Pct: 1.6 % (ref 0.4–2.3)

## 2012-05-01 LAB — CHCC SATELLITE - SMEAR

## 2012-05-01 LAB — FERRITIN: Ferritin: 227 ng/mL (ref 22–322)

## 2012-05-01 NOTE — Progress Notes (Signed)
This office note has been dictated.

## 2012-05-01 NOTE — Progress Notes (Signed)
No treatment today.

## 2012-05-02 NOTE — Progress Notes (Signed)
CC:   [name], MD  DIAGNOSIS:  Iron-deficiency anemia.  CURRENT THERAPY:  Patient receiving IV iron as indicated.  INTERIM HISTORY:  Mr. Votaw comes in for followup.  He is doing okay.  He is 79 years old.  He is somewhat feeble.  He does have a right lower leg prosthetic.  He does have occasional leg pain.  His daughter says that he has been checked with circulation in his left leg and this has been okay.  His last iron studies done back in December showed a ferritin of 288 with an iron saturation of 21%.  His total iron was 42.  It is possible he may need to have another dose of iron.  He is not having any obvious bleeding.  He does have a growth on the right temple area.  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  He is going to see an ENT doctor for this.  He has not noted any change in his appetite.  Again, he does eat all that much secondary to his age.  He is on quite a few medications.  PHYSICAL EXAMINATION:  General:  This is an elderly, somewhat feeble- appearing white gentleman in no obvious distress.  Vital signs: Temperature of 97.3, pulse 63, respiratory rate 18, blood pressure 130/55.  Weight is 146.  Head and neck:  Normocephalic, atraumatic skull.  There are no ocular or oral lesions.  There are no palpable cervical or supraclavicular lymph nodes.  Lungs:  Clear to percussion and auscultation bilaterally.  Cardiac:  Regular rate and rhythm with a normal S1 and S2.  He does have a 1/6 systolic ejection murmur. Abdomen:  Soft with good bowel sounds.  There is no palpable abdominal mass.  There is no fluid wave.  There is no palpable hepatosplenomegaly. Extremities:  Right lower leg prosthesis.  There is some mild 1+ edema in his left lower leg.  Skin:  Does show this fungating lesion in the right zygoma region.  It probably measures about 1 x 1 cm.  There is a smaller lesion just inferior to this probably measuring about 0.5 x 0.5 cm.  LABORATORY  STUDIES:  White cell count is 4, hemoglobin 9.7, hematocrit 29.2, platelet count 88,000.  MCV is 92.  IMPRESSION:  Mr. Pullman is an 79 year old white gentleman with what appears to be iron-deficiency anemia.  He does have thrombocytopenia.  I suspect that he also may have a component of myelodysplasia.  However, because of his age and overall feeble condition, I really do not think we need to be too aggressive in pursuing the myelodysplasia diagnosis.  His erythropoietin level is only 47, so if we needed to, we probably could him some Aranesp or Procrit.  We will see what his iron studies are.  I told his daughter that if he needed to have these lesions taken off his right face, I do not have any problems with this.  He is on baby aspirin, so this may or may not need to be stopped.  I told his daughter that if the ENT doctor needs to call me, I would more than happy to talk to him.  We will get Mr. Dolores back to see [place] in 6 weeks.  We can get him back sooner if we need to give him iron.    ______________________________ Josph Macho, M.D. PRE/MEDQ  D:  05/01/2012  T:  05/02/2012  Job:  [id number]

## 2012-05-21 ENCOUNTER — Other Ambulatory Visit: Payer: Self-pay | Admitting: Internal Medicine

## 2012-06-04 NOTE — Progress Notes (Signed)
patient comes in for followup of multiple medical problems including type 2 diabetes, hyperlipidemia, hypertension. The patient does not check blood sugar or blood pressure at home. The patetient does not follow an exercise or diet program. The patient denies any polyuria, polydipsia.  In the past the patient has gone to diabetic treatment center. The patient is tolerating medications  Without difficulty. The patient does admit to medication compliance.    Past Medical History  Diagnosis Date  . Valvular heart disease   . GERD (gastroesophageal reflux disease)   . Hypertension   . Hyperlipidemia   . Complication of anesthesia     "had problem extubating him after heart surgery; don't recall why" (12/15/2011)  . Peripheral vascular disease   . Anginal pain     "used to" (12/15/2011)  . Pneumonia 11/2011    "2nd bout w/it" (12/15/2011)  . Type II diabetes mellitus   . Iron (Fe) deficiency anemia   . History of blood transfusion   . Arthritis     "joints" (12/15/2011)  . Diabetic foot ulcer     right foot (12/15/2011)    History   Social History  . Marital Status: Widowed    Spouse Name: N/A    Number of Children: N/A  . Years of Education: N/A   Occupational History  . Not on file.   Social History Main Topics  . Smoking status: Former Smoker -- 3 years    Types: Cigars  . Smokeless tobacco: Current User    Types: Chew     Comment: 12/15/2011 "quit smoking in my early 20's; smoked 50 cigars/wk"  . Alcohol Use: No  . Drug Use: No  . Sexually Active: Not Currently   Other Topics Concern  . Not on file   Social History Narrative  . No narrative on file    Past Surgical History  Procedure Laterality Date  . Aortic valve replacement  ?1990's    porcine  . Trauma with skull fracture  ?late 1950's    "has plate in his head"  . Cardiac valve replacement    . Knee arthroscopy      "took cartilage out; both knees"  . Penectomy  2013    "it had grown over; had to reopen"   . Amputation  12/16/2011    Procedure: AMPUTATION BELOW KNEE;  Surgeon: Toni Arthurs, MD;  Location: [facility] OR;  Service: Orthopedics;  Laterality: Right;    Family History  Problem Relation Age of Onset  . Diabetes Sister   . Cancer Sister   . Cancer Brother     No Known Allergies  Current Outpatient Prescriptions on File Prior to Visit  Medication Sig Dispense Refill  . acetaminophen (TYLENOL) 325 MG tablet Take 1-2 tablets (325-650 mg total) by mouth every 4 (four) hours as needed.      . Ascorbic Acid (VITAMIN C) 500 MG tablet Take 500 mg by mouth daily.        Marland Kitchen aspirin EC 81 MG tablet Take 1 tablet (81 mg total) by mouth daily.      Marland Kitchen atorvastatin (LIPITOR) 10 MG tablet TAKE ONE TABLET BY MOUTH EVERY DAY  30 tablet  4  . digoxin (LANOXIN) 0.125 MG tablet TAKE ONE TABLET BY MOUTH EVERY DAY  30 tablet  4  . ferrous sulfate 325 (65 FE) MG tablet Take 1 tablet (325 mg total) by mouth 2 (two) times daily.  60 tablet  5  . furosemide (LASIX) 20 MG tablet TAKE  ONE TABLET BY MOUTH EVERY DAY  30 tablet  4  . glipiZIDE (GLUCOTROL) 10 MG tablet TAKE ONE TABLET BY MOUTH EVERY DAY  30 tablet  0  . hydrocortisone cream 0.5 % Apply topically 2 (two) times daily.  30 g  1  . methocarbamol (ROBAXIN) 500 MG tablet Take 1 tablet (500 mg total) by mouth every 6 (six) hours as needed. For muscle spasms.  30 tablet  0  . Multiple Vitamin (MULTIVITAMIN WITH MINERALS) TABS Take 1 tablet by mouth daily.      . pantoprazole (PROTONIX) 40 MG tablet Take 1 tablet (40 mg total) by mouth 2 (two) times daily before a meal.  60 tablet  4  . RANEXA 500 MG 12 hr tablet TAKE ONE TABLET BY MOUTH TWICE DAILY  60 tablet  4  . terazosin (HYTRIN) 10 MG capsule TAKE ONE CAPSULE BY MOUTH AT BEDTIME  30 capsule  4  . vitamin E 400 UNIT capsule Take 400 Units by mouth daily.        Marland Kitchen zinc sulfate (ZINCATE) 220 MG capsule Take 1 capsule (220 mg total) by mouth daily.  30 capsule  5   No current facility-administered  medications on file prior to visit.     patient denies chest pain, shortness of breath, orthopnea. Denies lower extremity edema, abdominal pain, change in appetite, change in bowel movements. Patient denies rashes, musculoskeletal complaints. No other specific complaints in a complete review of systems.   BP 124/60  Pulse 68  Temp(Src) 97.6 F (36.4 C) (Oral)  Wt 149 lb (67.586 kg)  BMI 24.06 kg/m2 elderly male in no acute distress. HEENT exam atraumatic, normocephalic, neck supple without jugular venous distention. Chest clear to auscultation cardiac exam S1-S2 are regular. Abdominal exam overweight with bowel sounds, soft and nontender. Large skin tear without erythema on right arm

## 2012-06-04 NOTE — Assessment & Plan Note (Signed)
Lipid Panel     Component Value Date/Time   CHOL 53 12/06/2011 1032   TRIG 69.0 12/06/2011 1032   HDL 18.20* 12/06/2011 1032   CHOLHDL 3 12/06/2011 1032   VLDL 13.8 12/06/2011 1032   LDLCALC 21 12/06/2011 1032    Controlled except hdl is low-- i will not treat (79 yo)

## 2012-06-04 NOTE — Assessment & Plan Note (Signed)
Lab Results  Component Value Date   HGBA1C 6.4 12/06/2011   previoulsy controlled Check today

## 2012-06-05 ENCOUNTER — Encounter: Payer: Self-pay | Admitting: Internal Medicine

## 2012-06-05 ENCOUNTER — Ambulatory Visit (INDEPENDENT_AMBULATORY_CARE_PROVIDER_SITE_OTHER): Payer: Medicare Other | Admitting: Internal Medicine

## 2012-06-05 VITALS — BP 124/60 | HR 68 | Temp 97.6°F | Wt 149.0 lb

## 2012-06-05 DIAGNOSIS — E785 Hyperlipidemia, unspecified: Secondary | ICD-10-CM

## 2012-06-05 DIAGNOSIS — M9080 Osteopathy in diseases classified elsewhere, unspecified site: Secondary | ICD-10-CM

## 2012-06-05 DIAGNOSIS — I10 Essential (primary) hypertension: Secondary | ICD-10-CM

## 2012-06-05 DIAGNOSIS — L97509 Non-pressure chronic ulcer of other part of unspecified foot with unspecified severity: Secondary | ICD-10-CM

## 2012-06-05 DIAGNOSIS — E119 Type 2 diabetes mellitus without complications: Secondary | ICD-10-CM

## 2012-06-05 DIAGNOSIS — E11621 Type 2 diabetes mellitus with foot ulcer: Secondary | ICD-10-CM

## 2012-06-05 DIAGNOSIS — E1169 Type 2 diabetes mellitus with other specified complication: Secondary | ICD-10-CM

## 2012-06-05 DIAGNOSIS — M869 Osteomyelitis, unspecified: Secondary | ICD-10-CM

## 2012-06-05 LAB — BASIC METABOLIC PANEL
BUN: 25 mg/dL — ABNORMAL HIGH (ref 6–23)
CO2: 28 mEq/L (ref 19–32)
Calcium: 9.2 mg/dL (ref 8.4–10.5)
GFR: 66.43 mL/min (ref >60.00)
Glucose, Bld: 68 mg/dL — ABNORMAL LOW (ref 70–99)
Potassium: 3.9 mEq/L (ref 3.5–5.1)

## 2012-06-05 NOTE — Patient Instructions (Signed)
Change dressing on arm daily until healed Call for any concerns

## 2012-06-06 NOTE — Assessment & Plan Note (Signed)
BP Readings from Last 3 Encounters:  06/05/12 124/60  05/01/12 130/55  03/06/12 130/62  fair control

## 2012-06-12 ENCOUNTER — Ambulatory Visit: Payer: Medicare Other | Admitting: Medical

## 2012-06-12 ENCOUNTER — Other Ambulatory Visit: Payer: Medicare Other | Admitting: Lab

## 2012-06-12 ENCOUNTER — Telehealth: Payer: Self-pay | Admitting: Hematology & Oncology

## 2012-06-12 NOTE — Telephone Encounter (Signed)
Talked with Asher Muir she said pt doesn't want to go anywhere and get anything done. She said she would call back to reschedule

## 2012-06-20 ENCOUNTER — Other Ambulatory Visit: Payer: Self-pay | Admitting: Internal Medicine

## 2012-06-20 ENCOUNTER — Other Ambulatory Visit: Payer: Self-pay | Admitting: Family

## 2012-07-26 ENCOUNTER — Other Ambulatory Visit: Payer: Self-pay | Admitting: Internal Medicine

## 2012-08-27 ENCOUNTER — Other Ambulatory Visit: Payer: Self-pay | Admitting: Internal Medicine

## 2012-09-04 ENCOUNTER — Other Ambulatory Visit: Payer: Self-pay | Admitting: *Deleted

## 2012-09-04 MED ORDER — ZINC SULFATE 220 (50 ZN) MG PO CAPS: 220.0000 mg | ORAL_CAPSULE | Freq: Every day | ORAL | Status: AC

## 2012-09-29 ENCOUNTER — Other Ambulatory Visit: Payer: Self-pay | Admitting: Internal Medicine

## 2012-12-04 ENCOUNTER — Other Ambulatory Visit: Payer: Self-pay | Admitting: Internal Medicine

## 2012-12-05 ENCOUNTER — Encounter: Payer: Self-pay | Admitting: Internal Medicine

## 2012-12-05 ENCOUNTER — Ambulatory Visit (INDEPENDENT_AMBULATORY_CARE_PROVIDER_SITE_OTHER)
Admission: RE | Admit: 2012-12-05 | Discharge: 2012-12-05 | Disposition: A | Discharge status: Home / Self Care | Payer: Medicare Other | Source: Ambulatory Visit | Attending: Internal Medicine | Admitting: Internal Medicine

## 2012-12-05 ENCOUNTER — Ambulatory Visit (INDEPENDENT_AMBULATORY_CARE_PROVIDER_SITE_OTHER): Payer: Medicare Other | Admitting: Internal Medicine

## 2012-12-05 VITALS — BP 130/64 | HR 72 | Temp 97.8°F | Ht 66.0 in | Wt 144.0 lb

## 2012-12-05 DIAGNOSIS — Z23 Encounter for immunization: Secondary | ICD-10-CM

## 2012-12-05 DIAGNOSIS — L97509 Non-pressure chronic ulcer of other part of unspecified foot with unspecified severity: Secondary | ICD-10-CM

## 2012-12-05 DIAGNOSIS — I10 Essential (primary) hypertension: Secondary | ICD-10-CM

## 2012-12-05 DIAGNOSIS — E785 Hyperlipidemia, unspecified: Secondary | ICD-10-CM

## 2012-12-05 DIAGNOSIS — E11621 Type 2 diabetes mellitus with foot ulcer: Secondary | ICD-10-CM

## 2012-12-05 DIAGNOSIS — M25519 Pain in unspecified shoulder: Secondary | ICD-10-CM

## 2012-12-05 DIAGNOSIS — E1159 Type 2 diabetes mellitus with other circulatory complications: Secondary | ICD-10-CM

## 2012-12-05 DIAGNOSIS — M9080 Osteopathy in diseases classified elsewhere, unspecified site: Secondary | ICD-10-CM

## 2012-12-05 DIAGNOSIS — M869 Osteomyelitis, unspecified: Secondary | ICD-10-CM

## 2012-12-05 DIAGNOSIS — E1169 Type 2 diabetes mellitus with other specified complication: Secondary | ICD-10-CM

## 2012-12-05 DIAGNOSIS — M25511 Pain in right shoulder: Secondary | ICD-10-CM

## 2012-12-05 DIAGNOSIS — E119 Type 2 diabetes mellitus without complications: Secondary | ICD-10-CM

## 2012-12-05 LAB — BASIC METABOLIC PANEL
BUN: 23 mg/dL (ref 6–23)
Creatinine, Ser: 1.2 mg/dL (ref 0.4–1.5)
GFR: 63.06 mL/min (ref >60.00)
Potassium: 4.5 mEq/L (ref 3.5–5.1)

## 2012-12-05 LAB — LIPID PANEL
Cholesterol: 76 mg/dL (ref 0–200)
HDL: 30.7 mg/dL — ABNORMAL LOW (ref >39.00)
Total CHOL/HDL Ratio: 2
VLDL: 15.8 mg/dL (ref 0.0–40.0)

## 2012-12-05 LAB — MICROALBUMIN / CREATININE URINE RATIO
Creatinine,U: 94.6 mg/dL
Microalb Creat Ratio: 6.9 mg/g (ref 0.0–30.0)
Microalb, Ur: 6.5 mg/dL — ABNORMAL HIGH (ref 0.0–1.9)

## 2012-12-05 LAB — CBC
MCV: 81.7 fl (ref 78.0–100.0)
RDW: 17.2 % — ABNORMAL HIGH (ref 11.5–14.6)
WBC: 4.4 10*3/uL — ABNORMAL LOW (ref 4.5–10.5)

## 2012-12-05 LAB — HEMOGLOBIN A1C: Hgb A1c MFr Bld: 6.3 % (ref 4.6–6.5)

## 2012-12-05 LAB — HEPATIC FUNCTION PANEL
ALT: 11 U/L (ref 0–53)
AST: 20 U/L (ref 0–37)
Albumin: 3 g/dL — ABNORMAL LOW (ref 3.5–5.2)
Total Protein: 6.4 g/dL (ref 6.0–8.3)

## 2012-12-05 NOTE — Assessment & Plan Note (Signed)
Regular f/u with wound care

## 2012-12-05 NOTE — Assessment & Plan Note (Signed)
BP Readings from Last 3 Encounters:  12/05/12 130/64  06/05/12 124/60  05/01/12 130/55   Fair control continue same meds

## 2012-12-05 NOTE — Assessment & Plan Note (Signed)
Will check la bs today Rx for left DM shoe

## 2012-12-05 NOTE — Assessment & Plan Note (Signed)
Will check  Labs today

## 2012-12-05 NOTE — Progress Notes (Signed)
79 yo complicated patient  patient comes in for followup of multiple medical problems including type 2 diabetes, hyperlipidemia, hypertension. The patient does not check blood sugar or blood pressure at home. The patetient does not follow an exercise or diet program. The patient denies any polyuria, polydipsia.  In the past the patient has gone to diabetic treatment center. The patient is tolerating medications  Without difficulty. The patient does admit to medication compliance.   Wound care- has non healing ulcer- followed by [facility].  Right shoulder pain- not seeing anyone. Pt states that his "shoulder is out of joint 1/2 the time".  Reviewed pmh, psh, sochx, meds  Exam:  Reviewed vitals. Elderly male in no acute distress. HEENT exam atraumatic, normocephalic, neck supple without jugular venous distention. Chest clear to auscultation cardiac exam S1-S2 are regular. Abdominal exam overweight with bowel sounds, soft and nontender. Right bka.  Right shoulder with decreased ROM- check xray

## 2013-03-28 ENCOUNTER — Other Ambulatory Visit: Payer: Medicare Other

## 2013-04-16 ENCOUNTER — Other Ambulatory Visit: Payer: Self-pay | Admitting: Internal Medicine

## 2013-05-04 ENCOUNTER — Telehealth: Payer: Self-pay | Admitting: Internal Medicine

## 2013-05-04 NOTE — Telephone Encounter (Signed)
ok 

## 2013-05-04 NOTE — Telephone Encounter (Signed)
[facility] in [place] has ordered hospice for the pt. They wanted to confirm Dr Leanne Chang will be attending MD. Elicia Lamp dr swords is pt's pcp.

## 2013-05-07 NOTE — Telephone Encounter (Signed)
Verbal order given to Diane

## 2013-06-04 ENCOUNTER — Ambulatory Visit: Payer: Medicare Other | Admitting: Internal Medicine

## 2013-08-03 ENCOUNTER — Ambulatory Visit: Payer: Medicare Other | Admitting: Internal Medicine

## 2014-01-13 DEATH — deceased

## 2014-04-02 IMAGING — CR DG CHEST 1V PORT
1 series · 1 of 1 positions shown · non-contrast
Comparison: 12/15/2011.

CLINICAL DATA: 89-year-old male status post below-the-knee
amputation.  Productive cough.

PORTABLE CHEST - 1 VIEW

[AP]
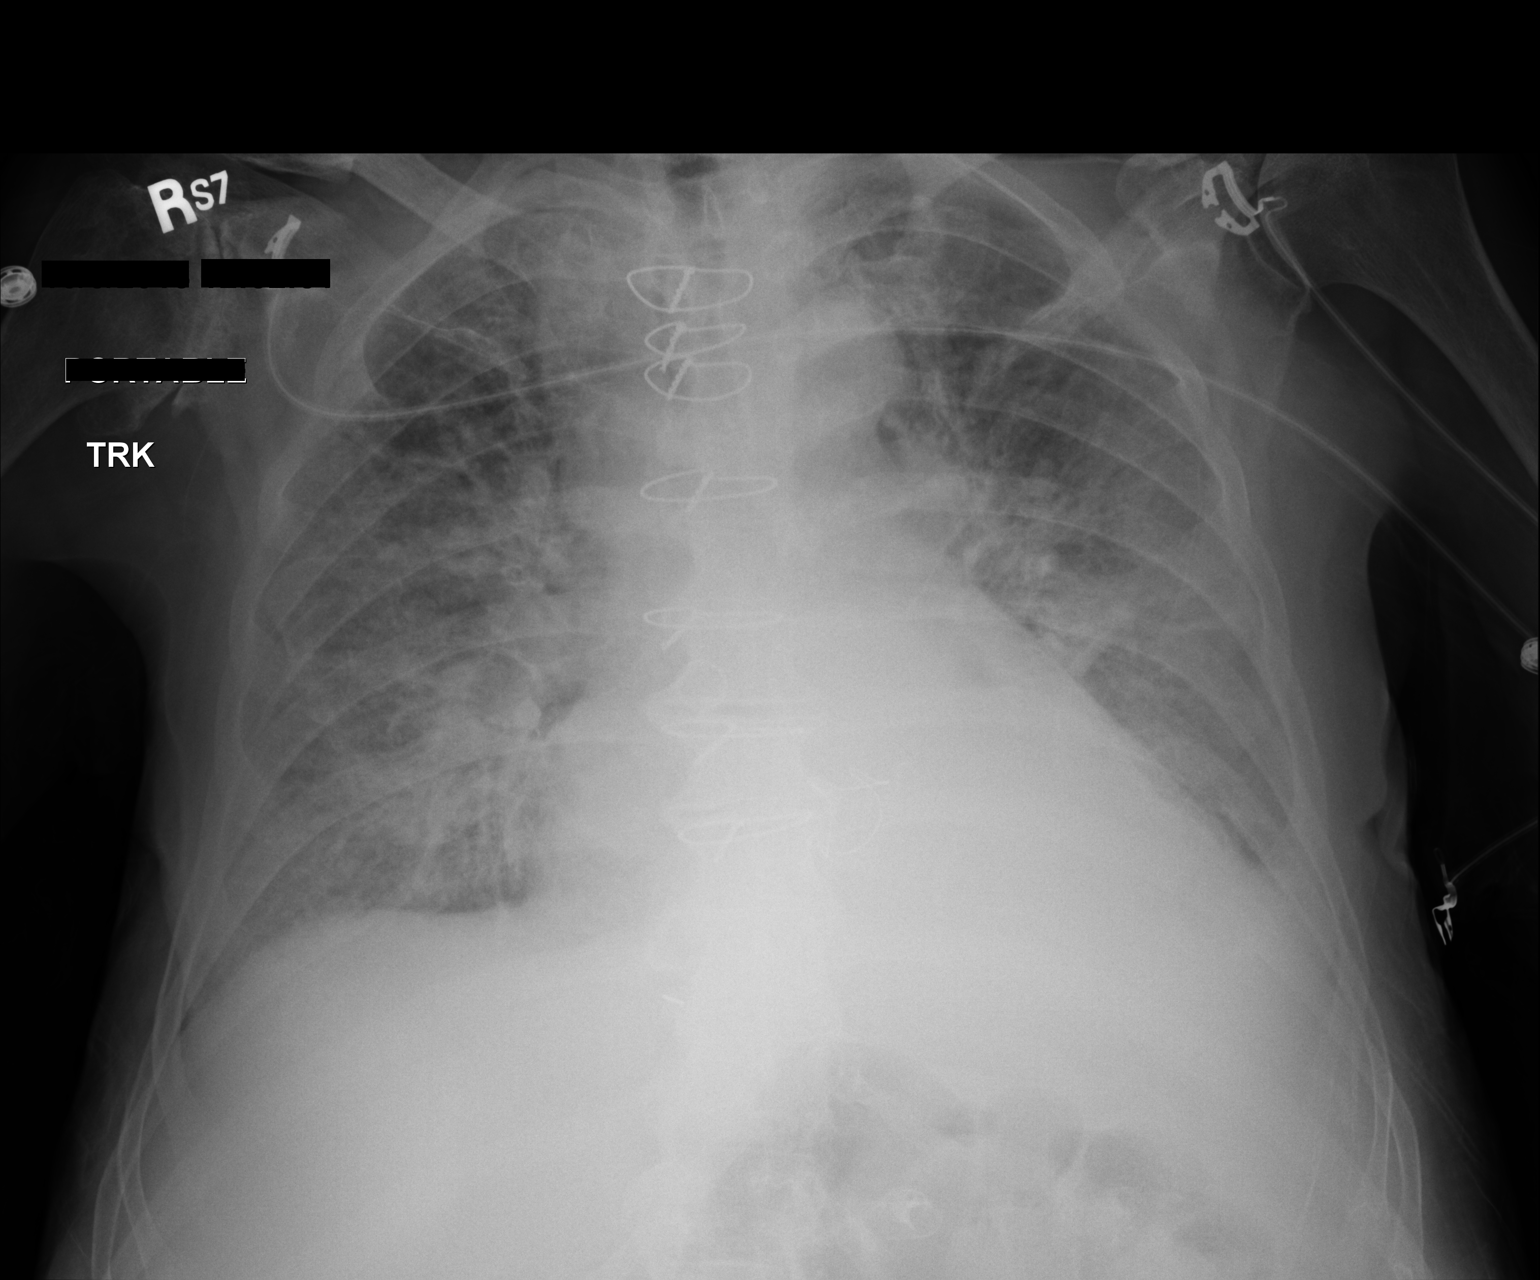

[1 of 1 positions shown; findings below may reference images not displayed]

FINDINGS: Semi upright AP portable view 5686 hours.  Diffuse
increased interstitial markings.  Confluent retrocardiac opacity.
Probable small left pleural effusion.  Stable cardiac size and
mediastinal contours.  Sequelae of CABG.  No pneumothorax.
IMPRESSION: Acute pulmonary edema suspected with small left pleural effusion
and retrocardiac atelectasis.
